# Patient Record
Sex: Female | Born: 1955 | Race: White | Hispanic: No | Marital: Single | Smoking: Current every day smoker
Health system: Southern US, Community
[De-identification: ages and names within clinical notes are randomized; demographics above are authoritative.]

## PROBLEM LIST (undated history)

## (undated) DIAGNOSIS — F419 Anxiety disorder, unspecified: Secondary | ICD-10-CM

## (undated) DIAGNOSIS — I4891 Unspecified atrial fibrillation: Secondary | ICD-10-CM

## (undated) DIAGNOSIS — I499 Cardiac arrhythmia, unspecified: Secondary | ICD-10-CM

## (undated) DIAGNOSIS — I10 Essential (primary) hypertension: Secondary | ICD-10-CM

## (undated) DIAGNOSIS — R519 Headache, unspecified: Secondary | ICD-10-CM

## (undated) DIAGNOSIS — E7800 Pure hypercholesterolemia, unspecified: Secondary | ICD-10-CM

## (undated) DIAGNOSIS — J449 Chronic obstructive pulmonary disease, unspecified: Secondary | ICD-10-CM

## (undated) DIAGNOSIS — T8859XA Other complications of anesthesia, initial encounter: Secondary | ICD-10-CM

## (undated) DIAGNOSIS — F32A Depression, unspecified: Secondary | ICD-10-CM

## (undated) HISTORY — DX: Unspecified atrial fibrillation: I48.91

## (undated) HISTORY — PX: TONSILLECTOMY: SUR1361

## (undated) HISTORY — PX: TUBAL LIGATION: SHX77

## (undated) HISTORY — DX: Anxiety disorder, unspecified: F41.9

## (undated) HISTORY — DX: Depression, unspecified: F32.A

## (undated) HISTORY — DX: Pure hypercholesterolemia, unspecified: E78.00

## (undated) HISTORY — PX: HERNIA REPAIR: SHX51

## (undated) HISTORY — DX: Essential (primary) hypertension: I10

## (undated) MED FILL — Iron Sucrose Inj 20 MG/ML (Fe Equiv): INTRAVENOUS | Qty: 10 | Status: AC

---

## 2015-07-27 ENCOUNTER — Encounter: Payer: Self-pay | Admitting: Emergency Medicine

## 2015-07-27 DIAGNOSIS — F17200 Nicotine dependence, unspecified, uncomplicated: Secondary | ICD-10-CM | POA: Insufficient documentation

## 2015-07-27 DIAGNOSIS — H73892 Other specified disorders of tympanic membrane, left ear: Secondary | ICD-10-CM | POA: Insufficient documentation

## 2015-07-27 DIAGNOSIS — H6122 Impacted cerumen, left ear: Secondary | ICD-10-CM | POA: Insufficient documentation

## 2015-07-27 LAB — URINALYSIS COMPLETE WITH MICROSCOPIC (ARMC ONLY)
BACTERIA UA: NONE SEEN
Bilirubin Urine: NEGATIVE
GLUCOSE, UA: NEGATIVE mg/dL
HGB URINE DIPSTICK: NEGATIVE
KETONES UR: NEGATIVE mg/dL
LEUKOCYTES UA: NEGATIVE
NITRITE: NEGATIVE
Protein, ur: NEGATIVE mg/dL
SPECIFIC GRAVITY, URINE: 1.016 (ref 1.005–1.030)
pH: 7 (ref 5.0–8.0)

## 2015-07-27 LAB — BASIC METABOLIC PANEL
ANION GAP: 8 (ref 5–15)
BUN: 18 mg/dL (ref 6–20)
CALCIUM: 9.5 mg/dL (ref 8.9–10.3)
CO2: 26 mmol/L (ref 22–32)
Chloride: 106 mmol/L (ref 101–111)
Creatinine, Ser: 0.95 mg/dL (ref 0.44–1.00)
GFR calc Af Amer: >60 mL/min (ref >60)
Glucose, Bld: 109 mg/dL — ABNORMAL HIGH (ref 65–99)
POTASSIUM: 3.9 mmol/L (ref 3.5–5.1)
SODIUM: 140 mmol/L (ref 135–145)

## 2015-07-27 LAB — CBC
HEMATOCRIT: 41.9 % (ref 35.0–47.0)
HEMOGLOBIN: 14.7 g/dL (ref 12.0–16.0)
MCH: 30.6 pg (ref 26.0–34.0)
MCHC: 35 g/dL (ref 32.0–36.0)
MCV: 87.4 fL (ref 80.0–100.0)
Platelets: 242 10*3/uL (ref 150–440)
RBC: 4.8 MIL/uL (ref 3.80–5.20)
RDW: 13.7 % (ref 11.5–14.5)
WBC: 10.5 10*3/uL (ref 3.6–11.0)

## 2015-07-27 LAB — GLUCOSE, CAPILLARY: GLUCOSE-CAPILLARY: 113 mg/dL — AB (ref 65–99)

## 2015-07-27 NOTE — ED Notes (Signed)
Pt arrived to the ED accompanied by her daughter for complaints of dizziness, near syncope episode and ear pain. Pt states that this symptoms have been going on for the last 2 years and has never gotten it checked. Pt thinks that all this time there was something in her ear that might be contributing to the symptoms; Pt has not seen a Dr for it. Pt is AOx4 in no apparent distress.

## 2015-07-28 ENCOUNTER — Emergency Department
Admission: EM | Admit: 2015-07-28 | Discharge: 2015-07-28 | Disposition: A | Discharge status: Home / Self Care | Payer: Self-pay | Attending: Emergency Medicine | Admitting: Emergency Medicine

## 2015-07-28 DIAGNOSIS — R42 Dizziness and giddiness: Secondary | ICD-10-CM

## 2015-07-28 DIAGNOSIS — H6122 Impacted cerumen, left ear: Secondary | ICD-10-CM

## 2015-07-28 NOTE — Discharge Instructions (Signed)
Please seek medical attention for any high fevers, chest pain, shortness of breath, change in behavior, persistent vomiting, bloody stool or any other new or concerning symptoms. ° ° °Dizziness °Dizziness is a common problem. It makes you feel unsteady or lightheaded. You may feel like you are about to pass out (faint). Dizziness can lead to injury if you stumble or fall. Anyone can get dizzy, but dizziness is more common in older adults. This condition can be caused by a number of things, including: °· Medicines. °· Dehydration. °· Illness. °HOME CARE °Following these instructions may help with your condition: °Eating and Drinking °· Drink enough fluid to keep your pee (urine) clear or pale yellow. This helps to keep you from getting dehydrated. Try to drink more clear fluids, such as water. °· Do not drink alcohol. °· Limit how much caffeine you drink or eat if told by your doctor. °· Limit how much salt you drink or eat if told by your doctor. °Activity °· Avoid making quick movements. °¨ When you stand up from sitting in a chair, steady yourself until you feel okay. °¨ In the morning, first sit up on the side of the bed. When you feel okay, stand slowly while you hold onto something. Do this until you know that your balance is fine. °· Move your legs often if you need to stand in one place for a long time. Tighten and relax your muscles in your legs while you are standing. °· Do not drive or use heavy machinery if you feel dizzy. °· Avoid bending down if you feel dizzy. Place items in your home so that they are easy for you to reach without leaning over. °Lifestyle °· Do not use any tobacco products, including cigarettes, chewing tobacco, or electronic cigarettes. If you need help quitting, ask your doctor. °· Try to lower your stress level, such as with yoga or meditation. Talk with your doctor if you need help. °General Instructions °· Watch your dizziness for any changes. °· Take medicines only as told by  your doctor. Talk with your doctor if you think that your dizziness is caused by a medicine that you are taking. °· Tell a friend or a family member that you are feeling dizzy. If he or she notices any changes in your behavior, have this person call your doctor. °· Keep all follow-up visits as told by your doctor. This is important. °GET HELP IF: °· Your dizziness does not go away. °· Your dizziness or light-headedness gets worse. °· You feel sick to your stomach (nauseous). °· You have trouble hearing. °· You have new symptoms. °· You are unsteady on your feet or you feel like the room is spinning. °GET HELP RIGHT AWAY IF: °· You throw up (vomit) or have diarrhea and are unable to eat or drink anything. °· You have trouble: °¨ Talking. °¨ Walking. °¨ Swallowing. °¨ Using your arms, hands, or legs. °· You feel generally weak. °· You are not thinking clearly or you have trouble forming sentences. It may take a friend or family member to notice this. °· You have: °¨ Chest pain. °¨ Pain in your belly (abdomen). °¨ Shortness of breath. °¨ Sweating. °· Your vision changes. °· You are bleeding. °· You have a headache. °· You have neck pain or a stiff neck. °· You have a fever. °  °This information is not intended to replace advice given to you by your health care provider. Make sure you discuss any questions you   have with your health care provider. °  °Document Released: 12/22/2010 Document Revised: 05/19/2014 Document Reviewed: 12/29/2013 °Elsevier Interactive Patient Education ©2016 [employer] ° °

## 2015-07-28 NOTE — ED Provider Notes (Signed)
[facility] Emergency Department Provider Note    ____________________________________________  Time seen: ~0040  I have reviewed the triage vital signs and the nursing notes.   HISTORY  Chief Complaint Near Syncope and Otalgia   History limited by: Not Limited   HPI Kristin Haas is a 60 y.o. female who presents to the emergency department today because of concerns for dizziness, possible foreign body in her ear, near syncope episodes. The patient states that she's been having dizziness for a couple of years. She thinks that it all started after she got water in her ear. 6 months later she started having vertigo type symptoms. She has had to lie on the floor when they've gotten very bad. She says that she did pass out roughly 2 years ago and again roughly a year and a half ago. For the past 3 days she has been having increasing dizzy spells. She has not had any chest pain associated with this. She has not seen any medical provider for these symptoms. The patient's daughter has been trying to clean out her left ear. They're concerned that there is a black foreign body in that ear.   History reviewed. No pertinent past medical history.  There are no active problems to display for this patient.   Past Surgical History  Procedure Laterality Date  . Hernia repair      No current outpatient prescriptions on file.  Allergies Codeine  History reviewed. No pertinent family history.  Social History Social History  Substance Use Topics  . Smoking status: Heavy Tobacco Smoker  . Smokeless tobacco: None  . Alcohol Use: Yes    Review of Systems  Constitutional: Negative for fever. Cardiovascular: Negative for chest pain. Respiratory: Negative for shortness of breath. Gastrointestinal: Negative for abdominal pain, vomiting and diarrhea. Neurological: Negative for headaches, focal weakness or numbness.  10-point ROS otherwise  negative.  ____________________________________________   PHYSICAL EXAM:  VITAL SIGNS: ED Triage Vitals  Enc Vitals Group     BP 07/27/15 2041 143/83 mmHg     Pulse Rate 07/27/15 2041 87     Resp 07/27/15 2041 20     Temp 07/27/15 2041 98.3 F (36.8 C)     Temp Source 07/27/15 2041 Oral     SpO2 07/27/15 2041 97 %     Weight 07/27/15 2041 145 lb (65.772 kg)     Height 07/27/15 2041 5\' 5"  (1.651 m)     Head Cir --      Peak Flow --      Pain Score 07/27/15 2055 0   Constitutional: Alert and oriented. Well appearing and in no distress. Eyes: Conjunctivae are normal. PERRL. Normal extraocular movements. ENT   Head: Normocephalic and atraumatic.      Ears: Right ear wnl. Left ear with moderate amount of soft wax towards the tympanic membrane. Some erythema around the tympanic membrane. No obvious bulging or fluid behind the membrane. No debris in external canal.    Nose: No congestion/rhinnorhea.   Mouth/Throat: Mucous membranes are moist.   Neck: No stridor. Hematological/Lymphatic/Immunilogical: No cervical lymphadenopathy. Cardiovascular: Normal rate, regular rhythm.  No murmurs, rubs, or gallops. Respiratory: Normal respiratory effort without tachypnea nor retractions. Breath sounds are clear and equal bilaterally. No wheezes/rales/rhonchi. Gastrointestinal: Soft and nontender. No distention. There is no CVA tenderness. Genitourinary: Deferred Musculoskeletal: Normal range of motion in all extremities. No joint effusions.  No lower extremity tenderness nor edema. Neurologic:  Normal speech and language. No gross focal  neurologic deficits are appreciated.  Skin:  Skin is warm, dry and intact. No rash noted. Psychiatric: Mood and affect are normal. Speech and behavior are normal. Patient exhibits appropriate insight and judgment.  ____________________________________________    LABS (pertinent positives/negatives)  Na 140 K 3.9 Glu 109 WBC 10.5 Hgb 14.7 UA  wnl ____________________________________________   EKG   I, Phineas Semen, attending physician, personally viewed and interpreted this EKG  EKG Time: 2040 Rate: 83 Rhythm: normal sinus rhythm  Axis: normal Intervals: qtc 411 QRS: narrow ST changes: no st elevation Impression: normal ekg  ____________________________________________    RADIOLOGY  None  ____________________________________________   PROCEDURES  Procedure(s) performed: Cerumen debridement  Critical Care performed: No  Ceruminosis is noted on the left side.  Wax is removed by manual debridement. Instructions for home care to prevent wax buildup are given.  ____________________________________________   INITIAL IMPRESSION / ASSESSMENT AND PLAN / ED COURSE  Pertinent labs & imaging results that were available during my care of the patient were reviewed by me and considered in my medical decision making (see chart for details).  Patient presented to the emergency department today with multiple year history of vertigo type symptoms, concern for foreign body in the left ear. Exam just showed some wax in that ear. I did remove some wax and did not observe any foreign body. The tympanic membrane is mildly erythematous which is probably likely related to recent attempts to clean it. Had a discussion with the patient about these syncopal episodes. The last one was a urinary half ago. Think likely she would best be benefited from establishing care with primary care doctor. There was a troponin ordered from triage however given lack of chest pain I do not think that ACS would explain the patient's symptoms, especially given long history of symptoms. Will discharge with primary care and ENT follow-up.  ____________________________________________   FINAL CLINICAL IMPRESSION(S) / ED DIAGNOSES  Final diagnoses:  Dizziness  Cerumen debris on tympanic membrane of left ear     Note: This dictation was prepared  with Dragon dictation. Any transcriptional errors that result from this process are unintentional    Phineas Semen, MD 07/28/15 [id number]

## 2015-09-06 ENCOUNTER — Encounter: Payer: Self-pay | Admitting: Emergency Medicine

## 2015-09-06 ENCOUNTER — Emergency Department: Payer: Self-pay

## 2015-09-06 ENCOUNTER — Observation Stay
Admission: EM | Admit: 2015-09-06 | Discharge: 2015-09-07 | Disposition: A | Discharge status: Home / Self Care | Payer: Self-pay | Attending: Internal Medicine | Admitting: Internal Medicine

## 2015-09-06 ENCOUNTER — Ambulatory Visit
Admission: EM | Admit: 2015-09-06 | Discharge: 2015-09-06 | Disposition: A | Discharge status: Home / Self Care | Payer: Self-pay | Attending: Family Medicine | Admitting: Family Medicine

## 2015-09-06 DIAGNOSIS — Z885 Allergy status to narcotic agent status: Secondary | ICD-10-CM | POA: Insufficient documentation

## 2015-09-06 DIAGNOSIS — R51 Headache: Secondary | ICD-10-CM | POA: Insufficient documentation

## 2015-09-06 DIAGNOSIS — R55 Syncope and collapse: Secondary | ICD-10-CM | POA: Insufficient documentation

## 2015-09-06 DIAGNOSIS — Z8249 Family history of ischemic heart disease and other diseases of the circulatory system: Secondary | ICD-10-CM | POA: Insufficient documentation

## 2015-09-06 DIAGNOSIS — R002 Palpitations: Principal | ICD-10-CM | POA: Diagnosis present

## 2015-09-06 DIAGNOSIS — Z79899 Other long term (current) drug therapy: Secondary | ICD-10-CM | POA: Insufficient documentation

## 2015-09-06 DIAGNOSIS — R232 Flushing: Secondary | ICD-10-CM

## 2015-09-06 DIAGNOSIS — I10 Essential (primary) hypertension: Secondary | ICD-10-CM | POA: Insufficient documentation

## 2015-09-06 DIAGNOSIS — I639 Cerebral infarction, unspecified: Secondary | ICD-10-CM

## 2015-09-06 DIAGNOSIS — I071 Rheumatic tricuspid insufficiency: Secondary | ICD-10-CM | POA: Insufficient documentation

## 2015-09-06 DIAGNOSIS — R42 Dizziness and giddiness: Secondary | ICD-10-CM | POA: Insufficient documentation

## 2015-09-06 DIAGNOSIS — F17210 Nicotine dependence, cigarettes, uncomplicated: Secondary | ICD-10-CM | POA: Insufficient documentation

## 2015-09-06 DIAGNOSIS — F410 Panic disorder [episodic paroxysmal anxiety] without agoraphobia: Secondary | ICD-10-CM | POA: Insufficient documentation

## 2015-09-06 HISTORY — DX: Flushing: R23.2

## 2015-09-06 HISTORY — DX: Palpitations: R00.2

## 2015-09-06 LAB — CBC
HCT: 42.1 % (ref 35.0–47.0)
HEMOGLOBIN: 14.8 g/dL (ref 12.0–16.0)
MCH: 30.9 pg (ref 26.0–34.0)
MCHC: 35.1 g/dL (ref 32.0–36.0)
MCV: 88 fL (ref 80.0–100.0)
Platelets: 216 10*3/uL (ref 150–440)
RBC: 4.79 MIL/uL (ref 3.80–5.20)
RDW: 13.6 % (ref 11.5–14.5)
WBC: 10.2 10*3/uL (ref 3.6–11.0)

## 2015-09-06 LAB — BASIC METABOLIC PANEL
ANION GAP: 7 (ref 5–15)
BUN: 13 mg/dL (ref 6–20)
CHLORIDE: 106 mmol/L (ref 101–111)
CO2: 27 mmol/L (ref 22–32)
CREATININE: 0.6 mg/dL (ref 0.44–1.00)
Calcium: 9.2 mg/dL (ref 8.9–10.3)
GFR calc non Af Amer: >60 mL/min (ref >60)
GLUCOSE: 127 mg/dL — AB (ref 65–99)
Potassium: 4 mmol/L (ref 3.5–5.1)
Sodium: 140 mmol/L (ref 135–145)

## 2015-09-06 LAB — LIPID PANEL
CHOL/HDL RATIO: 4.6 ratio
CHOLESTEROL: 222 mg/dL — AB (ref 0–200)
HDL: 48 mg/dL (ref >40)
LDL Cholesterol: 145 mg/dL — ABNORMAL HIGH (ref 0–99)
TRIGLYCERIDES: 144 mg/dL (ref <150)
VLDL: 29 mg/dL (ref 0–40)

## 2015-09-06 LAB — URINALYSIS COMPLETE WITH MICROSCOPIC (ARMC ONLY)
Bacteria, UA: NONE SEEN
Bilirubin Urine: NEGATIVE
GLUCOSE, UA: NEGATIVE mg/dL
Hgb urine dipstick: NEGATIVE
Ketones, ur: NEGATIVE mg/dL
Leukocytes, UA: NEGATIVE
Nitrite: NEGATIVE
PROTEIN: NEGATIVE mg/dL
Specific Gravity, Urine: 1.009 (ref 1.005–1.030)
pH: 6 (ref 5.0–8.0)

## 2015-09-06 LAB — GLUCOSE, CAPILLARY: Glucose-Capillary: 88 mg/dL (ref 65–99)

## 2015-09-06 LAB — TROPONIN I: Troponin I: <0.03 ng/mL (ref <0.03)

## 2015-09-06 MED ORDER — NICOTINE 21 MG/24HR TD PT24
21.0000 mg | MEDICATED_PATCH | Freq: Every day | TRANSDERMAL | Status: DC
  Administered 2015-09-06 – 2015-09-07 (×2): 21 mg via TRANSDERMAL
  Filled 2015-09-06 (×2): qty 1

## 2015-09-06 MED ORDER — PSEUDOEPHEDRINE HCL ER 120 MG PO TB12
120.0000 mg | ORAL_TABLET | Freq: Every day | ORAL | Status: DC
  Filled 2015-09-06 (×3): qty 1

## 2015-09-06 MED ORDER — ASPIRIN 81 MG PO CHEW
81.0000 mg | CHEWABLE_TABLET | Freq: Once | ORAL | Status: AC
  Administered 2015-09-06: 81 mg via ORAL
  Filled 2015-09-06: qty 1

## 2015-09-06 MED ORDER — HEPARIN SODIUM (PORCINE) 5000 UNIT/ML IJ SOLN
5000.0000 [IU] | Freq: Three times a day (TID) | INTRAMUSCULAR | Status: DC
  Administered 2015-09-06 – 2015-09-07 (×2): 5000 [IU] via SUBCUTANEOUS
  Filled 2015-09-06 (×2): qty 1

## 2015-09-06 MED ORDER — SODIUM CHLORIDE 0.9% FLUSH
3.0000 mL | Freq: Two times a day (BID) | INTRAVENOUS | Status: DC
  Administered 2015-09-06 – 2015-09-07 (×2): 3 mL via INTRAVENOUS

## 2015-09-06 NOTE — Progress Notes (Signed)
Pt admitted to rm 242. Family at bedside. She is painfree. Understands to call for assist to br.oriented to room.

## 2015-09-06 NOTE — ED Notes (Signed)
Pt denies any chest pain, passed FAST stroke exam.

## 2015-09-06 NOTE — ED Provider Notes (Signed)
[facility] ____________________________________________  Time seen: Approximately 1300 PM  I have reviewed the triage vital signs and the nursing notes.   HISTORY  Chief Complaint Near Syncope   HPI Kristin Haas is a 60 y.o. female presenting with daughter at bedside for report of intermittent episodes of near syncope. Patient describes these episodes reoccurring over the last 2 years, usually 2-3 episodes per month however reports yesterday episodes were near continuous. Patient further describes these episodes as a flushed feeling in her head, heart starts to race and has a fluttering feeling in her chest, and then her vision starts to go black. Patient reports she has had a history of passing out with these episodes twice before with last being in 2016. Patient reports that she normally can sit herself down, breathing and the complaints would eventually resolve. Patient states she has been unable to determine the trigger for these events. Patient questions if she is having anxiety attacks but is unable to associate triggers. Reports mild headache at this time.   Patient reports she has since tried to eliminate caffeine as well as gluten to see if this is with associated without positive results. Patient reports when she checks her blood pressure during an episode her blood pressure was elevated with 197/94 yesterday. Patient reports that she has been seen once in the emergency room for similar complaint approximately one month ago but reports she has not been evaluated otherwise. Patient reports she is currently working to be established by her primary care physician but has not been seen by her primary physician in several years.  Denies any fall or head trauma. Denies any recent sickness, fevers, hospitalizations. Reports has continued working recently. Patient reports she has not had any in the same episodes today. Patient denies any complaints at this current time. Denies  any recent medication changes.   History reviewed. No pertinent past medical history.  There are no active problems to display for this patient.   Past Surgical History:  Procedure Laterality Date  . HERNIA REPAIR    . TONSILLECTOMY    . TUBAL LIGATION     No current facility-administered medications for this encounter.   Current Outpatient Prescriptions:  .  DimenhyDRINATE (DRAMAMINE PO), Take by mouth., Disp: , Rfl:  .  ibuprofen (ADVIL,MOTRIN) 600 MG tablet, Take 600 mg by mouth every 6 (six) hours as needed., Disp: , Rfl:   Allergies Codeine  family history. Daughter: anxiety, arrhythmia   Social History Social History  Substance Use Topics  . Smoking status: Heavy Tobacco Smoker    Packs/day: 1.00    Types: Cigarettes  . Smokeless tobacco: Never Used  . Alcohol use Yes    Review of Systems Constitutional: No fever/chills Eyes: As above. ENT: No sore throat. Cardiovascular: Denies chest pain. Respiratory: Denies shortness of breath. Gastrointestinal: No abdominal pain.  No nausea, no vomiting.  No diarrhea.  No constipation. Genitourinary: Negative for dysuria. Musculoskeletal: Negative for back pain. Skin: Negative for rash. Neurological: Negative for  focal weakness or numbness.  10-point ROS otherwise negative.  ____________________________________________   PHYSICAL EXAM:  VITAL SIGNS: ED Triage Vitals  Enc Vitals Group     BP 09/06/15 1141 (!) 155/90     Pulse Rate 09/06/15 1141 83     Resp 09/06/15 1141 18     Temp 09/06/15 1141 98.2 F (36.8 C)     Temp Source 09/06/15 1141 Oral     SpO2 09/06/15 1141 100 %  Weight 09/06/15 1141 150 lb (68 kg)     Height 09/06/15 1141 5\' 5"  (1.651 m)     Head Circumference --      Peak Flow --      Pain Score 09/06/15 1144 0     Pain Loc --      Pain Edu? --      Excl. in GC? --     Constitutional: Alert and oriented. Well appearing and in no acute distress. Eyes: Conjunctivae are normal.  PERRL. EOMI. ENT      Head: Normocephalic and atraumatic.      Ears: Left: cerumen impaction, nontender, no erythema. Right: nontender, no erythema, normal TM.       Nose: No congestion/rhinnorhea.      Mouth/Throat: Mucous membranes are moist.Oropharynx non-erythematous. Neck: No stridor. Supple without meningismus.  Hematological/Lymphatic/Immunilogical: No cervical lymphadenopathy. Cardiovascular: Normal rate, regular rhythm. Grossly normal heart sounds.  Good peripheral circulation. Respiratory: Normal respiratory effort without tachypnea nor retractions. Breath sounds are clear and equal bilaterally. No wheezes/rales/rhonchi.. Gastrointestinal: Soft and nontender. No distention.  Musculoskeletal:  Nontender with normal range of motion in all extremities. No midline cervical, thoracic or lumbar tenderness to palpation. Bilateral pedal pulses equal and easily palpated. Neurologic:  Normal speech and language. No gross focal neurologic deficits are appreciated. Speech is normal. No gait instability. 5/5 strength to bilateral upper and lower extremities.  Skin:  Skin is warm, dry and intact. No rash noted. Psychiatric: Mood and affect are normal. Speech and behavior are normal. Patient exhibits appropriate insight and judgment   ___________________________________________   LABS (all labs ordered are listed, but only abnormal results are displayed)  Labs Reviewed  GLUCOSE, CAPILLARY  CBG MONITORING, ED   ____________________________________________  EKG  ED ECG REPORT I, Renford DillsLindsey Jenica Costilow, the attending provider, personally viewed and interpreted this ECG.   Date: 09/06/2015  EKG Time: 1144  Rate: 75  Rhythm: sinus rhythm with possible premature atrial complexes  Axis: normal  Intervals:none  ST&T Change: none  ____________________________________________  RADIOLOGY  No results found. ____________________________________________   PROCEDURES Procedures    INITIAL  IMPRESSION / ASSESSMENT AND PLAN / ED COURSE  Pertinent labs & imaging results that were available during my care of the patient were reviewed by me and considered in my medical decision making (see chart for details).  Overall well-appearing patient. Presenting with daughter at bedside for evaluation of complaints that has been intermittent over the last 2 years that has not yet been followed by a primary physician. Patient expresses concern that yesterday symptoms were much more frequent in their occurrence. Declines any previous imaging such as CT head or cardiac evaluation. No focal neurological deficits. Discussed in detail with patient and daughter as symptoms could have various etiologies sources, unable to determine at this time however discuss multiple differentials such as neurologic, cardiac, anxiety or panic attack or other with patient and family. As patient expresses concern of yesterday's events occurring more frequently, recommend this time for patient to be further evaluated in emergency room of her choice including likely CT head and cardiac evaluation. Patient alert and oriented with decisional capacity and states that her daughter will drive her to the ER, declines EMS transportation. Patient stable at the time of discharge and transfer. Called Noreene LarssonJill  RN charge nurse at [facility] and report was given.  Discussed follow up with Primary care physician this week. Discussed follow up and return parameters including no resolution or any worsening concerns. Patient verbalized understanding  and agreed to plan.   ____________________________________________   FINAL CLINICAL IMPRESSION(S) / ED DIAGNOSES  Final diagnoses:  Near syncope     Discharge Medication List as of 09/06/2015 12:24 PM      Note: This dictation was prepared with Dragon dictation along with smaller phrase technology. Any transcriptional errors that result from this process are unintentional.    Clinical  Course       Renford Dills, NP 09/06/15 Rickey Primus

## 2015-09-06 NOTE — ED Triage Notes (Signed)
Pt says she has episodes of felling head full and like something coming over her and like going to pass out since 2015--but in past several days these episodes have been increasing in frequency--had 18 yesterday.  [facility] and they sent her here--they did ekg and finger stick there.

## 2015-09-06 NOTE — ED Triage Notes (Addendum)
Patient is complaining of having near passing out episodes. She states everything goes black and she has been checking her blood pressure when it happens and it has been elevated, she has a blood rushing to head feeling, also feels like she is being choked. Patient says after these episodes she has a really bad headache.

## 2015-09-06 NOTE — ED Provider Notes (Signed)
[facility] Emergency Department Provider Note ____________________________________________   I have reviewed the triage vital signs and the triage nursing note.  HISTORY  Chief Complaint Near Syncope   Historian Patient and daughter  HPI Kristin Haas is a 60 y.o. female who is brought in today essentially by her daughter's request because she has had several episodes of near syncope. It sounds like she's had 2 or 3 episodes over the past year, but had 2 episodes this weekend, including one today. Symptoms seemed to start when she becomes anxious, and start with a pressure in the chest and palpitations which turns to flushing across the face and then her vision becomes black and she nearly passes out. This has happened while driving. She denies weakness or numbness. She had a headache after this episode for several hours. She's not been evaluated for this because she is new to the area and does not have a primary care physician yet.  Currently no chest pain or headache. No weakness or numbness.    History reviewed. No pertinent past medical history.  There are no active problems to display for this patient.   Past Surgical History:  Procedure Laterality Date  . HERNIA REPAIR    . TONSILLECTOMY    . TUBAL LIGATION      Prior to Admission medications   Medication Sig Start Date End Date Taking? Authorizing Provider  pseudoephedrine (SUDAFED) 120 MG 12 hr tablet Take 120 mg by mouth daily.   Yes Historical Provider, MD  DimenhyDRINATE (DRAMAMINE PO) Take by mouth.    Historical Provider, MD  ibuprofen (ADVIL,MOTRIN) 600 MG tablet Take 600 mg by mouth every 6 (six) hours as needed.    Historical Provider, MD    Allergies  Allergen Reactions  . Codeine Nausea And Vomiting    No family history on file.  Social History Social History  Substance Use Topics  . Smoking status: Heavy Tobacco Smoker    Packs/day: 1.00    Types: Cigarettes  . Smokeless  tobacco: Never Used  . Alcohol use Yes    Review of Systems  Constitutional: Negative for fever. Eyes: Negative for visual changes. ENT: Negative for sore throat. Cardiovascular: Negative for chest pain.  Positive for palpitations and flushing which are now gone. Respiratory: Negative for shortness of breath. Gastrointestinal: Negative for abdominal pain, vomiting and diarrhea. Genitourinary: Negative for dysuria. Musculoskeletal: Negative for back pain. Skin: Negative for rash. Neurological: Negative for headache. 10 point Review of Systems otherwise negative ____________________________________________   PHYSICAL EXAM:  VITAL SIGNS: ED Triage Vitals  Enc Vitals Group     BP 09/06/15 1320 (!) 161/89     Pulse Rate 09/06/15 1320 78     Resp 09/06/15 1320 16     Temp 09/06/15 1320 98.1 F (36.7 C)     Temp Source 09/06/15 1320 Oral     SpO2 09/06/15 1320 100 %     Weight 09/06/15 1321 150 lb (68 kg)     Height 09/06/15 1321 5\' 6"  (1.676 m)     Head Circumference --      Peak Flow --      Pain Score 09/06/15 1322 3     Pain Loc --      Pain Edu? --      Excl. in GC? --      Constitutional: Alert and oriented. Well appearing and in no distress. HEENT   Head: Normocephalic and atraumatic.      Eyes: Conjunctivae  are normal. PERRL. Normal extraocular movements.      Ears:         Nose: No congestion/rhinnorhea.   Mouth/Throat: Mucous membranes are moist.   Neck: No stridor. Cardiovascular/Chest: Normal rate, regular rhythm.  No murmurs, rubs, or gallops. Respiratory: Normal respiratory effort without tachypnea nor retractions. Breath sounds are clear and equal bilaterally. No wheezes/rales/rhonchi. Gastrointestinal: Soft. No distention, no guarding, no rebound. Nontender.    Genitourinary/rectal:Deferred Musculoskeletal: Nontender with normal range of motion in all extremities. No joint effusions.  No lower extremity tenderness.  No edema. Neurologic:   Cranial nerves II through X intact. No slurred speech or aphasia. Normal speech and language. No gross or focal neurologic deficits are appreciated. Skin:  Skin is warm, dry and intact. No rash noted. Psychiatric: Mood and affect are normal. Speech and behavior are normal. Patient exhibits appropriate insight and judgment.  She does seem somewhat anxious.  ____________________________________________   EKG I, Governor Rooksebecca Eliezer Khawaja, MD, the attending physician have personally viewed and interpreted all ECGs.  70 bpm. Normal sinus rhythm. Incomplete right bundle branch block. Normal axis. Nonspecific ST and T-wave ____________________________________________  LABS (pertinent positives/negatives)  Labs Reviewed  BASIC METABOLIC PANEL - Abnormal; Notable for the following:       Result Value   Glucose, Bld 127 (*)    All other components within normal limits  URINALYSIS COMPLETEWITH MICROSCOPIC ([facility] ONLY) - Abnormal; Notable for the following:    Color, Urine STRAW (*)    APPearance CLEAR (*)    Squamous Epithelial / LPF 0-5 (*)    All other components within normal limits  CBC  TROPONIN I    ____________________________________________  RADIOLOGY All Xrays were viewed by me. Imaging interpreted by Radiologist.  Ct head without contrast:  IMPRESSION: No intracranial hemorrhage, mass effect or midline shift. No acute cortical infarction. No mass lesion is noted on this unenhanced scan. Axial image 9 there is focal area of decreased attenuation in right basal ganglia measures 9 mm. This may be due to ischemia of indeterminate age. If recent ischemia is suspected further evaluation with MRI with diffusion imaging could be performed.  These results were called by telephone at the time of interpretation on 09/06/2015 at 3:07 pm to Dr. Shaune PollackLord , who verbally acknowledged these results. __________________________________________  PROCEDURES  Procedure(s) performed: None  Critical Care  performed: None  ____________________________________________   ED COURSE / ASSESSMENT AND PLAN  Pertinent labs & imaging results that were available during my care of the patient were reviewed by me and considered in my medical decision making (see chart for details).  Unclear etiology of episodes where the patient has palpitations, flushing, elevated blood pressure, dimming of vision and near syncope which are now increasing in frequency.  Consider cardiac versus neurologic.  However, no specific cardiac abnormality is or high risk features, similarly intact neurologic exam.  She does state that she is under a lot of stress, and the symptoms do set him to the car when she's having stress. She recently moved here due to separation, and started a new job.   Head CT result was called to me by the radiologist with an area concerning for possible stroke, potentially subacute and recommending MRI.  When I went back to discuss the head CT result and the plan for hospital admission and MRI, daughter states that actually her mother has had issues with mixing up words this weekend, instead of saying cheese she said shoes. The patient said that she stated  she was because they had been having a conversation talking about shoes.    CONSULTATIONS:   Hospitalist, Dr. Desiree HaneVachani, for admission.   Patient / Family / Caregiver informed of clinical course, medical decision-making process, and agree with plan.    ___________________________________________   FINAL CLINICAL IMPRESSION(S) / ED DIAGNOSES   Final diagnoses:  Near syncope  Heart palpitations              Note: This dictation was prepared with Dragon dictation. Any transcriptional errors that result from this process are unintentional    Governor Rooksebecca Dillon [place], MD 09/06/15 [phone]

## 2015-09-06 NOTE — Progress Notes (Signed)
Resident requested Nicotine patch.This Clinical research associatewriter called MD for order. Order received for one 21 mg patch every 24 hrs. Patch applied at 21:30 to right shoulder.

## 2015-09-06 NOTE — H&P (Signed)
[employer] [facility] [place] [facility]   PATIENT NAME: Kristin Haas    MR#:  [id number]  DATE OF BIRTH:  Dec 05, 1955  DATE OF ADMISSION:  09/06/2015  PRIMARY CARE PHYSICIAN: [facility]   REQUESTING/REFERRING PHYSICIAN: lord  CHIEF COMPLAINT:   Chief Complaint  Patient presents with  . Near Syncope    HISTORY OF PRESENT ILLNESS:  Kristin Haas  is a 60 y.o. female with a known history of No known medical issue as she does not go to a doctor anytime, is having episodes of palpitation, headache, flushing and excessive sweating, feeling dizzy and blacked out with these episodes. This episodes started 2 years ago but initially it was once in every few months and it was lasting just for 1-2 minutes. For last 3-4 days it started coming multiple times per day and so finally see decided to come to emergency room today for having further workup. She denies any urinary symptoms, denies any drug use or excessive caffeinated drinks.  PAST MEDICAL HISTORY:  History reviewed. No pertinent past medical history. Patient does not go to doctors so she is not aware about any medical history. PAST SURGICAL HISTORY:   Past Surgical History:  Procedure Laterality Date  . HERNIA REPAIR    . TONSILLECTOMY    . TUBAL LIGATION      SOCIAL HISTORY:   Social History  Substance Use Topics  . Smoking status: Heavy Tobacco Smoker    Packs/day: 1.00    Types: Cigarettes  . Smokeless tobacco: Never Used  . Alcohol use Yes    FAMILY HISTORY:   Family History  Problem Relation Age of Onset  . Hypertension Father   . Hypertension Sister     DRUG ALLERGIES:   Allergies  Allergen Reactions  . Codeine Nausea And Vomiting    REVIEW OF SYSTEMS:   Review of Systems  Constitutional: Negative for fever and weight loss.  HENT: Negative for congestion, ear pain, hearing loss and sore throat.   Eyes: Negative for blurred vision and double vision.  Respiratory: Negative  for cough, sputum production and shortness of breath.   Cardiovascular: Positive for palpitations. Negative for chest pain, orthopnea and leg swelling.  Gastrointestinal: Negative for abdominal pain, diarrhea, nausea and vomiting.  Genitourinary: Negative for dysuria and frequency.  Musculoskeletal: Negative for myalgias.  Skin: Negative for rash.  Neurological: Positive for headaches. Negative for dizziness, speech change, focal weakness, seizures, loss of consciousness and weakness.  Psychiatric/Behavioral: Negative for depression.    MEDICATIONS AT HOME:   Prior to Admission medications   Medication Sig Start Date End Date Taking? Authorizing Provider  pseudoephedrine (SUDAFED) 120 MG 12 hr tablet Take 120 mg by mouth daily.   Yes Historical Provider, MD  DimenhyDRINATE (DRAMAMINE PO) Take by mouth.    Historical Provider, MD  ibuprofen (ADVIL,MOTRIN) 600 MG tablet Take 600 mg by mouth every 6 (six) hours as needed.    Historical Provider, MD      VITAL SIGNS:  Blood pressure 136/86, pulse 62, temperature 98.1 F (36.7 C), temperature source Oral, resp. rate 18, height 5\' 6"  (1.676 m), weight 68 kg (150 lb), SpO2 99 %.  PHYSICAL EXAMINATION:  Physical Exam  GENERAL:  60 y.o.-year-old patient lying in the bed with no acute distress.  EYES: Pupils equal, round, reactive to light and accommodation. No scleral icterus. Extraocular muscles intact.  HEENT: Head atraumatic, normocephalic. Oropharynx and nasopharynx clear.  NECK:  Supple, no jugular venous distention. No  thyroid enlargement, no tenderness.  LUNGS: Normal breath sounds bilaterally, no wheezing, rales,rhonchi or crepitation. No use of accessory muscles of respiration.  CARDIOVASCULAR: S1, S2 normal. No murmurs, rubs, or gallops.  ABDOMEN: Soft, nontender, nondistended. Bowel sounds present. No organomegaly or mass.  EXTREMITIES: No pedal edema, cyanosis, or clubbing.  NEUROLOGIC: Cranial nerves II through XII are intact.  Muscle strength 5/5 in all extremities. Sensation intact. Gait not checked.  PSYCHIATRIC: The patient is alert and oriented x 3.  SKIN: No obvious rash, lesion, or ulcer.   LABORATORY PANEL:   CBC  Recent Labs Lab 09/06/15 1324  WBC 10.2  HGB 14.8  HCT 42.1  PLT 216   ------------------------------------------------------------------------------------------------------------------  Chemistries   Recent Labs Lab 09/06/15 1324  NA 140  K 4.0  CL 106  CO2 27  GLUCOSE 127*  BUN 13  CREATININE 0.60  CALCIUM 9.2   ------------------------------------------------------------------------------------------------------------------  Cardiac Enzymes  Recent Labs Lab 09/06/15 1324  TROPONINI <0.03   ------------------------------------------------------------------------------------------------------------------  RADIOLOGY:  Ct Head Wo Contrast  Result Date: 09/06/2015 CLINICAL DATA:  Near syncope EXAM: CT HEAD WITHOUT CONTRAST TECHNIQUE: Contiguous axial images were obtained from the base of the skull through the vertex without intravenous contrast. COMPARISON:  None. FINDINGS: Brain: No intracranial hemorrhage, mass effect or midline shift. Mild cerebral atrophy. No acute cortical infarction. No mass lesion is noted on this unenhanced scan. Axial image 9 there is focal area of decreased attenuation in right basal ganglia measures 9 mm. This may be due to ischemia of indeterminate age. If recent ischemia is suspected further evaluation with MRI with diffusion imaging could be performed. Vascular: Atherosclerotic calcifications of carotid siphon. Skull: No skull fracture is noted. Sinuses/Orbits: Paranasal sinuses and mastoid air cells are unremarkable. Other: None IMPRESSION: No intracranial hemorrhage, mass effect or midline shift. No acute cortical infarction. No mass lesion is noted on this unenhanced scan. Axial image 9 there is focal area of decreased attenuation in right basal  ganglia measures 9 mm. This may be due to ischemia of indeterminate age. If recent ischemia is suspected further evaluation with MRI with diffusion imaging could be performed. These results were called by telephone at the time of interpretation on 09/06/2015 at 3:07 pm to Dr. Shaune PollackLord , who verbally acknowledged these results. Electronically Signed   By: Natasha MeadLiviu  Pop M.D.   On: 09/06/2015 15:08      IMPRESSION AND PLAN:   * Episode at palpitation, dizziness, excessive sweating, headache and hypertension   Highly suggestive of pheochromocytoma. Or it may be panic attacks.   We will check for urinary and plasma metanephrines, get endocrinology consult.  * Hypertension   Currently wait for further diagnosis and treatment options depend on the results of above.   As she is not going to any doctor will also check lipid panel and hemoglobin A1c.  * Possibility of stroke as per CT scan results   I highly doubt this presentation is secondary to a stroke but a CT scan is questionable I will do an MRI on the brain.  * Active smoking   Counseled to quit smoking for 4 minutes and offered nicotine patch.       All the records are reviewed and case discussed with ED provider. Management plans discussed with the patient, family and they are in agreement.  CODE STATUS: full.  TOTAL TIME TAKING CARE OF THIS PATIENT: 45 minutes.    Altamese DillingVACHHANI, Sommer Spickard M.D on 09/06/2015 at 5:28 PM  Between 7am to 6pm -  Pager - [phone]  After 6pm go to [email or web address] - Social research officer, governmentpassword [email or web address] [email or web address]  [facility]  Office  [phone]  CC: Primary care physician; [facility]   Note: This dictation was prepared with Dragon dictation along with smaller phrase technology. Any transcriptional errors that result from this process are unintentional.

## 2015-09-06 NOTE — Discharge Instructions (Signed)
Go directly to ER  

## 2015-09-07 ENCOUNTER — Observation Stay: Payer: Self-pay

## 2015-09-07 ENCOUNTER — Observation Stay (HOSPITAL_BASED_OUTPATIENT_CLINIC_OR_DEPARTMENT_OTHER)
Admit: 2015-09-07 | Discharge: 2015-09-07 | Disposition: A | Discharge status: Home / Self Care | Payer: Self-pay | Attending: Internal Medicine | Admitting: Internal Medicine

## 2015-09-07 DIAGNOSIS — R002 Palpitations: Secondary | ICD-10-CM

## 2015-09-07 LAB — CBC
HEMATOCRIT: 40.3 % (ref 35.0–47.0)
Hemoglobin: 14.2 g/dL (ref 12.0–16.0)
MCH: 30.9 pg (ref 26.0–34.0)
MCHC: 35.3 g/dL (ref 32.0–36.0)
MCV: 87.5 fL (ref 80.0–100.0)
Platelets: 201 10*3/uL (ref 150–440)
RBC: 4.6 MIL/uL (ref 3.80–5.20)
RDW: 13.4 % (ref 11.5–14.5)
WBC: 7.9 10*3/uL (ref 3.6–11.0)

## 2015-09-07 LAB — BASIC METABOLIC PANEL
Anion gap: 5 (ref 5–15)
BUN: 13 mg/dL (ref 6–20)
CALCIUM: 8.7 mg/dL — AB (ref 8.9–10.3)
CO2: 26 mmol/L (ref 22–32)
Chloride: 110 mmol/L (ref 101–111)
Creatinine, Ser: 0.61 mg/dL (ref 0.44–1.00)
GFR calc Af Amer: >60 mL/min (ref >60)
GLUCOSE: 106 mg/dL — AB (ref 65–99)
POTASSIUM: 4.2 mmol/L (ref 3.5–5.1)
SODIUM: 141 mmol/L (ref 135–145)

## 2015-09-07 LAB — TSH: TSH: 4.06 u[IU]/mL (ref 0.350–4.500)

## 2015-09-07 LAB — ECHOCARDIOGRAM COMPLETE
HEIGHTINCHES: 66 in
WEIGHTICAEL: 2345.6 [oz_av]

## 2015-09-07 LAB — HEMOGLOBIN A1C: HEMOGLOBIN A1C: 5.3 % (ref 4.0–6.0)

## 2015-09-07 MED ORDER — NICOTINE 21 MG/24HR TD PT24: 21.0000 mg | MEDICATED_PATCH | Freq: Every day | TRANSDERMAL | 0 refills | Status: DC

## 2015-09-07 MED ORDER — ACETAMINOPHEN 325 MG PO TABS
650.0000 mg | ORAL_TABLET | Freq: Four times a day (QID) | ORAL | Status: DC | PRN
  Administered 2015-09-07: 650 mg via ORAL
  Filled 2015-09-07: qty 2

## 2015-09-07 NOTE — Discharge Summary (Signed)
[employer] [facility] [place] [facility]   PATIENT NAME: Kristin Haas    MR#:  [id number]  DATE OF BIRTH:  1955-04-24  DATE OF ADMISSION:  09/06/2015 ADMITTING PHYSICIAN: Altamese DillingVaibhavkumar Vachhani, MD  DATE OF DISCHARGE: 09/07/2015  PRIMARY CARE PHYSICIAN: No primary care provider on file.    ADMISSION DIAGNOSIS:  Heart palpitations [R00.2] CVA (cerebral infarction) [I63.9] Near syncope [R55]  DISCHARGE DIAGNOSIS:  Principal Problem:   Flushing Active Problems:   Palpitation   SECONDARY DIAGNOSIS:  History reviewed. No pertinent past medical history.  HOSPITAL COURSE:    60 year old female with a history of tobacco dependence who presents with symptoms of flushing, palpitation and near syncope which has occurred on several occasions over the past few years.  1. Palpitations with flushing: Telemetry showed no acute abnormal rhythm. She underwent echocardiogram which essentially was normal. TSH was within normal limits. She would benefit from outpatient follow-up with endocrinology if this continues. She was initially started on 24-hour urine collection for VMA and metanephrines to evaluate for Pheo, however this would best be done in an outpatient setting. Differential also includes panic attacks. She was also elevated initially however her blood pressure was less than 140/90 during most of her hospital stay. I also discontinue Sudafed. She will be scheduled to see Dr Welton FlakesKhan, cardiology as an outpatient and would benefit from cardiac monitoring.   2. Tobacco dependence Patient was encouraged to stop smoking. Patient counseled for 3 minutes.    DISCHARGE CONDITIONS AND DIET:  Stable for discharge home on regular diet  CONSULTS OBTAINED:    DRUG ALLERGIES:   Allergies  Allergen Reactions  . Codeine Nausea And Vomiting    DISCHARGE MEDICATIONS:   Current Discharge Medication List    START taking these medications   Details  nicotine (NICODERM CQ -  DOSED IN MG/24 HOURS) 21 mg/24hr patch Place 1 patch (21 mg total) onto the skin daily. Qty: 28 patch, Refills: 0      CONTINUE these medications which have NOT CHANGED   Details  ibuprofen (ADVIL,MOTRIN) 600 MG tablet Take 600 mg by mouth every 6 (six) hours as needed.      STOP taking these medications     pseudoephedrine (SUDAFED) 120 MG 12 hr tablet      DimenhyDRINATE (DRAMAMINE PO)               Today   CHIEF COMPLAINT:  Doing well this am no symptoms of flushing, dizziness, near syncope.   VITAL SIGNS:  Blood pressure 132/65, pulse 65, temperature 97.9 F (36.6 C), temperature source Oral, resp. rate 15, height 5\' 6"  (1.676 m), weight 66.5 kg (146 lb 9.6 oz), SpO2 96 %.   REVIEW OF SYSTEMS:  Review of Systems  Constitutional: Negative.  Negative for chills, fever and malaise/fatigue.  HENT: Negative.  Negative for ear discharge, ear pain, hearing loss, nosebleeds and sore throat.   Eyes: Negative.  Negative for blurred vision and pain.  Respiratory: Negative.  Negative for cough, hemoptysis, shortness of breath and wheezing.   Cardiovascular: Negative.  Negative for chest pain, palpitations and leg swelling.  Gastrointestinal: Negative.  Negative for abdominal pain, blood in stool, diarrhea, nausea and vomiting.  Genitourinary: Negative.  Negative for dysuria.  Musculoskeletal: Negative.  Negative for back pain.  Skin: Negative.   Neurological: Negative for dizziness, tremors, speech change, focal weakness, seizures and headaches.  Endo/Heme/Allergies: Negative.  Does not bruise/bleed easily.  Psychiatric/Behavioral: Negative.  Negative for depression, hallucinations and  suicidal ideas.     PHYSICAL EXAMINATION:  GENERAL:  60 y.o.-year-old patient lying in the bed with no acute distress.  NECK:  Supple, no jugular venous distention. No thyroid enlargement, no tenderness.  LUNGS: Normal breath sounds bilaterally, no wheezing, rales,rhonchi  No use of  accessory muscles of respiration.  CARDIOVASCULAR: S1, S2 normal. No murmurs, rubs, or gallops.  ABDOMEN: Soft, non-tender, non-distended. Bowel sounds present. No organomegaly or mass.  EXTREMITIES: No pedal edema, cyanosis, or clubbing.  PSYCHIATRIC: The patient is alert and oriented x 3.  SKIN: No obvious rash, lesion, or ulcer.   DATA REVIEW:   CBC  Recent Labs Lab 09/07/15 0426  WBC 7.9  HGB 14.2  HCT 40.3  PLT 201    Chemistries   Recent Labs Lab 09/07/15 0426  NA 141  K 4.2  CL 110  CO2 26  GLUCOSE 106*  BUN 13  CREATININE 0.61  CALCIUM 8.7*    Cardiac Enzymes  Recent Labs Lab 09/06/15 1324  TROPONINI <0.03    Microbiology Results  @[email or web address] @  RADIOLOGY:  Ct Head Wo Contrast  Result Date: 09/06/2015 CLINICAL DATA:  Near syncope EXAM: CT HEAD WITHOUT CONTRAST TECHNIQUE: Contiguous axial images were obtained from the base of the skull through the vertex without intravenous contrast. COMPARISON:  None. FINDINGS: Brain: No intracranial hemorrhage, mass effect or midline shift. Mild cerebral atrophy. No acute cortical infarction. No mass lesion is noted on this unenhanced scan. Axial image 9 there is focal area of decreased attenuation in right basal ganglia measures 9 mm. This may be due to ischemia of indeterminate age. If recent ischemia is suspected further evaluation with MRI with diffusion imaging could be performed. Vascular: Atherosclerotic calcifications of carotid siphon. Skull: No skull fracture is noted. Sinuses/Orbits: Paranasal sinuses and mastoid air cells are unremarkable. Other: None IMPRESSION: No intracranial hemorrhage, mass effect or midline shift. No acute cortical infarction. No mass lesion is noted on this unenhanced scan. Axial image 9 there is focal area of decreased attenuation in right basal ganglia measures 9 mm. This may be due to ischemia of indeterminate age. If recent ischemia is suspected further evaluation with MRI with  diffusion imaging could be performed. These results were called by telephone at the time of interpretation on 09/06/2015 at 3:07 pm to Dr. Shaune Pollack , who verbally acknowledged these results. Electronically Signed   By: Natasha Mead M.D.   On: 09/06/2015 15:08   Mr Brain Wo Contrast  Result Date: 09/07/2015 CLINICAL DATA:  60 year old female with episodes of headache, dizziness, diaphoresis, palpitations. Possible right basal ganglia lesion on noncontrast head CT. Initial encounter. EXAM: MRI HEAD WITHOUT CONTRAST TECHNIQUE: Multiplanar, multiecho pulse sequences of the brain and surrounding structures were obtained without intravenous contrast. COMPARISON:  Noncontrast head CT 09/06/2015. FINDINGS: Cerebral volume is within normal limits for age. No restricted diffusion to suggest acute infarction. No midline shift, mass effect, evidence of mass lesion, ventriculomegaly, extra-axial collection or acute intracranial hemorrhage. Cervicomedullary junction and pituitary are within normal limits. Major intracranial vascular flow voids are preserved. The round 9 mm area in the inferior right basal ganglia is re- demonstrated and is isointense to CSF on all images. This is a small perivascular space (normal variant). Wallace Cullens and white matter signal is within normal limits throughout the brain. No chronic cerebral blood products. Visible internal auditory structures appear normal. Visualized paranasal sinuses and mastoids are stable and well pneumatized. Negative visualized cervical spine. Visualized bone marrow signal is within normal limits. Negative  orbit and scalp soft tissues. IMPRESSION: Normal for age MRI appearance of the brain. Noncontrast head CT finding is a small perivascular space (normal variant). Electronically Signed   By: Odessa FlemingH  Hall M.D.   On: 09/07/2015 09:32      Management plans discussed with the patient and she is in agreement. Stable for discharge home  Patient should follow up with  pcp endocrine CODE STATUS:     Code Status Orders        Start     Ordered   09/06/15 1817  Full code  Continuous     09/06/15 1816    Code Status History    Date Active Date Inactive Code Status Order ID Comments User Context   09/06/2015  6:16 PM 09/07/2015 12:07 PM Full Code [id number]  Altamese DillingVaibhavkumar Vachhani, MD Inpatient      TOTAL TIME TAKING CARE OF THIS PATIENT: 36 minutes.    Note: This dictation was prepared with Dragon dictation along with smaller phrase technology. Any transcriptional errors that result from this process are unintentional.  Kennedi Lizardo M.D on 09/07/2015 at 12:21 PM  Between 7am to 6pm - Pager - [phone] After 6pm go to [email or web address] - Social research officer, governmentpassword [email or web address]  [facility]  Office  [phone]  CC: Primary care physician; No primary care provider on file.

## 2015-09-07 NOTE — Progress Notes (Signed)
Orders to DC pt to home with no HH needs. Pt sp[oke to MD about concerns and follow up appts. Pt and daughter informed to schedule follow up visits. Discharge instructions given to pt and daughter with verbal acknowledgment of understanding. IV and tele removed. Pt escorted off unit via wheelchair by nursing staff.

## 2015-09-07 NOTE — Progress Notes (Signed)
*  PRELIMINARY RESULTS* Echocardiogram 2D Echocardiogram has been performed.  Cristela BlueHege, Raina Sole 09/07/2015, 12:59 PM

## 2015-09-07 NOTE — Progress Notes (Signed)
   [facility] [address] [place], [place] [address]  September 07, 2015  Patient:  Kristin Haas Date of Birth: 1955-08-07 Date of Visit:  09/06/2015  To Whom it May Concern:  Please excuse Kristin Haas from work from 09/06/2015 until 09/07/15 as she was admitted to the [facility] for medical treatment and has been receiving appropriate care. She may return to work on 09/10/15, sooner if she feels she is able to return sooner than this date.      Please don't hesitate to contact me with questions or concerns by calling  [phone] and asking them to page me directly.   Marge Duncansave Kristin Dorian, MD

## 2015-09-16 LAB — METANEPHRINES, PLASMA
METANEPHRINE FREE: 30 pg/mL (ref 0–62)
Normetanephrine, Free: 81 pg/mL (ref 0–145)

## 2020-02-04 ENCOUNTER — Observation Stay (HOSPITAL_COMMUNITY)
Admission: EM | Admit: 2020-02-04 | Discharge: 2020-02-07 | Disposition: A | Discharge status: Home / Self Care | Payer: Medicare Other | Attending: Internal Medicine | Admitting: Internal Medicine

## 2020-02-04 ENCOUNTER — Emergency Department (HOSPITAL_COMMUNITY): Payer: Medicare Other

## 2020-02-04 ENCOUNTER — Encounter (HOSPITAL_COMMUNITY): Payer: Self-pay

## 2020-02-04 ENCOUNTER — Other Ambulatory Visit: Payer: Self-pay

## 2020-02-04 DIAGNOSIS — S301XXA Contusion of abdominal wall, initial encounter: Secondary | ICD-10-CM | POA: Diagnosis not present

## 2020-02-04 DIAGNOSIS — S3011XA Contusion of abdominal wall, initial encounter: Secondary | ICD-10-CM | POA: Diagnosis present

## 2020-02-04 DIAGNOSIS — Y9389 Activity, other specified: Secondary | ICD-10-CM | POA: Insufficient documentation

## 2020-02-04 DIAGNOSIS — S3991XA Unspecified injury of abdomen, initial encounter: Secondary | ICD-10-CM | POA: Diagnosis present

## 2020-02-04 DIAGNOSIS — R791 Abnormal coagulation profile: Secondary | ICD-10-CM | POA: Diagnosis present

## 2020-02-04 DIAGNOSIS — E785 Hyperlipidemia, unspecified: Secondary | ICD-10-CM | POA: Diagnosis present

## 2020-02-04 DIAGNOSIS — Y29XXXA Contact with blunt object, undetermined intent, initial encounter: Secondary | ICD-10-CM | POA: Insufficient documentation

## 2020-02-04 DIAGNOSIS — I10 Essential (primary) hypertension: Secondary | ICD-10-CM | POA: Diagnosis present

## 2020-02-04 DIAGNOSIS — Z20822 Contact with and (suspected) exposure to covid-19: Secondary | ICD-10-CM | POA: Diagnosis not present

## 2020-02-04 DIAGNOSIS — Y9289 Other specified places as the place of occurrence of the external cause: Secondary | ICD-10-CM | POA: Insufficient documentation

## 2020-02-04 DIAGNOSIS — I4891 Unspecified atrial fibrillation: Secondary | ICD-10-CM | POA: Diagnosis present

## 2020-02-04 HISTORY — DX: Contusion of abdominal wall, initial encounter: S30.1XXA

## 2020-02-04 LAB — HEMOGLOBIN AND HEMATOCRIT, BLOOD
HCT: 39.4 % (ref 36.0–46.0)
Hemoglobin: 12.8 g/dL (ref 12.0–15.0)

## 2020-02-04 LAB — CBC WITH DIFFERENTIAL/PLATELET
Abs Immature Granulocytes: 0.04 10*3/uL (ref 0.00–0.07)
Basophils Absolute: 0.1 10*3/uL (ref 0.0–0.1)
Basophils Relative: 1 %
Eosinophils Absolute: 0 10*3/uL (ref 0.0–0.5)
Eosinophils Relative: 0 %
HCT: 34.2 % — ABNORMAL LOW (ref 36.0–46.0)
Hemoglobin: 10.4 g/dL — ABNORMAL LOW (ref 12.0–15.0)
Immature Granulocytes: 0 %
Lymphocytes Relative: 10 %
Lymphs Abs: 1.1 10*3/uL (ref 0.7–4.0)
MCH: 26.3 pg (ref 26.0–34.0)
MCHC: 30.4 g/dL (ref 30.0–36.0)
MCV: 86.4 fL (ref 80.0–100.0)
Monocytes Absolute: 0.4 10*3/uL (ref 0.1–1.0)
Monocytes Relative: 3 %
Neutro Abs: 8.9 10*3/uL — ABNORMAL HIGH (ref 1.7–7.7)
Neutrophils Relative %: 86 %
Platelets: 191 10*3/uL (ref 150–400)
RBC: 3.96 MIL/uL (ref 3.87–5.11)
RDW: 14.7 % (ref 11.5–15.5)
WBC: 10.5 10*3/uL (ref 4.0–10.5)
nRBC: 0 % (ref 0.0–0.2)

## 2020-02-04 LAB — COMPREHENSIVE METABOLIC PANEL WITH GFR
ALT: 20 U/L (ref 0–44)
AST: 17 U/L (ref 15–41)
Albumin: 3.5 g/dL (ref 3.5–5.0)
Alkaline Phosphatase: 87 U/L (ref 38–126)
Anion gap: 9 (ref 5–15)
BUN: 16 mg/dL (ref 8–23)
CO2: 24 mmol/L (ref 22–32)
Calcium: 8.9 mg/dL (ref 8.9–10.3)
Chloride: 107 mmol/L (ref 98–111)
Creatinine, Ser: 0.75 mg/dL (ref 0.44–1.00)
GFR, Estimated: >60 mL/min
Glucose, Bld: 118 mg/dL — ABNORMAL HIGH (ref 70–99)
Potassium: 3.9 mmol/L (ref 3.5–5.1)
Sodium: 140 mmol/L (ref 135–145)
Total Bilirubin: 0.5 mg/dL (ref 0.3–1.2)
Total Protein: 6.1 g/dL — ABNORMAL LOW (ref 6.5–8.1)

## 2020-02-04 LAB — TYPE AND SCREEN
ABO/RH(D): A POS
Antibody Screen: NEGATIVE

## 2020-02-04 LAB — PROTIME-INR
INR: 3.8 — ABNORMAL HIGH (ref 0.8–1.2)
INR: 1.4 — ABNORMAL HIGH (ref 0.8–1.2)
Prothrombin Time: 36 seconds — ABNORMAL HIGH (ref 11.4–15.2)
Prothrombin Time: 16.4 seconds — ABNORMAL HIGH (ref 11.4–15.2)

## 2020-02-04 LAB — HIV ANTIBODY (ROUTINE TESTING W REFLEX): HIV Screen 4th Generation wRfx: NONREACTIVE

## 2020-02-04 LAB — ABO/RH: ABO/RH(D): A POS

## 2020-02-04 MED ORDER — METOPROLOL SUCCINATE ER 25 MG PO TB24
25.0000 mg | ORAL_TABLET | Freq: Every day | ORAL | Status: DC
  Administered 2020-02-04 – 2020-02-05 (×2): 25 mg via ORAL
  Filled 2020-02-04 (×2): qty 1

## 2020-02-04 MED ORDER — FENTANYL CITRATE (PF) 100 MCG/2ML IJ SOLN
50.0000 ug | Freq: Once | INTRAMUSCULAR | Status: AC | PRN
  Administered 2020-02-04: 50 ug via INTRAVENOUS
  Filled 2020-02-04: qty 2

## 2020-02-04 MED ORDER — MELATONIN 10 MG PO TABS: 1.0000 | ORAL_TABLET | Freq: Every day | ORAL | Status: DC

## 2020-02-04 MED ORDER — IOHEXOL 300 MG/ML  SOLN
100.0000 mL | Freq: Once | INTRAMUSCULAR | Status: AC | PRN
  Administered 2020-02-04: 100 mL via INTRAVENOUS

## 2020-02-04 MED ORDER — PROTHROMBIN COMPLEX CONC HUMAN 500 UNITS IV KIT: 1500.0000 [IU] | PACK | Status: DC

## 2020-02-04 MED ORDER — ROSUVASTATIN CALCIUM 20 MG PO TABS
40.0000 mg | ORAL_TABLET | Freq: Every day | ORAL | Status: DC
  Administered 2020-02-04 – 2020-02-07 (×4): 40 mg via ORAL
  Filled 2020-02-04 (×4): qty 2

## 2020-02-04 MED ORDER — PHYTONADIONE 5 MG PO TABS
10.0000 mg | ORAL_TABLET | Freq: Once | ORAL | Status: DC
  Filled 2020-02-04: qty 2

## 2020-02-04 MED ORDER — MORPHINE SULFATE (PF) 4 MG/ML IV SOLN
4.0000 mg | INTRAVENOUS | Status: DC | PRN
  Administered 2020-02-04 – 2020-02-05 (×2): 4 mg via INTRAVENOUS
  Filled 2020-02-04 (×2): qty 1

## 2020-02-04 MED ORDER — OXYCODONE-ACETAMINOPHEN 5-325 MG PO TABS
1.0000 | ORAL_TABLET | Freq: Once | ORAL | Status: AC
  Administered 2020-02-04: 1 via ORAL
  Filled 2020-02-04: qty 1

## 2020-02-04 MED ORDER — ONDANSETRON HCL 4 MG/2ML IJ SOLN: 4.0000 mg | Freq: Four times a day (QID) | INTRAMUSCULAR | Status: DC | PRN

## 2020-02-04 MED ORDER — VITAMIN K1 10 MG/ML IJ SOLN
10.0000 mg | Freq: Once | INTRAVENOUS | Status: AC
  Administered 2020-02-04: 10 mg via INTRAVENOUS
  Filled 2020-02-04: qty 1

## 2020-02-04 MED ORDER — NICOTINE 14 MG/24HR TD PT24
14.0000 mg | MEDICATED_PATCH | Freq: Every day | TRANSDERMAL | Status: DC
  Administered 2020-02-04 – 2020-02-07 (×4): 14 mg via TRANSDERMAL
  Filled 2020-02-04 (×4): qty 1

## 2020-02-04 MED ORDER — LOSARTAN POTASSIUM 50 MG PO TABS
50.0000 mg | ORAL_TABLET | Freq: Every day | ORAL | Status: DC
  Administered 2020-02-04 – 2020-02-05 (×2): 50 mg via ORAL
  Filled 2020-02-04 (×2): qty 1

## 2020-02-04 MED ORDER — OXYCODONE HCL 5 MG PO TABS
5.0000 mg | ORAL_TABLET | ORAL | Status: DC | PRN
Start: 2020-02-04 — End: 2020-02-07
  Administered 2020-02-05 – 2020-02-07 (×5): 5 mg via ORAL
  Filled 2020-02-04 (×5): qty 1

## 2020-02-04 MED ORDER — PROTHROMBIN COMPLEX CONC HUMAN 500 UNITS IV KIT
1564.0000 [IU] | PACK | Status: AC
  Administered 2020-02-04: 1564 [IU] via INTRAVENOUS
  Filled 2020-02-04: qty 500

## 2020-02-04 MED ORDER — ACETAMINOPHEN 650 MG RE SUPP: 650.0000 mg | Freq: Four times a day (QID) | RECTAL | Status: DC | PRN

## 2020-02-04 MED ORDER — POLYETHYLENE GLYCOL 3350 17 G PO PACK: 17.0000 g | PACK | Freq: Every day | ORAL | Status: DC | PRN

## 2020-02-04 MED ORDER — AMIODARONE HCL 200 MG PO TABS
200.0000 mg | ORAL_TABLET | Freq: Every day | ORAL | Status: DC
  Administered 2020-02-04 – 2020-02-07 (×4): 200 mg via ORAL
  Filled 2020-02-04 (×4): qty 1

## 2020-02-04 MED ORDER — ONDANSETRON HCL 4 MG PO TABS: 4.0000 mg | ORAL_TABLET | Freq: Four times a day (QID) | ORAL | Status: DC | PRN

## 2020-02-04 MED ORDER — MELATONIN 5 MG PO TABS
10.0000 mg | ORAL_TABLET | Freq: Every day | ORAL | Status: DC
  Administered 2020-02-04 – 2020-02-06 (×3): 10 mg via ORAL
  Filled 2020-02-04 (×3): qty 2

## 2020-02-04 MED ORDER — FENTANYL CITRATE (PF) 100 MCG/2ML IJ SOLN
50.0000 ug | Freq: Once | INTRAMUSCULAR | Status: AC
Start: 2020-02-04 — End: 2020-02-04
  Administered 2020-02-04: 50 ug via INTRAVENOUS
  Filled 2020-02-04: qty 2

## 2020-02-04 MED ORDER — ACETAMINOPHEN 325 MG PO TABS
650.0000 mg | ORAL_TABLET | Freq: Four times a day (QID) | ORAL | Status: DC | PRN
  Administered 2020-02-06 – 2020-02-07 (×2): 650 mg via ORAL
  Filled 2020-02-04 (×2): qty 2

## 2020-02-04 NOTE — H&P (Addendum)
TRH H&P    Patient Demographics:    Kristin Haas, is a 65 y.o. female  MRN: [id number]  DOB - 1955-03-08  Admit Date - 02/04/2020  Referring MD/NP/PA: Dr. Criss AlvineGoldston  Outpatient Primary MD for the patient is Pcp, No  Patient coming from: Home   Chief complaint- Abdominal pain   HPI:    Kristin Haas  is a 65 y.o. female, with history of afib, HTN, and HLD presents to the ED with a chief complaint of abdominal pain. Patient reports that she was bending over to sit in a chair when she hit her right upper quadrant (directly under right breast) on the edge of the table - 5 days ago. 4 days ago she started feeling dull pain in that area. 3 days ago her pain was getting gradually worse, and more noticeable with position changes. 2 days ago she started to notice bruising. Then 1 day ago, she noticed swelling on the right side of the abdomen. She reports that the pain now feels like a pressure, but it is sharp when she has positional changes. Patient reports that it is different than when she has had broken ribs in the past. When she had a broken rib, she had pain with deep breath. For this event, she has pain when bearing down, but taking a deep breath does not bother it. Patient reports that she came in to the ED because she started to have orthostatic symptoms. She reports that three separate times today, when she stood up, she felt dizzy, flushed, nauseous, had palpitations, and felt near syncope. She has also had paresthesias in her finertips.   Patient is on coumadin for afib. Her last INR check was last week and was 4.4. She reports that her PCP called her two days ago and advised her to reduce her dose of warfarin. She has been taking it per her PCP instructions.   Patient has had considerable stress recently. Her son died in December with Stage IV lung cancer.   Patient smokes 1/2 ppd and is trying to quit. She would  like nicotine patch. She does not drink EtOH or use illicit drugs. She is vaxed for covid. She is Full code.   In the ED T 98.2, HR 50-63, R 17-22, BP 133/67, 9-100% Hgb 10.4 Chem is unremarkable A pos blood type CT abdomen shows 4x7x12cm rt rectus sheath hematoma with intralesional bleeding. Other incidental findings on CT as well - see below XR ribs - Negative Fentatnyl. Oxy, Vit K, and K centra given in ED Trauma and IR consulted from ED    Review of systems:    In addition to the HPI above,  No Fever-chills, No Headache, No changes with Vision or hearing, Admits to dizziness No problems swallowing food or Liquids, No Chest pain, Cough or Shortness of Breath, Admits to abdominal pain, No Nausea or Vomiting, bowel movements are regular, No Blood in stool or Urine, No dysuria, No new skin rashes or bruises, No new joints pains-aches,  No new weakness, numbness in any extremity, No  recent weight gain or loss, No polyuria, polydypsia or polyphagia,   All other systems reviewed and are negative.    Past History of the following :    History reviewed. No pertinent past medical history.    Past Surgical History:  Procedure Laterality Date  . HERNIA REPAIR    . TONSILLECTOMY    . TUBAL LIGATION        Social History:      Social History   Tobacco Use  . Smoking status: Heavy Tobacco Smoker    Packs/day: 1.00    Types: Cigarettes  . Smokeless tobacco: Never Used  Substance Use Topics  . Alcohol use: Yes       Family History :     Family History  Problem Relation Age of Onset  . Hypertension Father   . Hypertension Sister       Home Medications:   Prior to Admission medications   Medication Sig Start Date End Date Taking? Authorizing Provider  acetaminophen (TYLENOL) 500 MG tablet Take 500 mg by mouth every 6 (six) hours as needed for mild pain.   Yes [provider]  amiodarone (PACERONE) 200 MG tablet Take 200 mg by mouth daily.   Yes  [provider]  losartan (COZAAR) 50 MG tablet Take 50 mg by mouth daily.   Yes [provider]  Melatonin 10 MG TABS Take 1 tablet by mouth at bedtime.   Yes [provider]  metoprolol succinate (TOPROL-XL) 25 MG 24 hr tablet Take 25 mg by mouth daily.   Yes [provider]  rosuvastatin (CRESTOR) 40 MG tablet Take 40 mg by mouth daily.   Yes [provider]  warfarin (COUMADIN) 2 MG tablet Take 2 mg by mouth daily.   Yes [provider]     Allergies:     Allergies  Allergen Reactions  . Codeine Nausea And Vomiting     Physical Exam:   Vitals  Blood pressure 133/67, pulse (!) 56, temperature 98.2 F (36.8 C), temperature source Oral, resp. rate 17, SpO2 98 %.  1.  General: Patient laying supine in bed, holding abdomen  2. Psychiatric: AO x 3, pleasant and cooperative with exam  3. Neurologic: CN II-XII grossly intact, moves all 4 extremities voluntarily, speech and language normal  4. HEENMT:  Head is atraumatic normocephalic, pupils reactive, neck is supple, trachea is midline, mucous membranes are moist  5. Respiratory : LCTABL, no wheezes, rhonchi, or crackles. No cyanosis  6. Cardiovascular : Heart rate is normal, rhythm is regular, no murmurs, rubs or gallops  7. Gastrointestinal:  Abdomen is soft, hematoma palpable on right upper quadrant, bruising visible midline/epigastric region, bowel sounds active  8. Skin:  Skin is warm dry and intact, no acute lesions on limited skin exam  9.Musculoskeletal:  No peripheral edema, acute deformity, or calf tenderness    Data Review:    CBC Recent Labs  Lab 02/04/20 1525 02/04/20 2014  WBC 10.5  --   HGB 10.4* 12.8  HCT 34.2* 39.4  PLT 191  --   MCV 86.4  --   MCH 26.3  --   MCHC 30.4  --   RDW 14.7  --   LYMPHSABS 1.1  --   MONOABS 0.4  --   EOSABS 0.0  --   BASOSABS 0.1  --     ------------------------------------------------------------------------------------------------------------------  Results for orders placed or performed during the hospital encounter of 02/04/20 (from the past 48 hour(s))  Comprehensive  metabolic panel     Status: Abnormal   Collection Time: 02/04/20  3:25 PM  Result Value Ref Range   Sodium 140 135 - 145 mmol/L   Potassium 3.9 3.5 - 5.1 mmol/L   Chloride 107 98 - 111 mmol/L   CO2 24 22 - 32 mmol/L   Glucose, Bld 118 (H) 70 - 99 mg/dL    Comment: Glucose reference range applies only to samples taken after fasting for at least 8 hours.   BUN 16 8 - 23 mg/dL   Creatinine, Ser 9.67 0.44 - 1.00 mg/dL   Calcium 8.9 8.9 - 89.3 mg/dL   Total Protein 6.1 (L) 6.5 - 8.1 g/dL   Albumin 3.5 3.5 - 5.0 g/dL   AST 17 15 - 41 U/L   ALT 20 0 - 44 U/L   Alkaline Phosphatase 87 38 - 126 U/L   Total Bilirubin 0.5 0.3 - 1.2 mg/dL   GFR, Estimated >81 >01 mL/min    Comment: (NOTE) Calculated using the CKD-EPI Creatinine Equation (2021)    Anion gap 9 5 - 15    Comment: Performed at [facility], [address] [place], [place] [address]  CBC with Differential     Status: Abnormal   Collection Time: 02/04/20  3:25 PM  Result Value Ref Range   WBC 10.5 4.0 - 10.5 K/uL   RBC 3.96 3.87 - 5.11 MIL/uL   Hemoglobin 10.4 (L) 12.0 - 15.0 g/dL   HCT 58.5 (L) 27.7 - 82.4 %   MCV 86.4 80.0 - 100.0 fL   MCH 26.3 26.0 - 34.0 pg   MCHC 30.4 30.0 - 36.0 g/dL   RDW 23.5 36.1 - 44.3 %   Platelets 191 150 - 400 K/uL   nRBC 0.0 0.0 - 0.2 %   Neutrophils Relative % 86 %   Neutro Abs 8.9 (H) 1.7 - 7.7 K/uL   Lymphocytes Relative 10 %   Lymphs Abs 1.1 0.7 - 4.0 K/uL   Monocytes Relative 3 %   Monocytes Absolute 0.4 0.1 - 1.0 K/uL   Eosinophils Relative 0 %   Eosinophils Absolute 0.0 0.0 - 0.5 K/uL   Basophils Relative 1 %   Basophils Absolute 0.1 0.0 - 0.1 K/uL   Immature Granulocytes 0 %   Abs Immature Granulocytes 0.04 0.00 - 0.07 K/uL     Comment: Performed at [facility], [address], [place], [place] [address]  Protime-INR     Status: Abnormal   Collection Time: 02/04/20  3:25 PM  Result Value Ref Range   Prothrombin Time 36.0 (H) 11.4 - 15.2 seconds   INR 3.8 (H) 0.8 - 1.2    Comment: (NOTE) INR goal varies based on device and disease states. Performed at [facility], [address], [place], [place] [address]   ABO/Rh     Status: None   Collection Time: 02/04/20  3:25 PM  Result Value Ref Range   ABO/RH(D)      A POS Performed at [facility], [address] [place], [place] [address]   Type and screen [facility]     Status: None (Preliminary result)   Collection Time: 02/04/20  8:10 PM  Result Value Ref Range   ABO/RH(D) PENDING    Antibody Screen PENDING    Sample Expiration      02/07/2020,2359 Performed at [facility], [address] [place], [place] [address]   Hemoglobin and hematocrit,  blood     Status: None   Collection Time: 02/04/20  8:14 PM  Result Value Ref Range   Hemoglobin 12.8 12.0 - 15.0 g/dL   HCT 24.0 97.3 - 53.2 %    Comment: Performed at [facility], [address], [place], [place] [address]    Chemistries  Recent Labs  Lab 02/04/20 1525  NA 140  K 3.9  CL 107  CO2 24  GLUCOSE 118*  BUN 16  CREATININE 0.75  CALCIUM 8.9  AST 17  ALT 20  ALKPHOS 87  BILITOT 0.5   ------------------------------------------------------------------------------------------------------------------  ------------------------------------------------------------------------------------------------------------------ GFR: CrCl cannot be calculated (Unknown ideal weight.). Liver Function Tests: Recent Labs  Lab 02/04/20 1525  AST 17  ALT 20  ALKPHOS 87  BILITOT 0.5  PROT 6.1*  ALBUMIN 3.5   No results for input(s): LIPASE, AMYLASE in the last 168 hours. No results for input(s): AMMONIA in the last 168 hours. Coagulation  Profile: Recent Labs  Lab 02/04/20 1525  INR 3.8*   Cardiac Enzymes: No results for input(s): CKTOTAL, CKMB, CKMBINDEX, TROPONINI in the last 168 hours. BNP (last 3 results) No results for input(s): PROBNP in the last 8760 hours. HbA1C: No results for input(s): HGBA1C in the last 72 hours. CBG: No results for input(s): GLUCAP in the last 168 hours. Lipid Profile: No results for input(s): CHOL, HDL, LDLCALC, TRIG, CHOLHDL, LDLDIRECT in the last 72 hours. Thyroid Function Tests: No results for input(s): TSH, T4TOTAL, FREET4, T3FREE, THYROIDAB in the last 72 hours. Anemia Panel: No results for input(s): VITAMINB12, FOLATE, FERRITIN, TIBC, IRON, RETICCTPCT in the last 72 hours.  --------------------------------------------------------------------------------------------------------------- Urine analysis:    Component Value Date/Time   COLORURINE STRAW (A) 09/06/2015 1327   APPEARANCEUR CLEAR (A) 09/06/2015 1327   LABSPEC 1.009 09/06/2015 1327   PHURINE 6.0 09/06/2015 1327   GLUCOSEU NEGATIVE 09/06/2015 1327   HGBUR NEGATIVE 09/06/2015 1327   BILIRUBINUR NEGATIVE 09/06/2015 1327   KETONESUR NEGATIVE 09/06/2015 1327   PROTEINUR NEGATIVE 09/06/2015 1327   NITRITE NEGATIVE 09/06/2015 1327   LEUKOCYTESUR NEGATIVE 09/06/2015 1327      Imaging Results:    DG Ribs Unilateral W/Chest Right  Result Date: 02/04/2020 CLINICAL DATA:  Chest injury.  Right anterior rib pain. EXAM: RIGHT RIBS AND CHEST - 3+ VIEW COMPARISON:  None. FINDINGS: No fracture or other bone lesions are seen involving the ribs. There is no evidence of pneumothorax or pleural effusion. Both lungs are clear. Heart size and mediastinal contours are within normal limits. IMPRESSION: Negative. Electronically Signed   By: Elige Ko   On: 02/04/2020 13:35   CT ABDOMEN PELVIS W CONTRAST  Result Date: 02/04/2020 CLINICAL DATA:  Patient fell and hit side of ribs. Presenting with pain radiating downwwards. Bruise.  Abdominal trauma. EXAM: CT ABDOMEN AND PELVIS WITH CONTRAST TECHNIQUE: Multidetector CT imaging of the abdomen and pelvis was performed using the standard protocol following bolus administration of intravenous contrast. CONTRAST:  OMNIPAQUE IOHEXOL 300 MG/ML  SOLN COMPARISON:  None. FINDINGS: Lower chest: No acute abnormality. Hepatobiliary: A 1.5 cm fluid density lesion within the right hepatic lobe. Subcentimeter hypodensities are too small to characterize. No gallstones, gallbladder wall thickening, or pericholecystic fluid. No biliary dilatation. Pancreas: No focal lesion. Normal pancreatic contour. No surrounding inflammatory changes. No main pancreatic ductal dilatation. Spleen: Normal in size without focal abnormality. Adrenals/Urinary Tract: No adrenal nodule bilaterally. Bilateral kidneys enhance symmetrically. No hydronephrosis. No hydroureter. The urinary bladder is unremarkable. Stomach/Bowel: Stomach is within normal limits.  No evidence of bowel wall thickening or dilatation. Few scattered sigmoid diverticula. Stool throughout the colon. The appendix not definitely identified. Vascular/Lymphatic: No abdominal aorta or iliac aneurysm. Mild atherosclerotic plaque of the aorta and its branches. No abdominal, pelvic, or inguinal lymphadenopathy. Reproductive: There is a 1.6 cm hyperdensity within the uterus likely within the endometrial canal. Otherwise the uterus and bilateral adnexa are unremarkable. Other: No intraperitoneal free fluid. No intraperitoneal free gas. No organized fluid collection. Musculoskeletal: Approximately 4 x 7 x 12 cm right rectus sheath hematoma. Active extravasation within the hematoma noted on the delayed imaging (8:5). No suspicious lytic or blastic osseous lesions. No acute displaced fracture. Multilevel mild degenerative changes of the spine. IMPRESSION: 1. A 4 x 7 x 12 cm right rectus sheath hematoma with active intralesional bleeding. 2. A 1.6 cm hyperdensity within  the uterus likely within the endometrial canal. Recommend correlation with prior cross-sectional imaging and/or pelvic ultrasound. If not available, considering patient is postmenopausal, recommend pelvic ultrasound for further evaluation. 3. Scattered sigmoid diverticulosis with no acute diverticulitis. Electronically Signed   By: Tish Frederickson M.D.   On: 02/04/2020 18:37      Assessment & Plan:    Active Problems:   Rectus sheath hematoma, initial encounter   1. Rectus sheath hematoma 1. Injury 5 days ago 2. CT shows rectus sheath hematoma with active bleeding 3. Typed and screened in the ED 4. Hgb 10.4 (down from 14 in 2017) 5. HH q6H 6. Trauma reports that they do not think she would need a procedure with them, they advised consulting IR 7. IR recommends reversal - and likely no procedure as this area is difficult to identify bleed 8. Repeat CT in the AM 9. Continue to monitor 2. Supra-therapeutic INR 1. INR 4.5 last week, INR 3.8 2. Reversed with K centra and Vit K 3. Continue to monitor 3. Afib 1. Continue Beta blocker 2. Continue amiodarone 3. Holding warfarin at this time 4. Continue to monitor 4. HLD 1. Continue Statin 5. HTN 1. Continue amlodipine and losartan   DVT Prophylaxis-   Holding pharm DVT prophylaxis in the setting of active bleed - SCDs   AM Labs Ordered, also please review Full Orders  Family Communication: No family at bedside Code Status:  Full   Admission statusInpatient :The appropriate admission status for this patient is INPATIENT. Inpatient status is judged to be reasonable and necessary in order to provide the required intensity of service to ensure the patient's safety. The patient's presenting symptoms, physical exam findings, and initial radiographic and laboratory data in the context of their chronic comorbidities is felt to place them at high risk for further clinical deterioration. Furthermore, it is not anticipated that the patient will  be medically stable for discharge from the hospital within 2 midnights of admission. The following factors support the admission status of inpatient.     The patient's presenting symptoms include abdominal pain The worrisome physical exam findings include bruising in the epigastric region The initial radiographic and laboratory data are worrisome because of Rectus sheath hematoma with active bleeding on CT The chronic co-morbidities include Afib, HTN, HLD       * I certify that at the point of admission it is my clinical judgment that the patient will require inpatient hospital care spanning beyond 2 midnights from the point of admission due to high intensity of service, high risk for further deterioration and high frequency of surveillance required.*  Time spent in minutes : 65  [place]

## 2020-02-04 NOTE — ED Provider Notes (Signed)
[facility] EMERGENCY DEPARTMENT Provider Note   CSN: [id number] Arrival date & time: 02/04/20  1240     History Chief Complaint  Patient presents with  . Chest Injury  . Abdominal Pain    Kristin Haas is a 65 y.o. female.  HPI 65 year old female presents with abdominal pain.  5 days ago she hit her abdomen/right lower chest on a table.  She had pain that night and the next day but then since then the pain has been progressively worsening.  Feels like her right upper quadrant is swollen.  She has also noticed epigastric bruising that developed later.  She is worried about internal bleeding because she is on warfarin.  She is on this for A. fib and her last INR was 4.4 last week.  No dyspnea.  Pain is about a 9 out of 10.  Hurts significantly whenever she tries to move.  History reviewed. No pertinent past medical history.  Patient Active Problem List   Diagnosis Date Noted  . Rectus sheath hematoma, initial encounter 02/04/2020  . A-fib (HCC) 02/04/2020  . Supratherapeutic INR 02/04/2020  . HTN (hypertension) 02/04/2020  . HLD (hyperlipidemia) 02/04/2020  . Flushing 09/06/2015  . Palpitation 09/06/2015    Past Surgical History:  Procedure Laterality Date  . HERNIA REPAIR    . TONSILLECTOMY    . TUBAL LIGATION       OB History   No obstetric history on file.     Family History  Problem Relation Age of Onset  . Hypertension Father   . Hypertension Sister     Social History   Tobacco Use  . Smoking status: Heavy Tobacco Smoker    Packs/day: 1.00    Types: Cigarettes  . Smokeless tobacco: Never Used  Substance Use Topics  . Alcohol use: Yes  . Drug use: No    Home Medications Prior to Admission medications   Medication Sig Start Date End Date Taking? Authorizing Provider  acetaminophen (TYLENOL) 500 MG tablet Take 500 mg by mouth every 6 (six) hours as needed for mild pain.   Yes [provider]  amiodarone (PACERONE) 200 MG  tablet Take 200 mg by mouth daily.   Yes [provider]  losartan (COZAAR) 50 MG tablet Take 50 mg by mouth daily.   Yes [provider]  Melatonin 10 MG TABS Take 1 tablet by mouth at bedtime.   Yes [provider]  metoprolol succinate (TOPROL-XL) 25 MG 24 hr tablet Take 25 mg by mouth daily.   Yes [provider]  rosuvastatin (CRESTOR) 40 MG tablet Take 40 mg by mouth daily.   Yes [provider]  warfarin (COUMADIN) 2 MG tablet Take 2 mg by mouth daily.   Yes [provider]    Allergies    Codeine  Review of Systems   Review of Systems  Respiratory: Negative for shortness of breath.   Cardiovascular: Negative for chest pain.  Gastrointestinal: Positive for abdominal pain.  Musculoskeletal: Positive for back pain.  All other systems reviewed and are negative.   Physical Exam Updated Vital Signs BP 133/67   Pulse (!) 56   Temp 98.2 F (36.8 C) (Oral)   Resp 17   SpO2 98%   Physical Exam Vitals and nursing note reviewed.  Constitutional:      Appearance: She is well-developed and well-nourished.  HENT:     Head: Normocephalic and atraumatic.     Right Ear: External ear normal.  Left Ear: External ear normal.     Nose: Nose normal.  Eyes:     General:        Right eye: No discharge.        Left eye: No discharge.  Cardiovascular:     Rate and Rhythm: Normal rate and regular rhythm.     Heart sounds: Normal heart sounds.  Pulmonary:     Effort: Pulmonary effort is normal.     Breath sounds: Normal breath sounds.  Abdominal:     Palpations: Abdomen is soft.     Tenderness: There is abdominal tenderness in the right upper quadrant.       Comments: Ecchymosis to epigastrum Tenderness to RUQ  Skin:    General: Skin is warm and dry.  Neurological:     Mental Status: She is alert.  Psychiatric:        Mood and Affect: Mood is not anxious.     ED Results / Procedures / Treatments   Labs (all labs  ordered are listed, but only abnormal results are displayed) Labs Reviewed  COMPREHENSIVE METABOLIC PANEL - Abnormal; Notable for the following components:      Result Value   Glucose, Bld 118 (*)    Total Protein 6.1 (*)    All other components within normal limits  CBC WITH DIFFERENTIAL/PLATELET - Abnormal; Notable for the following components:   Hemoglobin 10.4 (*)    HCT 34.2 (*)    Neutro Abs 8.9 (*)    All other components within normal limits  PROTIME-INR - Abnormal; Notable for the following components:   Prothrombin Time 36.0 (*)    INR 3.8 (*)    All other components within normal limits  PROTIME-INR - Abnormal; Notable for the following components:   Prothrombin Time 16.4 (*)    INR 1.4 (*)    All other components within normal limits  SARS CORONAVIRUS 2 (TAT 6-24 HRS)  HEMOGLOBIN AND HEMATOCRIT, BLOOD  PROTIME-INR  HIV ANTIBODY (ROUTINE TESTING W REFLEX)  COMPREHENSIVE METABOLIC PANEL  MAGNESIUM  CBC  HEMOGLOBIN AND HEMATOCRIT, BLOOD  TYPE AND SCREEN  ABO/RH    EKG None  Radiology DG Ribs Unilateral W/Chest Right  Result Date: 02/04/2020 CLINICAL DATA:  Chest injury.  Right anterior rib pain. EXAM: RIGHT RIBS AND CHEST - 3+ VIEW COMPARISON:  None. FINDINGS: No fracture or other bone lesions are seen involving the ribs. There is no evidence of pneumothorax or pleural effusion. Both lungs are clear. Heart size and mediastinal contours are within normal limits. IMPRESSION: Negative. Electronically Signed   By: Elige Ko   On: 02/04/2020 13:35   CT ABDOMEN PELVIS W CONTRAST  Result Date: 02/04/2020 CLINICAL DATA:  Patient fell and hit side of ribs. Presenting with pain radiating downwwards. Bruise. Abdominal trauma. EXAM: CT ABDOMEN AND PELVIS WITH CONTRAST TECHNIQUE: Multidetector CT imaging of the abdomen and pelvis was performed using the standard protocol following bolus administration of intravenous contrast. CONTRAST:  OMNIPAQUE IOHEXOL 300 MG/ML  SOLN  COMPARISON:  None. FINDINGS: Lower chest: No acute abnormality. Hepatobiliary: A 1.5 cm fluid density lesion within the right hepatic lobe. Subcentimeter hypodensities are too small to characterize. No gallstones, gallbladder wall thickening, or pericholecystic fluid. No biliary dilatation. Pancreas: No focal lesion. Normal pancreatic contour. No surrounding inflammatory changes. No main pancreatic ductal dilatation. Spleen: Normal in size without focal abnormality. Adrenals/Urinary Tract: No adrenal nodule bilaterally. Bilateral kidneys enhance symmetrically. No hydronephrosis. No hydroureter. The urinary bladder is unremarkable. Stomach/Bowel: Stomach  is within normal limits. No evidence of bowel wall thickening or dilatation. Few scattered sigmoid diverticula. Stool throughout the colon. The appendix not definitely identified. Vascular/Lymphatic: No abdominal aorta or iliac aneurysm. Mild atherosclerotic plaque of the aorta and its branches. No abdominal, pelvic, or inguinal lymphadenopathy. Reproductive: There is a 1.6 cm hyperdensity within the uterus likely within the endometrial canal. Otherwise the uterus and bilateral adnexa are unremarkable. Other: No intraperitoneal free fluid. No intraperitoneal free gas. No organized fluid collection. Musculoskeletal: Approximately 4 x 7 x 12 cm right rectus sheath hematoma. Active extravasation within the hematoma noted on the delayed imaging (8:5). No suspicious lytic or blastic osseous lesions. No acute displaced fracture. Multilevel mild degenerative changes of the spine. IMPRESSION: 1. A 4 x 7 x 12 cm right rectus sheath hematoma with active intralesional bleeding. 2. A 1.6 cm hyperdensity within the uterus likely within the endometrial canal. Recommend correlation with prior cross-sectional imaging and/or pelvic ultrasound. If not available, considering patient is postmenopausal, recommend pelvic ultrasound for further evaluation. 3. Scattered sigmoid  diverticulosis with no acute diverticulitis. Electronically Signed   By: Tish FredericksonMorgane  Naveau M.D.   On: 02/04/2020 18:37    Procedures .Critical Care Performed by: Pricilla LovelessGoldston, Dona Walby, MD Authorized by: Pricilla LovelessGoldston, Paradise Vensel, MD   Critical care provider statement:    Critical care time (minutes):  35   Critical care time was exclusive of:  Separately billable procedures and treating other patients   Critical care was necessary to treat or prevent imminent or life-threatening deterioration of the following conditions:  Circulatory failure   Critical care was time spent personally by me on the following activities:  Discussions with consultants, evaluation of patient's response to treatment, examination of patient, ordering and performing treatments and interventions, ordering and review of laboratory studies, ordering and review of radiographic studies, pulse oximetry, re-evaluation of patient's condition, obtaining history from patient or surrogate and review of old charts   (including critical care time)  Medications Ordered in ED Medications  amiodarone (PACERONE) tablet 200 mg (has no administration in time range)  losartan (COZAAR) tablet 50 mg (has no administration in time range)  metoprolol succinate (TOPROL-XL) 24 hr tablet 25 mg (has no administration in time range)  rosuvastatin (CRESTOR) tablet 40 mg (has no administration in time range)  acetaminophen (TYLENOL) tablet 650 mg (has no administration in time range)    Or  acetaminophen (TYLENOL) suppository 650 mg (has no administration in time range)  oxyCODONE (Oxy IR/ROXICODONE) immediate release tablet 5 mg (has no administration in time range)  morphine 4 MG/ML injection 4 mg (has no administration in time range)  polyethylene glycol (MIRALAX / GLYCOLAX) packet 17 g (has no administration in time range)  ondansetron (ZOFRAN) tablet 4 mg (has no administration in time range)    Or  ondansetron (ZOFRAN) injection 4 mg (has no  administration in time range)  melatonin tablet 10 mg (has no administration in time range)  nicotine (NICODERM CQ - dosed in mg/24 hours) patch 14 mg (has no administration in time range)  oxyCODONE-acetaminophen (PERCOCET/ROXICET) 5-325 MG per tablet 1 tablet (1 tablet Oral Given 02/04/20 1313)  fentaNYL (SUBLIMAZE) injection 50 mcg (50 mcg Intravenous Given 02/04/20 1615)  fentaNYL (SUBLIMAZE) injection 50 mcg (50 mcg Intravenous Given 02/04/20 2000)  iohexol (OMNIPAQUE) 300 MG/ML solution 100 mL (100 mLs Intravenous Contrast Given 02/04/20 1757)  prothrombin complex conc human (KCENTRA) IVPB 1,564 Units (0 Units Intravenous Stopped 02/04/20 2039)  phytonadione (VITAMIN K) 10 mg in dextrose 5 %  50 mL IVPB (0 mg Intravenous Stopped 02/04/20 2302)    ED Course  I have reviewed the triage vital signs and the nursing notes.  Pertinent labs & imaging results that were available during my care of the patient were reviewed by me and considered in my medical decision making (see chart for details).    MDM Rules/Calculators/A&P                          Patient's CT was personally reviewed and shows active extravasation in the rectus sheath hematoma.  Discussed with trauma surgery, they recommend calling IR and they will consult but should be a medical admission.  Discussed with interventional radiology, Dr. Fredia Sorrow, who has reviewed the CT images and at this point does not think IR would be warranted.  However if she gets worse may need to reevaluate.  We will reverse her Coumadin.  Given vitamin K and Kcentra.  Admit to hospitalist service. Final Clinical Impression(s) / ED Diagnoses Final diagnoses:  Rectus sheath hematoma, initial encounter    Rx / DC Orders ED Discharge Orders    None       Pricilla Loveless, MD 02/04/20 2310

## 2020-02-04 NOTE — Consult Note (Addendum)
Reason for Consult/Chief Complaint: rectus sheath hematoma Consultant: Criss Alvine, MD  Kristin Haas is an 65 y.o. female.   HPI: 74F reports hitting her abdomen on the side of a table while attempting to sit down. Incident occurred Friday, 1/14. Had pain at the site of injury which progressively worsened and began to bruise on Monday, 1/17. Since then swelling and pain has continued to worsen. Associated nausea and dizziness. Denies chest pain or dyspnea. Takes coumadin for afib and has been taking as scheduled.   History reviewed. No pertinent past medical history.  Past Surgical History:  Procedure Laterality Date  . HERNIA REPAIR    . TONSILLECTOMY    . TUBAL LIGATION      Family History  Problem Relation Age of Onset  . Hypertension Father   . Hypertension Sister     Social History:  reports that she has been smoking cigarettes. She has been smoking about 1.00 pack per day. She has never used smokeless tobacco. She reports current alcohol use. She reports that she does not use drugs.  Allergies:  Allergies  Allergen Reactions  . Codeine Nausea And Vomiting    Medications: I have reviewed the patient's current medications.  Results for orders placed or performed during the hospital encounter of 02/04/20 (from the past 48 hour(s))  Comprehensive metabolic panel     Status: Abnormal   Collection Time: 02/04/20  3:25 PM  Result Value Ref Range   Sodium 140 135 - 145 mmol/L   Potassium 3.9 3.5 - 5.1 mmol/L   Chloride 107 98 - 111 mmol/L   CO2 24 22 - 32 mmol/L   Glucose, Bld 118 (H) 70 - 99 mg/dL    Comment: Glucose reference range applies only to samples taken after fasting for at least 8 hours.   BUN 16 8 - 23 mg/dL   Creatinine, Ser 1.51 0.44 - 1.00 mg/dL   Calcium 8.9 8.9 - 76.1 mg/dL   Total Protein 6.1 (L) 6.5 - 8.1 g/dL   Albumin 3.5 3.5 - 5.0 g/dL   AST 17 15 - 41 U/L   ALT 20 0 - 44 U/L   Alkaline Phosphatase 87 38 - 126 U/L   Total Bilirubin 0.5 0.3 -  1.2 mg/dL   GFR, Estimated >60 >73 mL/min    Comment: (NOTE) Calculated using the CKD-EPI Creatinine Equation (2021)    Anion gap 9 5 - 15    Comment: Performed at [facility], [address], [place], [place] [address]  CBC with Differential     Status: Abnormal   Collection Time: 02/04/20  3:25 PM  Result Value Ref Range   WBC 10.5 4.0 - 10.5 K/uL   RBC 3.96 3.87 - 5.11 MIL/uL   Hemoglobin 10.4 (L) 12.0 - 15.0 g/dL   HCT 69.4 (L) 85.4 - 62.7 %   MCV 86.4 80.0 - 100.0 fL   MCH 26.3 26.0 - 34.0 pg   MCHC 30.4 30.0 - 36.0 g/dL   RDW 03.5 00.9 - 38.1 %   Platelets 191 150 - 400 K/uL   nRBC 0.0 0.0 - 0.2 %   Neutrophils Relative % 86 %   Neutro Abs 8.9 (H) 1.7 - 7.7 K/uL   Lymphocytes Relative 10 %   Lymphs Abs 1.1 0.7 - 4.0 K/uL   Monocytes Relative 3 %   Monocytes Absolute 0.4 0.1 - 1.0 K/uL   Eosinophils Relative 0 %   Eosinophils Absolute 0.0 0.0 - 0.5  K/uL   Basophils Relative 1 %   Basophils Absolute 0.1 0.0 - 0.1 K/uL   Immature Granulocytes 0 %   Abs Immature Granulocytes 0.04 0.00 - 0.07 K/uL    Comment: Performed at [facility], [address], [place], [place] [address]  Protime-INR     Status: Abnormal   Collection Time: 02/04/20  3:25 PM  Result Value Ref Range   Prothrombin Time 36.0 (H) 11.4 - 15.2 seconds   INR 3.8 (H) 0.8 - 1.2    Comment: (NOTE) INR goal varies based on device and disease states. Performed at [facility], [address] [place], [place] [address]     DG Ribs Unilateral W/Chest Right  Result Date: 02/04/2020 CLINICAL DATA:  Chest injury.  Right anterior rib pain. EXAM: RIGHT RIBS AND CHEST - 3+ VIEW COMPARISON:  None. FINDINGS: No fracture or other bone lesions are seen involving the ribs. There is no evidence of pneumothorax or pleural effusion. Both lungs are clear. Heart size and mediastinal contours are within normal limits. IMPRESSION: Negative. Electronically Signed   By: Elige Ko   On: 02/04/2020 13:35    CT ABDOMEN PELVIS W CONTRAST  Result Date: 02/04/2020 CLINICAL DATA:  Patient fell and hit side of ribs. Presenting with pain radiating downwwards. Bruise. Abdominal trauma. EXAM: CT ABDOMEN AND PELVIS WITH CONTRAST TECHNIQUE: Multidetector CT imaging of the abdomen and pelvis was performed using the standard protocol following bolus administration of intravenous contrast. CONTRAST:  OMNIPAQUE IOHEXOL 300 MG/ML  SOLN COMPARISON:  None. FINDINGS: Lower chest: No acute abnormality. Hepatobiliary: A 1.5 cm fluid density lesion within the right hepatic lobe. Subcentimeter hypodensities are too small to characterize. No gallstones, gallbladder wall thickening, or pericholecystic fluid. No biliary dilatation. Pancreas: No focal lesion. Normal pancreatic contour. No surrounding inflammatory changes. No main pancreatic ductal dilatation. Spleen: Normal in size without focal abnormality. Adrenals/Urinary Tract: No adrenal nodule bilaterally. Bilateral kidneys enhance symmetrically. No hydronephrosis. No hydroureter. The urinary bladder is unremarkable. Stomach/Bowel: Stomach is within normal limits. No evidence of bowel wall thickening or dilatation. Few scattered sigmoid diverticula. Stool throughout the colon. The appendix not definitely identified. Vascular/Lymphatic: No abdominal aorta or iliac aneurysm. Mild atherosclerotic plaque of the aorta and its branches. No abdominal, pelvic, or inguinal lymphadenopathy. Reproductive: There is a 1.6 cm hyperdensity within the uterus likely within the endometrial canal. Otherwise the uterus and bilateral adnexa are unremarkable. Other: No intraperitoneal free fluid. No intraperitoneal free gas. No organized fluid collection. Musculoskeletal: Approximately 4 x 7 x 12 cm right rectus sheath hematoma. Active extravasation within the hematoma noted on the delayed imaging (8:5). No suspicious lytic or blastic osseous lesions. No acute displaced fracture. Multilevel mild  degenerative changes of the spine. IMPRESSION: 1. A 4 x 7 x 12 cm right rectus sheath hematoma with active intralesional bleeding. 2. A 1.6 cm hyperdensity within the uterus likely within the endometrial canal. Recommend correlation with prior cross-sectional imaging and/or pelvic ultrasound. If not available, considering patient is postmenopausal, recommend pelvic ultrasound for further evaluation. 3. Scattered sigmoid diverticulosis with no acute diverticulitis. Electronically Signed   By: Tish Frederickson M.D.   On: 02/04/2020 18:37    ROS 10 point review of systems is negative except as listed above in HPI.   Physical Exam Blood pressure 126/61, pulse (!) 50, temperature 98.2 F (36.8 C), temperature source Oral, resp. rate 18, SpO2 97 %. Constitutional: well-developed, well-nourished HEENT: pupils equal, round, reactive to light, 69mm b/l,  moist conjunctiva, external inspection of ears and nose normal, hearing intact Oropharynx: normal oropharyngeal mucosa, normal dentition Neck: no thyromegaly, trachea midline, no midline cervical tenderness to palpation Chest: breath sounds equal bilaterally, normal respiratory effort, no midline or lateral chest wall tenderness to palpation/deformity Abdomen: soft, bruising in the epigastrium, R sided TTP with mild bulge, no hepatosplenomegaly GU: normal female genitalia  Back: no wounds, no thoracic/lumbar spine tenderness to palpation, no thoracic/lumbar spine stepoffs Rectal: deferred Extremities: 2+ radial and pedal pulses bilaterally, motor and sensation intact to bilateral UE and LE, no peripheral edema MSK: unable to assess gait/station, no clubbing/cyanosis of fingers/toes, normal ROM of all four extremities Skin: warm, dry, no rashes Psych: normal memory, normal mood/affect    Assessment/Plan: 89F with large right rectus sheath hematoma, therapeutic on coumadin  Recommend reversal of coumadin urgently. Recommend IR consultation for possible  AE, although in most cases unable to access the bleeding vessel. Discussed this with the patient. Recommend holding AC for at least the next few days and accepting the risk of stroke in the setting of AF. This was also discussed with the patient. Would recommend abdominal binder for comfort (ordered) and expect with reversal of AC that bleeding will tamponade. Trend CBC daily and transfuse for hgb <7 or symptoms. No indication for repeat CT imaging. Okay for regular diet if no intervention planned by IR. Will continue to follow.    Diamantina Monks, MD General and Trauma Surgery [facility]

## 2020-02-04 NOTE — ED Triage Notes (Addendum)
Pt reports on Friday she went to sit at a table and hit her RUQ on the table hard. Pain has gotten severe, bruising noted to mid abd, pt takes warfarin for a fib /.

## 2020-02-05 DIAGNOSIS — S301XXA Contusion of abdominal wall, initial encounter: Secondary | ICD-10-CM | POA: Diagnosis not present

## 2020-02-05 LAB — COMPREHENSIVE METABOLIC PANEL
ALT: 19 U/L (ref 0–44)
AST: 19 U/L (ref 15–41)
Albumin: 3.4 g/dL — ABNORMAL LOW (ref 3.5–5.0)
Alkaline Phosphatase: 84 U/L (ref 38–126)
Anion gap: 9 (ref 5–15)
BUN: 14 mg/dL (ref 8–23)
CO2: 26 mmol/L (ref 22–32)
Calcium: 9 mg/dL (ref 8.9–10.3)
Chloride: 104 mmol/L (ref 98–111)
Creatinine, Ser: 0.73 mg/dL (ref 0.44–1.00)
GFR, Estimated: >60 mL/min (ref >60)
Glucose, Bld: 122 mg/dL — ABNORMAL HIGH (ref 70–99)
Potassium: 3.2 mmol/L — ABNORMAL LOW (ref 3.5–5.1)
Sodium: 139 mmol/L (ref 135–145)
Total Bilirubin: 1 mg/dL (ref 0.3–1.2)
Total Protein: 6.2 g/dL — ABNORMAL LOW (ref 6.5–8.1)

## 2020-02-05 LAB — PROTIME-INR
INR: 1.1 (ref 0.8–1.2)
Prothrombin Time: 13.5 seconds (ref 11.4–15.2)

## 2020-02-05 LAB — CBC
HCT: 30.8 % — ABNORMAL LOW (ref 36.0–46.0)
Hemoglobin: 10.1 g/dL — ABNORMAL LOW (ref 12.0–15.0)
MCH: 27.5 pg (ref 26.0–34.0)
MCHC: 32.8 g/dL (ref 30.0–36.0)
MCV: 83.9 fL (ref 80.0–100.0)
Platelets: 196 10*3/uL (ref 150–400)
RBC: 3.67 MIL/uL — ABNORMAL LOW (ref 3.87–5.11)
RDW: 15 % (ref 11.5–15.5)
WBC: 11.5 10*3/uL — ABNORMAL HIGH (ref 4.0–10.5)
nRBC: 0 % (ref 0.0–0.2)

## 2020-02-05 LAB — SARS CORONAVIRUS 2 (TAT 6-24 HRS): SARS Coronavirus 2: NEGATIVE

## 2020-02-05 LAB — HEMOGLOBIN AND HEMATOCRIT, BLOOD
HCT: 28.7 % — ABNORMAL LOW (ref 36.0–46.0)
Hemoglobin: 9.3 g/dL — ABNORMAL LOW (ref 12.0–15.0)

## 2020-02-05 LAB — MAGNESIUM: Magnesium: 2.1 mg/dL (ref 1.7–2.4)

## 2020-02-05 MED ORDER — POTASSIUM CHLORIDE CRYS ER 20 MEQ PO TBCR
40.0000 meq | EXTENDED_RELEASE_TABLET | Freq: Once | ORAL | Status: AC
  Administered 2020-02-05: 40 meq via ORAL
  Filled 2020-02-05: qty 2

## 2020-02-05 MED ORDER — SENNOSIDES-DOCUSATE SODIUM 8.6-50 MG PO TABS
1.0000 | ORAL_TABLET | Freq: Every day | ORAL | Status: DC
  Administered 2020-02-05 – 2020-02-06 (×2): 1 via ORAL
  Filled 2020-02-05 (×2): qty 1

## 2020-02-05 MED ORDER — MORPHINE SULFATE (PF) 2 MG/ML IV SOLN: 1.0000 mg | INTRAVENOUS | Status: DC | PRN

## 2020-02-05 NOTE — Progress Notes (Addendum)
Subjective: CC: Doing well. She reports on 1/14 she was sitting down at the table and hit her abdomen on the table. No other injuries. No head trauma or loc. Her pain is better this morning, having intermittent crampy pain that is well controlled with medications. She is tolerating crackers, water and diet without increased pain, n/v. She is mobilizing in the room. She denies ha, neck pain, midline back pain, cp, sob, or extremity pain. She notes she has a follow up with her cardiologist in [place] next Thursday. She believes this is Dr Wynelle Link  Objective: Vital signs in last 24 hours: Temp:  [98.2 F (36.8 C)-98.4 F (36.9 C)] 98.4 F (36.9 C) (01/20 0540) Pulse Rate:  [50-63] 63 (01/20 0540) Resp:  [12-22] 20 (01/20 0540) BP: (103-166)/(61-87) 119/61 (01/20 0540) SpO2:  [96 %-100 %] 98 % (01/20 0540) Weight:  [69.1 kg] 69.1 kg (01/20 0500)    Intake/Output from previous day: No intake/output data recorded. Intake/Output this shift: No intake/output data recorded.  PE: General: pleasant, WD, female who is laying in bed in NAD HEENT: head is normocephalic, atraumatic.  Sclera are noninjected.  PERRL.  Ears and nose without any masses or lesions.  Mouth is pink and moist Neck: No midline tenderness or step offs. Heart: regular rate. Palpable radial and pedal pulses bilaterally Lungs: CTA b/l,  Respiratory effort nonlabored with normal rate Abd: Soft, NT, ND, +BS. Bruising to the epigastrium that tracks slightly right. . Skin: Ecchymosis as noted above, otherwise warm and dry with no masses, lesions, or rashes Neuro: Cranial nerves 2-12 grossly intact, moves all extremities Psych: A&Ox4 with an appropriate affect. Msk:  RUE: No gross deformities of joints or skin. Able passive/active shoulder, elbow, wrist and hand range of motion without pain.  No tenderness over shoulder, upper arm, elbow, forearm, wrists or hand. Radial 2+.  LUE: No gross deformities of joints or skin.  Able passive/active shoulder, elbow, wrist and hand range of motion without pain.  No tenderness over shoulder, upper arm, elbow, forearm, wrists or hand. Radial 2+.  RLE: Able passive/active range of motion of hip, knee, ankle and all digits of the foot without pain.  No tenderness over hip, upper legs, knee, lower leg, ankle or feet.  No lower extremity edema.  No calf tenderness.   LLE:  Able passive/active range of motion of hip, knee, ankle and all digits of the foot without pain.  No tenderness over hip, upper legs, knee, lower leg, ankle or feet.  No lower extremity edema.  No calf tenderness.   Back: No midline cervical, thoracic or lumbar tenderness or step-offs.      Lab Results:  Recent Labs    02/04/20 1525 02/04/20 2014 02/05/20 0409  WBC 10.5  --  11.5*  HGB 10.4* 12.8 10.1*  HCT 34.2* 39.4 30.8*  PLT 191  --  196   BMET Recent Labs    02/04/20 1525 02/05/20 0409  NA 140 139  K 3.9 3.2*  CL 107 104  CO2 24 26  GLUCOSE 118* 122*  BUN 16 14  CREATININE 0.75 0.73  CALCIUM 8.9 9.0   PT/INR Recent Labs    02/04/20 2107 02/05/20 0409  LABPROT 16.4* 13.5  INR 1.4* 1.1   CMP     Component Value Date/Time   NA 139 02/05/2020 0409   K 3.2 (L) 02/05/2020 0409   CL 104 02/05/2020 0409   CO2 26 02/05/2020 0409   GLUCOSE 122 (  H) 02/05/2020 0409   BUN 14 02/05/2020 0409   CREATININE 0.73 02/05/2020 0409   CALCIUM 9.0 02/05/2020 0409   PROT 6.2 (L) 02/05/2020 0409   ALBUMIN 3.4 (L) 02/05/2020 0409   AST 19 02/05/2020 0409   ALT 19 02/05/2020 0409   ALKPHOS 84 02/05/2020 0409   BILITOT 1.0 02/05/2020 0409   GFRNONAA >60 02/05/2020 0409   GFRAA >60 09/07/2015 0426   Lipase  No results found for: LIPASE     Studies/Results: DG Ribs Unilateral W/Chest Right  Result Date: 02/04/2020 CLINICAL DATA:  Chest injury.  Right anterior rib pain. EXAM: RIGHT RIBS AND CHEST - 3+ VIEW COMPARISON:  None. FINDINGS: No fracture or other bone lesions are seen involving  the ribs. There is no evidence of pneumothorax or pleural effusion. Both lungs are clear. Heart size and mediastinal contours are within normal limits. IMPRESSION: Negative. Electronically Signed   By: Elige Ko   On: 02/04/2020 13:35   CT ABDOMEN PELVIS W CONTRAST  Result Date: 02/04/2020 CLINICAL DATA:  Patient fell and hit side of ribs. Presenting with pain radiating downwwards. Bruise. Abdominal trauma. EXAM: CT ABDOMEN AND PELVIS WITH CONTRAST TECHNIQUE: Multidetector CT imaging of the abdomen and pelvis was performed using the standard protocol following bolus administration of intravenous contrast. CONTRAST:  OMNIPAQUE IOHEXOL 300 MG/ML  SOLN COMPARISON:  None. FINDINGS: Lower chest: No acute abnormality. Hepatobiliary: A 1.5 cm fluid density lesion within the right hepatic lobe. Subcentimeter hypodensities are too small to characterize. No gallstones, gallbladder wall thickening, or pericholecystic fluid. No biliary dilatation. Pancreas: No focal lesion. Normal pancreatic contour. No surrounding inflammatory changes. No main pancreatic ductal dilatation. Spleen: Normal in size without focal abnormality. Adrenals/Urinary Tract: No adrenal nodule bilaterally. Bilateral kidneys enhance symmetrically. No hydronephrosis. No hydroureter. The urinary bladder is unremarkable. Stomach/Bowel: Stomach is within normal limits. No evidence of bowel wall thickening or dilatation. Few scattered sigmoid diverticula. Stool throughout the colon. The appendix not definitely identified. Vascular/Lymphatic: No abdominal aorta or iliac aneurysm. Mild atherosclerotic plaque of the aorta and its branches. No abdominal, pelvic, or inguinal lymphadenopathy. Reproductive: There is a 1.6 cm hyperdensity within the uterus likely within the endometrial canal. Otherwise the uterus and bilateral adnexa are unremarkable. Other: No intraperitoneal free fluid. No intraperitoneal free gas. No organized fluid collection.  Musculoskeletal: Approximately 4 x 7 x 12 cm right rectus sheath hematoma. Active extravasation within the hematoma noted on the delayed imaging (8:5). No suspicious lytic or blastic osseous lesions. No acute displaced fracture. Multilevel mild degenerative changes of the spine. IMPRESSION: 1. A 4 x 7 x 12 cm right rectus sheath hematoma with active intralesional bleeding. 2. A 1.6 cm hyperdensity within the uterus likely within the endometrial canal. Recommend correlation with prior cross-sectional imaging and/or pelvic ultrasound. If not available, considering patient is postmenopausal, recommend pelvic ultrasound for further evaluation. 3. Scattered sigmoid diverticulosis with no acute diverticulitis. Electronically Signed   By: Tish Frederickson M.D.   On: 02/04/2020 18:37    Anti-infectives: Anti-infectives (From admission, onward)   None       Assessment/Plan Hx HTN Hx HLD Hx A. Fib  47F with large right rectus sheath hematoma after hitting abdomen on table - Patient previously therapeutic on coumadin for A. Fib. This was reversed appropriately with K centra and Vit K and INR responded from 3.8 on admission to 1.1 this AM - Primary team consulted IR who reported likely no procedure as this area is difficult to identify bleed -  Hgb stable this AM with hgb 10.1 from 10.4. No tachycardia or hypotension this am - Patient is tolerating diet without increased pain, n/v. She is mobilizing in the room.  - Given patients INR has normalized, hgb is stable, VSS without tachycardia or hypotension, patient is mobilizing without difficulty, tolerating diet and is NT on exam - our team will sign off. Recommending holding anticoagulation the next few days before restarting. She states she does understand there is an increased risk of stroke during the time her anticoagulation is held. She has a follow up appointment already with her cardiologist next Thursday. Can use abdominal binder for comfort. Can have a  diet as tolerated. Thank you for the consult.       LOS: 1 day    Jacinto Halim , [facility] Surgery 02/05/2020, 7:48 AM Please see Amion for pager number during day hours 7:00am-4:30pm

## 2020-02-05 NOTE — ED Notes (Signed)
Attempted report x1. 

## 2020-02-05 NOTE — TOC Initial Note (Signed)
Transition of Care [facility]) - Initial/Assessment Note    Patient Details  Name: Kristin Haas MRN: [id number] Date of Birth: May 10, 1955  [facility] CM/SW Contact:    Kingsley Plan, RN Phone Number: 02/05/2020, 2:19 PM  Clinical Narrative:                  Confirmed face sheet information. Patient from home alone. PCP is DR Yves Dill .    Expected Discharge Plan: Home/Self Care     Patient Goals and CMS Choice Patient states their goals for this hospitalization and ongoing recovery are:: to return to home CMS Medicare.gov Compare Post Acute Care list provided to:: Patient    Expected Discharge Plan and Services Expected Discharge Plan: Home/Self Care     Post Acute Care Choice: NA Living arrangements for the past 2 months: Single Family Home                 DME Arranged: N/A         HH Arranged: NA          Prior Living Arrangements/Services Living arrangements for the past 2 months: Single Family Home Lives with:: Self Patient language and need for interpreter reviewed:: Yes Do you feel safe going back to the place where you live?: Yes      Need for Family Participation in Patient Care: Yes (Comment) Care giver support system in place?: Yes (comment)   Criminal Activity/Legal Involvement Pertinent to Current Situation/Hospitalization: No - Comment as needed  Activities of Daily Living      Permission Sought/Granted   Permission granted to share information with : No              Emotional Assessment Appearance:: Appears stated age Attitude/Demeanor/Rapport: Engaged Affect (typically observed): Accepting Orientation: : Oriented to Self,Oriented to Place,Oriented to  Time,Oriented to Situation Alcohol / Substance Use: Not Applicable Psych Involvement: No (comment)  Admission diagnosis:  Rectus sheath hematoma, initial encounter [S30.1XXA] Patient Active Problem List   Diagnosis Date Noted  . Rectus sheath hematoma, initial  encounter 02/04/2020  . A-fib (HCC) 02/04/2020  . Supratherapeutic INR 02/04/2020  . HTN (hypertension) 02/04/2020  . HLD (hyperlipidemia) 02/04/2020  . [place] 09/06/2015  . Palpitation 09/06/2015   PCP:  Oneita Hurt No Pharmacy:   [facility] [address], [place] - [address] [place] [address] Phone: [phone] Fax: [phone]  [facility] [address] [place] - [address] [place] [address] Phone: [phone] Fax: [phone]     Social Determinants of Health (SDOH) Interventions    Readmission Risk Interventions No flowsheet data found.

## 2020-02-05 NOTE — Progress Notes (Signed)
PROGRESS NOTE    Kristin Haas  XLK:[id number] DOB: [birth year] DOA: 02/04/2020 PCP: Margaretann Loveless, MD    Chief Complaint  Patient presents with  . Chest Injury  . Abdominal Pain    Brief Narrative:  Kristin Haas  is a 65 y.o. female, with history of afib on Coumadin, HTN, and HLD presents to the ED with a chief complaint of abdominal pain. Patient reports that she was bending over to sit in a chair when she hit her right upper quadrant (directly under right breast) on the edge of the table - 5 days ago. 4 days ago she started feeling dull pain in that area. 3 days ago her pain was getting gradually worse, and more noticeable with position changes. 2 days ago she started to notice bruising. Then 1 day ago, she noticed swelling on the right side of the abdomen.   Subjective:  Still needs prn analgesics but pain is improving,  Blood pressure low normal Twin sister at bedside   Assessment & Plan:   Principal Problem:   Rectus sheath hematoma, initial encounter Active Problems:   A-fib (HCC)   Supratherapeutic INR   HTN (hypertension)   HLD (hyperlipidemia)  Rectus sheath hematoma with supratherapeutic INR, acute blood loss anemia -Trauma and interventional radiology consulted, no intervention recommended currently -coumadin held, received vitk and Kcentra -INR today is 1.1 -Abdominal binder -Pain control -Monitor CBC  HTN: bp low normal, hold lisinopril, continue metoprolol with holding parameters  afib Continue metoprolol, hold comadin  Hypokalemia, replace k   DVT prophylaxis: SCDs Start: 02/04/20 2112   Code Status:full Family Communication: sister at bedside  Disposition:   Status is: Observation  Dispo: The patient is from: home               Anticipated d/c is to: home              Anticipated d/c date is: To be determined              Patient currently is not medically ready to discharge  Consultants:   Trauma   IR  Procedures:    none  Antimicrobials:   none     Objective: Vitals:   02/05/20 0349 02/05/20 0500 02/05/20 0540 02/05/20 1425  BP: (!) 146/62  119/61 (!) 97/48  Pulse: 63  63 (!) 57  Resp: 20  20 16   Temp: 98.4 F (36.9 C)  98.4 F (36.9 C) 98.4 F (36.9 C)  TempSrc: Oral  Oral Oral  SpO2: 100%  98% 98%  Weight:  69.1 kg      Intake/Output Summary (Last 24 hours) at 02/05/2020 1559 Last data filed at 02/05/2020 1000 Gross per 24 hour  Intake 120 ml  Output -  Net 120 ml   Filed Weights   02/05/20 0500  Weight: 69.1 kg    Examination:  General exam: calm, NAD Respiratory system: Clear to auscultation. Respiratory effort normal. Cardiovascular system: S1 & S2 heard, RRR. No JVD, no murmur, No pedal edema. Gastrointestinal system: right side Abdomen slightly distended, tender, epigastric ecchymosis,  Normal bowel sounds heard. Central nervous system: Alert and oriented. No focal neurological deficits. Extremities: Symmetric 5 x 5 power. Skin: No rashes, lesions or ulcers Psychiatry: Judgement and insight appear normal. Mood & affect appropriate.     Data Reviewed: I have personally reviewed following labs and imaging studies  CBC: Recent Labs  Lab 02/04/20 1525 02/04/20 2014 02/05/20 0409 02/05/20 0906  WBC 10.5  --  11.5*  --   NEUTROABS 8.9*  --   --   --   HGB 10.4* 12.8 10.1* 9.3*  HCT 34.2* 39.4 30.8* 28.7*  MCV 86.4  --  83.9  --   PLT 191  --  196  --     Basic Metabolic Panel: Recent Labs  Lab 02/04/20 1525 02/05/20 0409  NA 140 139  K 3.9 3.2*  CL 107 104  CO2 24 26  GLUCOSE 118* 122*  BUN 16 14  CREATININE 0.75 0.73  CALCIUM 8.9 9.0  MG  --  2.1    GFR: CrCl cannot be calculated (Unknown ideal weight.).  Liver Function Tests: Recent Labs  Lab 02/04/20 1525 02/05/20 0409  AST 17 19  ALT 20 19  ALKPHOS 87 84  BILITOT 0.5 1.0  PROT 6.1* 6.2*  ALBUMIN 3.5 3.4*    CBG: No results for input(s): GLUCAP in the last 168  hours.   Recent Results (from the past 240 hour(s))  SARS CORONAVIRUS 2 (TAT 6-24 HRS) Nasopharyngeal Nasopharyngeal Swab     Status: None   Collection Time: 02/04/20  7:32 PM   Specimen: Nasopharyngeal Swab  Result Value Ref Range Status   SARS Coronavirus 2 NEGATIVE NEGATIVE Final    Comment: (NOTE) SARS-CoV-2 target nucleic acids are NOT DETECTED.  The SARS-CoV-2 RNA is generally detectable in upper and lower respiratory specimens during the acute phase of infection. Negative results do not preclude SARS-CoV-2 infection, do not rule out co-infections with other pathogens, and should not be used as the sole basis for treatment or other patient management decisions. Negative results must be combined with clinical observations, patient history, and epidemiological information. The expected result is Negative.  Fact Sheet for Patients: HairSlick.no  Fact Sheet for Healthcare Providers: quierodirigir.com  This test is not yet approved or cleared by the [place] FDA and  has been authorized for detection and/or diagnosis of SARS-CoV-2 by FDA under an Emergency Use Authorization (EUA). This EUA will remain  in effect (meaning this test can be used) for the duration of the COVID-19 declaration under Se ction 564(b)(1) of the Act, 21 U.S.C. section 360bbb-3(b)(1), unless the authorization is terminated or revoked sooner.  Performed at [facility], [address], [place], [place] [address]          Radiology Studies: DG Ribs Unilateral W/Chest Right  Result Date: 02/04/2020 CLINICAL DATA:  Chest injury.  Right anterior rib pain. EXAM: RIGHT RIBS AND CHEST - 3+ VIEW COMPARISON:  None. FINDINGS: No fracture or other bone lesions are seen involving the ribs. There is no evidence of pneumothorax or pleural effusion. Both lungs are clear. Heart size and mediastinal contours are within normal limits. IMPRESSION:  Negative. Electronically Signed   By: Elige Ko   On: 02/04/2020 13:35   CT ABDOMEN PELVIS W CONTRAST  Addendum Date: 02/05/2020   ADDENDUM REPORT: 02/04/2020 18:43 ADDENDUM: These results were called by telephone at the time of interpretation on 02/04/2020 at 6:43 pm to provider Pricilla Loveless , who verbally acknowledged these results. Electronically Signed   By: Tish Frederickson M.D.   On: 02/04/2020 18:43   Result Date: 02/04/2020 CLINICAL DATA:  Patient fell and hit side of ribs. Presenting with pain radiating downwwards. Bruise. Abdominal trauma. EXAM: CT ABDOMEN AND PELVIS WITH CONTRAST TECHNIQUE: Multidetector CT imaging of the abdomen and pelvis was performed using the standard protocol following bolus administration of intravenous contrast. CONTRAST:  OMNIPAQUE IOHEXOL 300 MG/ML  SOLN COMPARISON:  None. FINDINGS: Lower chest: No acute abnormality. Hepatobiliary: A 1.5 cm fluid density lesion within the right hepatic lobe. Subcentimeter hypodensities are too small to characterize. No gallstones, gallbladder wall thickening, or pericholecystic fluid. No biliary dilatation. Pancreas: No focal lesion. Normal pancreatic contour. No surrounding inflammatory changes. No main pancreatic ductal dilatation. Spleen: Normal in size without focal abnormality. Adrenals/Urinary Tract: No adrenal nodule bilaterally. Bilateral kidneys enhance symmetrically. No hydronephrosis. No hydroureter. The urinary bladder is unremarkable. Stomach/Bowel: Stomach is within normal limits. No evidence of bowel wall thickening or dilatation. Few scattered sigmoid diverticula. Stool throughout the colon. The appendix not definitely identified. Vascular/Lymphatic: No abdominal aorta or iliac aneurysm. Mild atherosclerotic plaque of the aorta and its branches. No abdominal, pelvic, or inguinal lymphadenopathy. Reproductive: There is a 1.6 cm hyperdensity within the uterus likely within the endometrial canal. Otherwise the uterus  and bilateral adnexa are unremarkable. Other: No intraperitoneal free fluid. No intraperitoneal free gas. No organized fluid collection. Musculoskeletal: Approximately 4 x 7 x 12 cm right rectus sheath hematoma. Active extravasation within the hematoma noted on the delayed imaging (8:5). No suspicious lytic or blastic osseous lesions. No acute displaced fracture. Multilevel mild degenerative changes of the spine. IMPRESSION: 1. A 4 x 7 x 12 cm right rectus sheath hematoma with active intralesional bleeding. 2. A 1.6 cm hyperdensity within the uterus likely within the endometrial canal. Recommend correlation with prior cross-sectional imaging and/or pelvic ultrasound. If not available, considering patient is postmenopausal, recommend pelvic ultrasound for further evaluation. 3. Scattered sigmoid diverticulosis with no acute diverticulitis. Electronically Signed: By: Tish Frederickson M.D. On: 02/04/2020 18:37        Scheduled Meds: . amiodarone  200 mg Oral Daily  . melatonin  10 mg Oral QHS  . metoprolol succinate  25 mg Oral Daily  . nicotine  14 mg Transdermal Daily  . rosuvastatin  40 mg Oral Daily  . senna-docusate  1 tablet Oral QHS   Continuous Infusions:   LOS: 1 day   Time spent: Greater than 50% of this time was spent in counseling, explanation of diagnosis, planning of further management, and coordination of care.  I have personally reviewed and interpreted on  02/05/2020 daily labs, I reviewed all nursing notes, pharmacy notes, consultant notes,  vitals, pertinent old records  I have discussed plan of care as described above with RN , patient and family on 02/05/2020  Voice Recognition /Dragon dictation system was used to create this note, attempts have been made to correct errors. Please contact the author with questions and/or clarifications.   Albertine Grates, MD PhD FACP [facility]  Available via Epic secure chat 7am-7pm for nonurgent issues Please page for urgent  issues To page the attending provider between 7A-7P or the covering provider during after hours 7P-7A, please log into the web site [email or web address] and access using universal [place] password for that web site. If you do not have the password, please call the hospital operator.    02/05/2020, 3:59 PM

## 2020-02-05 NOTE — Care Management CC44 (Signed)
Condition Code 44 Documentation Completed  Patient Details  Name: Kristin Haas MRN: [id number] Date of Birth: 07/21/1955   Condition Code 44 given:  Yes Patient signature on Condition Code 44 notice:  Yes Documentation of 2 MD's agreement:  Yes Code 44 added to claim:  Yes    Kingsley Plan, RN 02/05/2020, 2:20 PM

## 2020-02-05 NOTE — TOC CAGE-AID Note (Signed)
Transition of Care [facility]) - CAGE-AID Screening   Patient Details  Name: Kristin Haas MRN: [id number] Date of Birth: [birth year]  Transition of Care [facility]): Ripley Fraise D, RN, TRN Phone Number: 02/05/2020, 10:03 AM   CAGE-AID Screening:    Have You Ever Felt You Ought to Cut Down on Your Drinking or Drug Use?: No Have People Annoyed You By [employer] Your Drinking Or Drug Use?: No Have You Felt Bad Or Guilty About Your Drinking Or Drug Use?: No Have You Ever Had a Drink or Used Drugs First Thing In The Morning to Steady Your Nerves or to Get Rid of a Hangover?: No CAGE-AID Score: 0

## 2020-02-05 NOTE — Care Management Obs Status (Signed)
MEDICARE OBSERVATION STATUS NOTIFICATION   Patient Details  Name: Kristin Haas MRN: [id number] Date of Birth: 1955-12-18   Medicare Observation Status Notification Given:  Yes    Kingsley Plan, RN 02/05/2020, 2:20 PM

## 2020-02-05 NOTE — Discharge Instructions (Signed)
Hematoma A hematoma is a collection of blood under the skin, in an organ, in a body space, in a joint space, or in other tissue. The blood can thicken (clot) to form a lump that you can see and feel. The lump is often firm and may become sore and tender. Most hematomas get better in a few days to weeks. However, some hematomas may be serious and require medical care. Hematomas can range from very small to very large. What are the causes? This condition is caused by:  A blunt or penetrating injury.  A leakage from a blood vessel under the skin.  Some medical procedures, including surgeries, such as oral surgery, face lifts, and surgeries on the joints.  Some medical conditions that cause bleeding or bruising. There may be multiple hematomas that appear in different areas of the body. What increases the risk? You are more likely to develop this condition if:  You are an older adult.  You use blood thinners. What are the signs or symptoms? Symptoms of this condition depend on where the hematoma is located.  Common symptoms of a hematoma that is under the skin include:  A firm lump on the body.  Pain and tenderness in the area.  Bruising. Blue, dark blue, purple-red, or yellowish skin (discoloration) may appear at the site of the hematoma if the hematoma is close to the surface of the skin. Common symptoms of a hematoma that is deep in the tissues or body spaces may be less obvious. They include:  A collection of blood in the stomach (intra-abdominal hematoma). This may cause pain in the abdomen, weakness, fainting, and shortness of breath.  A collection of blood in the head (intracranial hematoma). This may cause a headache or symptoms such as weakness, trouble speaking or understanding, or a change in consciousness.   How is this diagnosed? This condition is diagnosed based on:  Your medical history.  A physical exam.  Imaging tests, such as an ultrasound or CT scan. These may  be needed if your health care provider suspects a hematoma in deeper tissues or body spaces.  Blood tests. These may be needed if your health care provider believes that the hematoma is caused by a medical condition. How is this treated? Treatment for this condition depends on the cause, size, and location of the hematoma. Treatment may include:  Doing nothing. The majority of hematomas do not need treatment as many of them go away on their own over time.  Surgery or close monitoring. This may be needed for large hematomas or hematomas that affect vital organs.  Medicines. Medicines may be given if there is an underlying medical cause for the hematoma. Follow these instructions at home: Managing pain, stiffness, and swelling  If directed, put ice on the affected area. ? Put ice in a plastic bag. ? Place a towel between your skin and the bag. ? Leave the ice on for 20 minutes, 2-3 times a day for the first couple of days.  If directed, apply heat to the affected area after applying ice for a couple of days. Use the heat source that your health care provider recommends, such as a moist heat pack or a heating pad. ? Place a towel between your skin and the heat source. ? Leave the heat on for 20-30 minutes. ? Remove the heat if your skin turns bright red. This is especially important if you are unable to feel pain, heat, or cold. You may have a greater   risk of getting burned.  Raise (elevate) the affected area above the level of your heart while you are sitting or lying down.  If told, wrap the affected area with an elastic bandage. The bandage applies pressure (compression) to the area, which may help to reduce swelling and promote healing. Do not wrap the bandage too tightly around the affected area.  If your hematoma is on a leg or foot (lower extremity) and is painful, your health care provider may recommend crutches. Use them as told by your health care provider.   General  instructions  Take over-the-counter and prescription medicines only as told by your health care provider.  Keep all follow-up visits as told by your health care provider. This is important. Contact a health care provider if:  You have a fever.  The swelling or discoloration gets worse.  You develop more hematomas. Get help right away if:  Your pain is worse or your pain is not controlled with medicine.  Your skin over the hematoma breaks or starts bleeding.  Your hematoma is in your chest or abdomen and you have weakness, shortness of breath, or a change in consciousness.  You have a hematoma on your scalp that is caused by a fall or injury, and you also have: ? A headache that gets worse. ? Trouble speaking or understanding speech. ? Weakness. ? Change in alertness or consciousness. Summary  A hematoma is a collection of blood under the skin, in an organ, in a body space, in a joint space, or in other tissue.  This condition usually does not need treatment because many hematomas go away on their own over time.  Large hematomas, or those that may affect vital organs, may need surgical drainage or monitoring. If the hematoma is caused by a medical condition, medicines may be prescribed.  Get help right away if your hematoma breaks or starts to bleed, you have shortness of breath, or you have a headache or trouble speaking after a fall. This information is not intended to replace advice given to you by your health care provider. Make sure you discuss any questions you have with your health care provider. Document Revised: 05/29/2018 Document Reviewed: 06/07/2017 Elsevier Patient Education  2021 [employer]  

## 2020-02-05 NOTE — ED Notes (Signed)
Attempted report x 2 

## 2020-02-06 DIAGNOSIS — S301XXA Contusion of abdominal wall, initial encounter: Secondary | ICD-10-CM | POA: Diagnosis not present

## 2020-02-06 LAB — CBC
HCT: 28.5 % — ABNORMAL LOW (ref 36.0–46.0)
Hemoglobin: 8.6 g/dL — ABNORMAL LOW (ref 12.0–15.0)
MCH: 26.4 pg (ref 26.0–34.0)
MCHC: 30.2 g/dL (ref 30.0–36.0)
MCV: 87.4 fL (ref 80.0–100.0)
Platelets: 173 10*3/uL (ref 150–400)
RBC: 3.26 MIL/uL — ABNORMAL LOW (ref 3.87–5.11)
RDW: 15.5 % (ref 11.5–15.5)
WBC: 8.9 10*3/uL (ref 4.0–10.5)
nRBC: 0 % (ref 0.0–0.2)

## 2020-02-06 LAB — BASIC METABOLIC PANEL
Anion gap: 10 (ref 5–15)
BUN: 13 mg/dL (ref 8–23)
CO2: 24 mmol/L (ref 22–32)
Calcium: 9 mg/dL (ref 8.9–10.3)
Chloride: 105 mmol/L (ref 98–111)
Creatinine, Ser: 0.73 mg/dL (ref 0.44–1.00)
GFR, Estimated: >60 mL/min (ref >60)
Glucose, Bld: 105 mg/dL — ABNORMAL HIGH (ref 70–99)
Potassium: 4.2 mmol/L (ref 3.5–5.1)
Sodium: 139 mmol/L (ref 135–145)

## 2020-02-06 LAB — PROTIME-INR
INR: 1.1 (ref 0.8–1.2)
Prothrombin Time: 13.8 seconds (ref 11.4–15.2)

## 2020-02-06 MED ORDER — METOPROLOL SUCCINATE ER 25 MG PO TB24
12.5000 mg | ORAL_TABLET | Freq: Every day | ORAL | Status: DC
  Administered 2020-02-06 – 2020-02-07 (×2): 12.5 mg via ORAL
  Filled 2020-02-06 (×2): qty 1

## 2020-02-06 NOTE — Progress Notes (Signed)
PROGRESS NOTE    Kristin Haas  PPI:[id number] DOB: [birth year] DOA: 02/04/2020 PCP: Margaretann Loveless, MD    Chief Complaint  Patient presents with  . Chest Injury  . Abdominal Pain    Brief Narrative:  Kristin Haas  is a 65 y.o. female, with history of afib on Coumadin, HTN, and HLD presents to the ED with a chief complaint of abdominal pain. Patient reports that she was bending over to sit in a chair when she hit her right upper quadrant (directly under right breast) on the edge of the table - 5 days ago. 4 days ago she started feeling dull pain in that area. 3 days ago her pain was getting gradually worse, and more noticeable with position changes. 2 days ago she started to notice bruising. Then 1 day ago, she noticed swelling on the right side of the abdomen.   Subjective:  bp low normal, hgb dropping, still has pain, requiring prn analgesics, she is now having abdominal binder on, states it helped  Twin sister at bedside   Assessment & Plan:   Principal Problem:   Rectus sheath hematoma, initial encounter Active Problems:   A-fib (HCC)   Supratherapeutic INR   HTN (hypertension)   HLD (hyperlipidemia)  Rectus sheath hematoma with supratherapeutic INR, acute blood loss anemia -Trauma and interventional radiology consulted, no intervention recommended currently -coumadin held, received vitk and Kcentra -INR today is 1.1 -Abdominal binder -Pain control -hgb dropping, repeat cbc in am  HTN: bp low normal, hold lisinopril, decrease metoprolol with holding parameters  afib Continue metoprolol, hold comadin  Hypokalemia, replaced and normalized   DVT prophylaxis: SCDs Start: 02/04/20 2112   Code Status:full Family Communication: sister at bedside on 1/20 Disposition:   Status is: Observation  Dispo: The patient is from: home               Anticipated d/c is to: home              Anticipated d/c date is: To be determined              Patient currently is not  medically ready to discharge  Consultants:   Trauma   IR  Procedures:   none  Antimicrobials:   none     Objective: Vitals:   02/05/20 0540 02/05/20 1425 02/05/20 2013 02/06/20 0500  BP: 119/61 (!) 97/48 (!) 104/55   Pulse: 63 (!) 57 (!) 54   Resp: 20 16 16    Temp: 98.4 F (36.9 C) 98.4 F (36.9 C) 99.5 F (37.5 C)   TempSrc: Oral Oral Oral   SpO2: 98% 98% 97%   Weight:    68.6 kg    Intake/Output Summary (Last 24 hours) at 02/06/2020 1258 Last data filed at 02/06/2020 02/08/2020 Gross per 24 hour  Intake 300 ml  Output --  Net 300 ml   Filed Weights   02/05/20 0500 02/06/20 0500  Weight: 69.1 kg 68.6 kg    Examination:  General exam: calm, NAD Respiratory system: Clear to auscultation. Respiratory effort normal. Cardiovascular system: S1 & S2 heard, RRR. No JVD, no murmur, No pedal edema. Gastrointestinal system: right side Abdomen slightly distended, tender, epigastric ecchymosis,  Normal bowel sounds heard. Central nervous system: Alert and oriented. No focal neurological deficits. Extremities: Symmetric 5 x 5 power. Skin: No rashes, lesions or ulcers Psychiatry: Judgement and insight appear normal. Mood & affect appropriate.     Data Reviewed: I have personally reviewed following labs and  imaging studies  CBC: Recent Labs  Lab 02/04/20 1525 02/04/20 2014 02/05/20 0409 02/05/20 0906 02/06/20 0051  WBC 10.5  --  11.5*  --  8.9  NEUTROABS 8.9*  --   --   --   --   HGB 10.4* 12.8 10.1* 9.3* 8.6*  HCT 34.2* 39.4 30.8* 28.7* 28.5*  MCV 86.4  --  83.9  --  87.4  PLT 191  --  196  --  173    Basic Metabolic Panel: Recent Labs  Lab 02/04/20 1525 02/05/20 0409 02/06/20 0051  NA 140 139 139  K 3.9 3.2* 4.2  CL 107 104 105  CO2 24 26 24   GLUCOSE 118* 122* 105*  BUN 16 14 13   CREATININE 0.75 0.73 0.73  CALCIUM 8.9 9.0 9.0  MG  --  2.1  --     GFR: CrCl cannot be calculated (Unknown ideal weight.).  Liver Function Tests: Recent Labs  Lab  02/04/20 1525 02/05/20 0409  AST 17 19  ALT 20 19  ALKPHOS 87 84  BILITOT 0.5 1.0  PROT 6.1* 6.2*  ALBUMIN 3.5 3.4*    CBG: No results for input(s): GLUCAP in the last 168 hours.   Recent Results (from the past 240 hour(s))  SARS CORONAVIRUS 2 (TAT 6-24 HRS) Nasopharyngeal Nasopharyngeal Swab     Status: None   Collection Time: 02/04/20  7:32 PM   Specimen: Nasopharyngeal Swab  Result Value Ref Range Status   SARS Coronavirus 2 NEGATIVE NEGATIVE Final    Comment: (NOTE) SARS-CoV-2 target nucleic acids are NOT DETECTED.  The SARS-CoV-2 RNA is generally detectable in upper and lower respiratory specimens during the acute phase of infection. Negative results do not preclude SARS-CoV-2 infection, do not rule out co-infections with other pathogens, and should not be used as the sole basis for treatment or other patient management decisions. Negative results must be combined with clinical observations, patient history, and epidemiological information. The expected result is Negative.  Fact Sheet for Patients: 02/07/20  Fact Sheet for Healthcare Providers: 02/06/20  This test is not yet approved or cleared by the [employer] FDA and  has been authorized for detection and/or diagnosis of SARS-CoV-2 by FDA under an Emergency Use Authorization (EUA). This EUA will remain  in effect (meaning this test can be used) for the duration of the COVID-19 declaration under Se ction 564(b)(1) of the Act, 21 U.S.C. section 360bbb-3(b)(1), unless the authorization is terminated or revoked sooner.  Performed at [facility], [address] [place], [address] [place]          Radiology Studies: DG Ribs Unilateral W/Chest Right  Result Date: 02/04/2020 CLINICAL DATA:  Chest injury.  Right anterior rib pain. EXAM: RIGHT RIBS AND CHEST - 3+ VIEW COMPARISON:  None. FINDINGS: No fracture or other bone lesions are  seen involving the ribs. There is no evidence of pneumothorax or pleural effusion. Both lungs are clear. Heart size and mediastinal contours are within normal limits. IMPRESSION: Negative. Electronically Signed   By: [id number]   On: 02/04/2020 13:35   CT ABDOMEN PELVIS W CONTRAST  Addendum Date: 02/05/2020   ADDENDUM REPORT: 02/04/2020 18:43 ADDENDUM: These results were called by telephone at the time of interpretation on 02/04/2020 at 6:43 pm to provider 02/06/2020 , who verbally acknowledged these results. Electronically Signed   By: 02/06/2020 M.D.   On: 02/04/2020 18:43   Result Date: 02/04/2020 CLINICAL DATA:  Patient fell and hit side  of ribs. Presenting with pain radiating downwwards. Bruise. Abdominal trauma. EXAM: CT ABDOMEN AND PELVIS WITH CONTRAST TECHNIQUE: Multidetector CT imaging of the abdomen and pelvis was performed using the standard protocol following bolus administration of intravenous contrast. CONTRAST:  OMNIPAQUE IOHEXOL 300 MG/ML  SOLN COMPARISON:  None. FINDINGS: Lower chest: No acute abnormality. Hepatobiliary: A 1.5 cm fluid density lesion within the right hepatic lobe. Subcentimeter hypodensities are too small to characterize. No gallstones, gallbladder wall thickening, or pericholecystic fluid. No biliary dilatation. Pancreas: No focal lesion. Normal pancreatic contour. No surrounding inflammatory changes. No main pancreatic ductal dilatation. Spleen: Normal in size without focal abnormality. Adrenals/Urinary Tract: No adrenal nodule bilaterally. Bilateral kidneys enhance symmetrically. No hydronephrosis. No hydroureter. The urinary bladder is unremarkable. Stomach/Bowel: Stomach is within normal limits. No evidence of bowel wall thickening or dilatation. Few scattered sigmoid diverticula. Stool throughout the colon. The appendix not definitely identified. Vascular/Lymphatic: No abdominal aorta or iliac aneurysm. Mild atherosclerotic plaque of the aorta and its  branches. No abdominal, pelvic, or inguinal lymphadenopathy. Reproductive: There is a 1.6 cm hyperdensity within the uterus likely within the endometrial canal. Otherwise the uterus and bilateral adnexa are unremarkable. Other: No intraperitoneal free fluid. No intraperitoneal free gas. No organized fluid collection. Musculoskeletal: Approximately 4 x 7 x 12 cm right rectus sheath hematoma. Active extravasation within the hematoma noted on the delayed imaging (8:5). No suspicious lytic or blastic osseous lesions. No acute displaced fracture. Multilevel mild degenerative changes of the spine. IMPRESSION: 1. A 4 x 7 x 12 cm right rectus sheath hematoma with active intralesional bleeding. 2. A 1.6 cm hyperdensity within the uterus likely within the endometrial canal. Recommend correlation with prior cross-sectional imaging and/or pelvic ultrasound. If not available, considering patient is postmenopausal, recommend pelvic ultrasound for further evaluation. 3. Scattered sigmoid diverticulosis with no acute diverticulitis. Electronically Signed: By: Tish Frederickson M.D. On: 02/04/2020 18:37        Scheduled Meds: . amiodarone  200 mg Oral Daily  . melatonin  10 mg Oral QHS  . metoprolol succinate  12.5 mg Oral Daily  . nicotine  14 mg Transdermal Daily  . rosuvastatin  40 mg Oral Daily  . senna-docusate  1 tablet Oral QHS   Continuous Infusions:   LOS: 1 day   Time spent: Greater than 50% of this time was spent in counseling, explanation of diagnosis, planning of further management, and coordination of care.  I have personally reviewed and interpreted on  02/06/2020 daily labs, I reviewed all nursing notes, pharmacy notes, consultant notes,  vitals, pertinent old records  I have discussed plan of care as described above with RN , patient  on 02/06/2020  Voice Recognition /Dragon dictation system was used to create this note, attempts have been made to correct errors. Please contact the  author with questions and/or clarifications.   Albertine Grates, MD PhD FACP [facility]  Available via Epic secure chat 7am-7pm for nonurgent issues Please page for urgent issues To page the attending provider between 7A-7P or the covering provider during after hours 7P-7A, please log into the web site [email or web address] and access using universal  password for that web site. If you do not have the password, please call the hospital operator.    02/06/2020, 12:58 PM

## 2020-02-06 NOTE — Plan of Care (Signed)
Patient with abdominal binder in place and tolerating well. Ambulating to the bathroom as needed.  Problem: Education: Goal: Knowledge of General Education information will improve Description: Including pain rating scale, medication(s)/side effects and non-pharmacologic comfort measures Outcome: Progressing   Problem: Health Behavior/Discharge Planning: Goal: Ability to manage health-related needs will improve Outcome: Progressing   Problem: Clinical Measurements: Goal: Ability to maintain clinical measurements within normal limits will improve Outcome: Progressing Goal: Will remain free from infection Outcome: Progressing Goal: Diagnostic test results will improve Outcome: Progressing Goal: Respiratory complications will improve Outcome: Progressing Goal: Cardiovascular complication will be avoided Outcome: Progressing   Problem: Activity: Goal: Risk for activity intolerance will decrease Outcome: Progressing   Problem: Nutrition: Goal: Adequate nutrition will be maintained Outcome: Progressing   Problem: Coping: Goal: Level of anxiety will decrease Outcome: Progressing   Problem: Elimination: Goal: Will not experience complications related to bowel motility Outcome: Progressing Goal: Will not experience complications related to urinary retention Outcome: Progressing   Problem: Pain Managment: Goal: General experience of comfort will improve Outcome: Progressing   Problem: Safety: Goal: Ability to remain free from injury will improve Outcome: Progressing   Problem: Skin Integrity: Goal: Risk for impaired skin integrity will decrease Outcome: Progressing

## 2020-02-07 DIAGNOSIS — S301XXA Contusion of abdominal wall, initial encounter: Secondary | ICD-10-CM | POA: Diagnosis not present

## 2020-02-07 LAB — CBC
HCT: 28.3 % — ABNORMAL LOW (ref 36.0–46.0)
Hemoglobin: 8.6 g/dL — ABNORMAL LOW (ref 12.0–15.0)
MCH: 26.5 pg (ref 26.0–34.0)
MCHC: 30.4 g/dL (ref 30.0–36.0)
MCV: 87.1 fL (ref 80.0–100.0)
Platelets: 168 10*3/uL (ref 150–400)
RBC: 3.25 MIL/uL — ABNORMAL LOW (ref 3.87–5.11)
RDW: 15.4 % (ref 11.5–15.5)
WBC: 6.8 10*3/uL (ref 4.0–10.5)
nRBC: 0 % (ref 0.0–0.2)

## 2020-02-07 MED ORDER — POLYETHYLENE GLYCOL 3350 17 G PO PACK: 17.0000 g | PACK | Freq: Every day | ORAL | 0 refills | Status: DC | PRN

## 2020-02-07 MED ORDER — WARFARIN SODIUM 2 MG PO TABS: 2.0000 mg | ORAL_TABLET | Freq: Every day | ORAL | Status: DC

## 2020-02-07 MED ORDER — NICOTINE 14 MG/24HR TD PT24: 14.0000 mg | MEDICATED_PATCH | Freq: Every day | TRANSDERMAL | 0 refills | Status: DC

## 2020-02-07 MED ORDER — BISACODYL 10 MG RE SUPP
10.0000 mg | Freq: Once | RECTAL | Status: AC
  Administered 2020-02-07: 10 mg via RECTAL
  Filled 2020-02-07 (×2): qty 1

## 2020-02-07 MED ORDER — LOSARTAN POTASSIUM 50 MG PO TABS: 50.0000 mg | ORAL_TABLET | Freq: Every day | ORAL | Status: DC

## 2020-02-07 MED ORDER — SENNOSIDES-DOCUSATE SODIUM 8.6-50 MG PO TABS: 1.0000 | ORAL_TABLET | Freq: Every day | ORAL | Status: DC

## 2020-02-07 MED ORDER — HYDROCODONE-ACETAMINOPHEN 5-325 MG PO TABS
1.0000 | ORAL_TABLET | ORAL | 0 refills | Status: DC | PRN
Start: 2020-02-07 — End: 2021-07-29

## 2020-02-07 NOTE — Progress Notes (Signed)
Vanice Sarah to be D/C'd  per MD order. Discussed with the patient and all questions fully answered.  VSS, Skin clean, dry and intact without evidence of skin break down, no evidence of skin tears noted.  IV catheter discontinued intact. Site without signs and symptoms of complications. Dressing and pressure applied.  An After Visit Summary was printed and given to the patient. Patient received prescription.  D/c education completed with patient/family including follow up instructions, medication list, d/c activities limitations if indicated, with other d/c instructions as indicated by MD - patient able to verbalize understanding, all questions fully answered.   Patient instructed to return to ED, call 911, or call MD for any changes in condition.   Patient to be escorted via WC, and D/C home via private auto.

## 2020-02-07 NOTE — Discharge Summary (Signed)
Discharge Summary  Kristin Haas ZOX:[id number] DOB: [birth year]  PCP: Margaretann LovelessKhan, Neelam S, MD  Admit date: 02/04/2020 Discharge date: 02/07/2020  Time spent: 30mins  Recommendations for Outpatient Follow-up:  1. F/u with PCP Dr Welton FlakesKhan on 1/27 for hospital discharge follow up, repeat cbc/bmp at follow up. Dr Welton FlakesKhan to decide on when to resume anticoagulation   Discharge Diagnoses:  Active Hospital Problems   Diagnosis Date Noted  . Rectus sheath hematoma, initial encounter 02/04/2020  . A-fib (HCC) 02/04/2020  . Supratherapeutic INR 02/04/2020  . HTN (hypertension) 02/04/2020  . HLD (hyperlipidemia) 02/04/2020    Resolved Hospital Problems  No resolved problems to display.    Discharge Condition: stable  Diet recommendation: heart healthy  Filed Weights   02/05/20 0500 02/06/20 0500  Weight: 69.1 kg 68.6 kg    History of present illness: ( per admitting provider Asia Zierle-Ghosh ) Kristin Haas  is a 65 y.o. female, with history of afib, HTN, and HLD presents to the ED with a chief complaint of abdominal pain. Patient reports that she was bending over to sit in a chair when she hit her right upper quadrant (directly under right breast) on the edge of the table - 5 days ago. 4 days ago she started feeling dull pain in that area. 3 days ago her pain was getting gradually worse, and more noticeable with position changes. 2 days ago she started to notice bruising. Then 1 day ago, she noticed swelling on the right side of the abdomen. She reports that the pain now feels like a pressure, but it is sharp when she has positional changes. Patient reports that it is different than when she has had broken ribs in the past. When she had a broken rib, she had pain with deep breath. For this event, she has pain when bearing down, but taking a deep breath does not bother it. Patient reports that she came in to the ED because she started to have orthostatic symptoms. She reports that three separate times  today, when she stood up, she felt dizzy, flushed, nauseous, had palpitations, and felt near syncope. She has also had paresthesias in her finertips.   Patient is on coumadin for afib. Her last INR check was last week and was 4.4. She reports that her PCP called her two days ago and advised her to reduce her dose of warfarin. She has been taking it per her PCP instructions.   Patient has had considerable stress recently. Her son died in December with Stage IV lung cancer.   Patient smokes 1/2 ppd and is trying to quit. She would like nicotine patch. She does not drink EtOH or use illicit drugs. She is vaxed for covid. She is Full code.   In the ED T 98.2, HR 50-63, R 17-22, BP 133/67, 9-100% Hgb 10.4 Chem is unremarkable A pos blood type CT abdomen shows 4x7x12cm rt rectus sheath hematoma with intralesional bleeding. Other incidental findings on CT as well - see below XR ribs - Negative Fentatnyl. Oxy, Vit K, and K centra given in ED Trauma and IR consulted from ED  Hospital Course:  Principal Problem:   Rectus sheath hematoma, initial encounter Active Problems:   A-fib (HCC)   Supratherapeutic INR   HTN (hypertension)   HLD (hyperlipidemia)   Rectus sheath hematoma with supratherapeutic INR, acute blood loss anemia -Trauma and interventional radiology consulted, no intervention recommended  -coumadin held, received vitk and Kcentra -INR down to 1.1 -Abdominal binder -Pain control -hgb  dropped to 8.6 and stayed at 8.6, reports less pain, desires to go home, d/c home and follow up with pcp next week , repeat cbc at follow up, pcp to decide on when to resume anticoagulation  -prn norco prescribed for pain control  HTN: bp low normal in the hospital,  lisinopril held in the hospital , recommend resume in three days She received lower dose of metoprolol in the hospital, home does metoprolol resumed at discharge She is advised to check her blood pressure twice  A day and bring  in record to hospital discharge follow up appointment  afib Continue metoprolol, hold comadin  Hypokalemia, replaced and normalized  H/o IBS with constipation Stool softener    DVT prophylaxis: SCDs Start: 02/04/20 2112   Code Status:full Family Communication: sister at bedside on 1/20 Disposition: home   Consultants:   Trauma   IR  Procedures:   none  Antimicrobials:   none   Discharge Exam: BP (!) 110/56 (BP Location: Left Arm)   Pulse 60   Temp 98.3 F (36.8 C) (Oral)   Resp 17   Wt 68.6 kg   SpO2 98%   BMI 24.41 kg/m   General: NAD Cardiovascular: RRR Respiratory: normal respiratory effort   Discharge Instructions You were cared for by a hospitalist during your hospital stay. If you have any questions about your discharge medications or the care you received while you were in the hospital after you are discharged, you can call the unit and asked to speak with the hospitalist on call if the hospitalist that took care of you is not available. Once you are discharged, your primary care physician will handle any further medical issues. Please note that NO REFILLS for any discharge medications will be authorized once you are discharged, as it is imperative that you return to your primary care physician (or establish a relationship with a primary care physician if you do not have one) for your aftercare needs so that they can reassess your need for medications and monitor your lab values.  Discharge Instructions    Diet - low sodium heart healthy   Complete by: As directed    Increase activity slowly   Complete by: As directed      Allergies as of 02/07/2020      Reactions   Codeine Nausea And Vomiting      Medication List    TAKE these medications   acetaminophen 500 MG tablet Commonly known as: TYLENOL Take 500 mg by mouth every 6 (six) hours as needed for mild pain.   amiodarone 200 MG tablet Commonly known as: PACERONE Take 200 mg by  mouth daily.   HYDROcodone-acetaminophen 5-325 MG tablet Commonly known as: Norco Take 1 tablet by mouth every 4 (four) hours as needed for moderate pain.   losartan 50 MG tablet Commonly known as: COZAAR Take 1 tablet (50 mg total) by mouth daily. Hold for three days,  check your blood pressure twice a day , bring in record for your pcp to review. Start taking on: February 11, 2020 What changed:   additional instructions  These instructions start on February 11, 2020. If you are unsure what to do until then, ask your doctor or other care provider.   Melatonin 10 MG Tabs Take 1 tablet by mouth at bedtime.   metoprolol succinate 25 MG 24 hr tablet Commonly known as: TOPROL-XL Take 25 mg by mouth daily.   nicotine 14 mg/24hr patch Commonly known as: NICODERM CQ -  dosed in mg/24 hours Place 1 patch (14 mg total) onto the skin daily.   polyethylene glycol 17 g packet Commonly known as: MIRALAX / GLYCOLAX Take 17 g by mouth daily as needed for mild constipation.   rosuvastatin 40 MG tablet Commonly known as: CRESTOR Take 40 mg by mouth daily.   senna-docusate 8.6-50 MG tablet Commonly known as: Senokot-S Take 1 tablet by mouth at bedtime.   warfarin 2 MG tablet Commonly known as: COUMADIN Take 1 tablet (2 mg total) by mouth daily. Hold coumadin for now, repeat cbc by your pcp next week, pcp to decide when to resume coumadin What changed: additional instructions      Allergies  Allergen Reactions  . Codeine Nausea And Vomiting    Follow-up Information    Margaretann LovelessKhan, Neelam S, MD. Schedule an appointment as soon as possible for a visit on 02/12/2020.   Specialty: Internal Medicine Why: repeat cbc at follow up, discuss with pcp regarding resuming anticoagulation  Contact information: [address] [place] [address] [phone]                The results of significant diagnostics from this hospitalization (including imaging, microbiology, ancillary and  laboratory) are listed below for reference.    Significant Diagnostic Studies: DG Ribs Unilateral W/Chest Right  Result Date: 02/04/2020 CLINICAL DATA:  Chest injury.  Right anterior rib pain. EXAM: RIGHT RIBS AND CHEST - 3+ VIEW COMPARISON:  None. FINDINGS: No fracture or other bone lesions are seen involving the ribs. There is no evidence of pneumothorax or pleural effusion. Both lungs are clear. Heart size and mediastinal contours are within normal limits. IMPRESSION: Negative. Electronically Signed   By: Elige KoHetal  Patel   On: 02/04/2020 13:35   CT ABDOMEN PELVIS W CONTRAST  Addendum Date: 02/05/2020   ADDENDUM REPORT: 02/04/2020 18:43 ADDENDUM: These results were called by telephone at the time of interpretation on 02/04/2020 at 6:43 pm to provider Pricilla LovelessSCOTT GOLDSTON , who verbally acknowledged these results. Electronically Signed   By: Tish FredericksonMorgane  Naveau M.D.   On: 02/04/2020 18:43   Result Date: 02/04/2020 CLINICAL DATA:  Patient fell and hit side of ribs. Presenting with pain radiating downwwards. Bruise. Abdominal trauma. EXAM: CT ABDOMEN AND PELVIS WITH CONTRAST TECHNIQUE: Multidetector CT imaging of the abdomen and pelvis was performed using the standard protocol following bolus administration of intravenous contrast. CONTRAST:  100mL OMNIPAQUE IOHEXOL 300 MG/ML  SOLN COMPARISON:  None. FINDINGS: Lower chest: No acute abnormality. Hepatobiliary: A 1.5 cm fluid density lesion within the right hepatic lobe. Subcentimeter hypodensities are too small to characterize. No gallstones, gallbladder wall thickening, or pericholecystic fluid. No biliary dilatation. Pancreas: No focal lesion. Normal pancreatic contour. No surrounding inflammatory changes. No main pancreatic ductal dilatation. Spleen: Normal in size without focal abnormality. Adrenals/Urinary Tract: No adrenal nodule bilaterally. Bilateral kidneys enhance symmetrically. No hydronephrosis. No hydroureter. The urinary bladder is unremarkable.  Stomach/Bowel: Stomach is within normal limits. No evidence of bowel wall thickening or dilatation. Few scattered sigmoid diverticula. Stool throughout the colon. The appendix not definitely identified. Vascular/Lymphatic: No abdominal aorta or iliac aneurysm. Mild atherosclerotic plaque of the aorta and its branches. No abdominal, pelvic, or inguinal lymphadenopathy. Reproductive: There is a 1.6 cm hyperdensity within the uterus likely within the endometrial canal. Otherwise the uterus and bilateral adnexa are unremarkable. Other: No intraperitoneal free fluid. No intraperitoneal free gas. No organized fluid collection. Musculoskeletal: Approximately 4 x 7 x 12 cm right rectus sheath hematoma. Active extravasation within the hematoma  noted on the delayed imaging (8:5). No suspicious lytic or blastic osseous lesions. No acute displaced fracture. Multilevel mild degenerative changes of the spine. IMPRESSION: 1. A 4 x 7 x 12 cm right rectus sheath hematoma with active intralesional bleeding. 2. A 1.6 cm hyperdensity within the uterus likely within the endometrial canal. Recommend correlation with prior cross-sectional imaging and/or pelvic ultrasound. If not available, considering patient is postmenopausal, recommend pelvic ultrasound for further evaluation. 3. Scattered sigmoid diverticulosis with no acute diverticulitis. Electronically Signed: By: Tish Frederickson M.D. On: 02/04/2020 18:37    Microbiology: Recent Results (from the past 240 hour(s))  SARS CORONAVIRUS 2 (TAT 6-24 HRS) Nasopharyngeal Nasopharyngeal Swab     Status: None   Collection Time: 02/04/20  7:32 PM   Specimen: Nasopharyngeal Swab  Result Value Ref Range Status   SARS Coronavirus 2 NEGATIVE NEGATIVE Final    Comment: (NOTE) SARS-CoV-2 target nucleic acids are NOT DETECTED.  The SARS-CoV-2 RNA is generally detectable in upper and lower respiratory specimens during the acute phase of infection. Negative results do not preclude  SARS-CoV-2 infection, do not rule out co-infections with other pathogens, and should not be used as the sole basis for treatment or other patient management decisions. Negative results must be combined with clinical observations, patient history, and epidemiological information. The expected result is Negative.  Fact Sheet for Patients: HairSlick.no  Fact Sheet for Healthcare Providers: quierodirigir.com  This test is not yet approved or cleared by the [place] FDA and  has been authorized for detection and/or diagnosis of SARS-CoV-2 by FDA under an Emergency Use Authorization (EUA). This EUA will remain  in effect (meaning this test can be used) for the duration of the COVID-19 declaration under Se ction 564(b)(1) of the Act, 21 U.S.C. section 360bbb-3(b)(1), unless the authorization is terminated or revoked sooner.  Performed at [facility], [address], [place], [place] [address]      Labs: Basic Metabolic Panel: Recent Labs  Lab 02/04/20 1525 02/05/20 0409 02/06/20 0051  NA 140 139 139  K 3.9 3.2* 4.2  CL 107 104 105  CO2 24 26 24   GLUCOSE 118* 122* 105*  BUN 16 14 13   CREATININE 0.75 0.73 0.73  CALCIUM 8.9 9.0 9.0  MG  --  2.1  --    Liver Function Tests: Recent Labs  Lab 02/04/20 1525 02/05/20 0409  AST 17 19  ALT 20 19  ALKPHOS 87 84  BILITOT 0.5 1.0  PROT 6.1* 6.2*  ALBUMIN 3.5 3.4*   No results for input(s): LIPASE, AMYLASE in the last 168 hours. No results for input(s): AMMONIA in the last 168 hours. CBC: Recent Labs  Lab 02/04/20 1525 02/04/20 2014 02/05/20 0409 02/05/20 0906 02/06/20 0051 02/07/20 0058  WBC 10.5  --  11.5*  --  8.9 6.8  NEUTROABS 8.9*  --   --   --   --   --   HGB 10.4* 12.8 10.1* 9.3* 8.6* 8.6*  HCT 34.2* 39.4 30.8* 28.7* 28.5* 28.3*  MCV 86.4  --  83.9  --  87.4 87.1  PLT 191  --  196  --  173 168   Cardiac Enzymes: No results for input(s):  CKTOTAL, CKMB, CKMBINDEX, TROPONINI in the last 168 hours. BNP: BNP (last 3 results) No results for input(s): BNP in the last 8760 hours.  ProBNP (last 3 results) No results for input(s): PROBNP in the last 8760 hours.  CBG: No results for input(s): GLUCAP in the  last 168 hours.     Signed:  Albertine Grates MD, PhD, FACP  [facility] 02/07/2020, 1:04 PM

## 2021-03-31 DIAGNOSIS — I10 Essential (primary) hypertension: Secondary | ICD-10-CM | POA: Diagnosis not present

## 2021-03-31 DIAGNOSIS — E785 Hyperlipidemia, unspecified: Secondary | ICD-10-CM | POA: Diagnosis not present

## 2021-03-31 DIAGNOSIS — F411 Generalized anxiety disorder: Secondary | ICD-10-CM | POA: Diagnosis not present

## 2021-03-31 DIAGNOSIS — G4700 Insomnia, unspecified: Secondary | ICD-10-CM | POA: Diagnosis not present

## 2021-03-31 DIAGNOSIS — F32A Depression, unspecified: Secondary | ICD-10-CM | POA: Diagnosis not present

## 2021-04-01 DIAGNOSIS — E785 Hyperlipidemia, unspecified: Secondary | ICD-10-CM | POA: Diagnosis not present

## 2021-04-01 DIAGNOSIS — I2510 Atherosclerotic heart disease of native coronary artery without angina pectoris: Secondary | ICD-10-CM | POA: Diagnosis not present

## 2021-04-01 DIAGNOSIS — G4730 Sleep apnea, unspecified: Secondary | ICD-10-CM | POA: Diagnosis not present

## 2021-04-01 DIAGNOSIS — I10 Essential (primary) hypertension: Secondary | ICD-10-CM | POA: Diagnosis not present

## 2021-04-01 DIAGNOSIS — I340 Nonrheumatic mitral (valve) insufficiency: Secondary | ICD-10-CM | POA: Diagnosis not present

## 2021-04-01 DIAGNOSIS — F17200 Nicotine dependence, unspecified, uncomplicated: Secondary | ICD-10-CM | POA: Diagnosis not present

## 2021-04-01 DIAGNOSIS — I4891 Unspecified atrial fibrillation: Secondary | ICD-10-CM | POA: Diagnosis not present

## 2021-04-26 DIAGNOSIS — I2510 Atherosclerotic heart disease of native coronary artery without angina pectoris: Secondary | ICD-10-CM | POA: Diagnosis not present

## 2021-07-04 DIAGNOSIS — G4733 Obstructive sleep apnea (adult) (pediatric): Secondary | ICD-10-CM | POA: Diagnosis not present

## 2021-07-04 DIAGNOSIS — E785 Hyperlipidemia, unspecified: Secondary | ICD-10-CM | POA: Diagnosis not present

## 2021-07-04 DIAGNOSIS — I10 Essential (primary) hypertension: Secondary | ICD-10-CM | POA: Diagnosis not present

## 2021-07-04 DIAGNOSIS — I2510 Atherosclerotic heart disease of native coronary artery without angina pectoris: Secondary | ICD-10-CM | POA: Diagnosis not present

## 2021-07-04 DIAGNOSIS — F331 Major depressive disorder, recurrent, moderate: Secondary | ICD-10-CM | POA: Diagnosis not present

## 2021-07-04 DIAGNOSIS — F411 Generalized anxiety disorder: Secondary | ICD-10-CM | POA: Diagnosis not present

## 2021-07-04 DIAGNOSIS — R7301 Impaired fasting glucose: Secondary | ICD-10-CM | POA: Diagnosis not present

## 2021-07-04 DIAGNOSIS — R799 Abnormal finding of blood chemistry, unspecified: Secondary | ICD-10-CM | POA: Diagnosis not present

## 2021-07-11 ENCOUNTER — Inpatient Hospital Stay: Payer: Medicare PPO

## 2021-07-11 ENCOUNTER — Inpatient Hospital Stay: Payer: Medicare PPO | Attending: Internal Medicine | Admitting: Internal Medicine

## 2021-07-11 ENCOUNTER — Encounter: Payer: Self-pay | Admitting: Internal Medicine

## 2021-07-11 DIAGNOSIS — D649 Anemia, unspecified: Secondary | ICD-10-CM | POA: Insufficient documentation

## 2021-07-11 DIAGNOSIS — F17210 Nicotine dependence, cigarettes, uncomplicated: Secondary | ICD-10-CM | POA: Insufficient documentation

## 2021-07-11 DIAGNOSIS — I4891 Unspecified atrial fibrillation: Secondary | ICD-10-CM | POA: Diagnosis not present

## 2021-07-11 DIAGNOSIS — D509 Iron deficiency anemia, unspecified: Secondary | ICD-10-CM | POA: Diagnosis not present

## 2021-07-11 DIAGNOSIS — Z808 Family history of malignant neoplasm of other organs or systems: Secondary | ICD-10-CM | POA: Insufficient documentation

## 2021-07-11 DIAGNOSIS — R5383 Other fatigue: Secondary | ICD-10-CM | POA: Insufficient documentation

## 2021-07-11 DIAGNOSIS — R634 Abnormal weight loss: Secondary | ICD-10-CM | POA: Diagnosis not present

## 2021-07-11 DIAGNOSIS — Z7901 Long term (current) use of anticoagulants: Secondary | ICD-10-CM | POA: Insufficient documentation

## 2021-07-11 DIAGNOSIS — R1310 Dysphagia, unspecified: Secondary | ICD-10-CM | POA: Diagnosis not present

## 2021-07-11 DIAGNOSIS — I10 Essential (primary) hypertension: Secondary | ICD-10-CM | POA: Insufficient documentation

## 2021-07-11 DIAGNOSIS — Z801 Family history of malignant neoplasm of trachea, bronchus and lung: Secondary | ICD-10-CM | POA: Insufficient documentation

## 2021-07-14 ENCOUNTER — Inpatient Hospital Stay: Payer: Medicare PPO

## 2021-07-14 VITALS — BP 121/63 | HR 56 | Temp 96.6°F | Resp 16

## 2021-07-14 DIAGNOSIS — R5383 Other fatigue: Secondary | ICD-10-CM | POA: Diagnosis not present

## 2021-07-14 DIAGNOSIS — D649 Anemia, unspecified: Secondary | ICD-10-CM

## 2021-07-14 DIAGNOSIS — I10 Essential (primary) hypertension: Secondary | ICD-10-CM | POA: Diagnosis not present

## 2021-07-14 DIAGNOSIS — I4891 Unspecified atrial fibrillation: Secondary | ICD-10-CM | POA: Diagnosis not present

## 2021-07-14 DIAGNOSIS — Z808 Family history of malignant neoplasm of other organs or systems: Secondary | ICD-10-CM | POA: Diagnosis not present

## 2021-07-14 DIAGNOSIS — R634 Abnormal weight loss: Secondary | ICD-10-CM | POA: Diagnosis not present

## 2021-07-14 DIAGNOSIS — F17210 Nicotine dependence, cigarettes, uncomplicated: Secondary | ICD-10-CM | POA: Diagnosis not present

## 2021-07-14 DIAGNOSIS — R1310 Dysphagia, unspecified: Secondary | ICD-10-CM | POA: Diagnosis not present

## 2021-07-14 DIAGNOSIS — D509 Iron deficiency anemia, unspecified: Secondary | ICD-10-CM | POA: Diagnosis not present

## 2021-07-14 DIAGNOSIS — Z7901 Long term (current) use of anticoagulants: Secondary | ICD-10-CM | POA: Diagnosis not present

## 2021-07-14 MED ORDER — IRON SUCROSE 20 MG/ML IV SOLN
200.0000 mg | Freq: Once | INTRAVENOUS | Status: AC
  Administered 2021-07-14: 200 mg via INTRAVENOUS
  Filled 2021-07-14: qty 10

## 2021-07-14 MED ORDER — SODIUM CHLORIDE 0.9 % IV SOLN: 200.0000 mg | Freq: Once | INTRAVENOUS | Status: DC

## 2021-07-14 MED ORDER — SODIUM CHLORIDE 0.9 % IV SOLN
Freq: Once | INTRAVENOUS | Status: AC
  Filled 2021-07-14: qty 250

## 2021-07-14 NOTE — Patient Instructions (Signed)
[facility]  Discharge Instructions: Thank you for choosing [facility] to provide your oncology and hematology care.  If you have a lab appointment with the Cancer Center, please go directly to the Cancer Center and check in at the registration area.  We strive to give you quality time with your provider. You may need to reschedule your appointment if you arrive late (15 or more minutes).  Arriving late affects you and other patients whose appointments are after yours.  Also, if you miss three or more appointments without notifying the office, you may be dismissed from the clinic at the provider's discretion.      For prescription refill requests, have your pharmacy contact our office and allow 72 hours for refills to be completed.    BELOW ARE SYMPTOMS THAT SHOULD BE REPORTED IMMEDIATELY: *FEVER GREATER THAN 100.4 F (38 C) OR HIGHER *CHILLS OR SWEATING *NAUSEA AND VOMITING THAT IS NOT CONTROLLED WITH YOUR NAUSEA MEDICATION *UNUSUAL SHORTNESS OF BREATH *UNUSUAL BRUISING OR BLEEDING *URINARY PROBLEMS (pain or burning when urinating, or frequent urination) *BOWEL PROBLEMS (unusual diarrhea, constipation, pain near the anus) TENDERNESS IN MOUTH AND THROAT WITH OR WITHOUT PRESENCE OF ULCERS (sore throat, sores in mouth, or a toothache) UNUSUAL RASH, SWELLING OR PAIN  UNUSUAL VAGINAL DISCHARGE OR ITCHING   Items with * indicate a potential emergency and should be followed up as soon as possible or go to the Emergency Department if any problems should occur.  Should you have questions after your visit or need to cancel or reschedule your appointment, please contact [facility]  [phone] and follow the prompts.  Office hours are 8:00 a.m. to 4:30 p.m. Monday - Friday. Please note that voicemails left after 4:00 p.m. may not be returned until the following business day.  We are closed weekends and major holidays.  You have access to a nurse at all times for urgent questions. Please call the main number to the clinic [phone] and follow the prompts.  For any non-urgent questions, you may also contact your provider using MyChart. We now offer e-Visits for anyone 18 and older to request care online for non-urgent symptoms. For details visit [email or web address].   Also download the MyChart app! Go to the app store, search "MyChart", open the app, select [facility], and log in with your MyChart username and password.  Masks are optional in the cancer centers. If you would like for your care team to wear a mask while they are taking care of you, please let them know. For doctor visits, patients may have with them one support person who is at least 67 years old. At this time, visitors are not allowed in the infusion area.   

## 2021-07-21 ENCOUNTER — Inpatient Hospital Stay: Payer: Medicare PPO | Attending: Internal Medicine

## 2021-07-21 VITALS — BP 126/64 | HR 48 | Temp 97.0°F | Resp 16

## 2021-07-21 DIAGNOSIS — I4891 Unspecified atrial fibrillation: Secondary | ICD-10-CM | POA: Diagnosis not present

## 2021-07-21 DIAGNOSIS — R5383 Other fatigue: Secondary | ICD-10-CM | POA: Diagnosis not present

## 2021-07-21 DIAGNOSIS — E7800 Pure hypercholesterolemia, unspecified: Secondary | ICD-10-CM | POA: Insufficient documentation

## 2021-07-21 DIAGNOSIS — Z7901 Long term (current) use of anticoagulants: Secondary | ICD-10-CM | POA: Diagnosis not present

## 2021-07-21 DIAGNOSIS — F17210 Nicotine dependence, cigarettes, uncomplicated: Secondary | ICD-10-CM | POA: Insufficient documentation

## 2021-07-21 DIAGNOSIS — I10 Essential (primary) hypertension: Secondary | ICD-10-CM | POA: Diagnosis not present

## 2021-07-21 DIAGNOSIS — Z801 Family history of malignant neoplasm of trachea, bronchus and lung: Secondary | ICD-10-CM | POA: Insufficient documentation

## 2021-07-21 DIAGNOSIS — D509 Iron deficiency anemia, unspecified: Secondary | ICD-10-CM | POA: Insufficient documentation

## 2021-07-21 DIAGNOSIS — R634 Abnormal weight loss: Secondary | ICD-10-CM | POA: Diagnosis not present

## 2021-07-21 DIAGNOSIS — Z79899 Other long term (current) drug therapy: Secondary | ICD-10-CM | POA: Insufficient documentation

## 2021-07-21 DIAGNOSIS — D649 Anemia, unspecified: Secondary | ICD-10-CM

## 2021-07-21 MED ORDER — SODIUM CHLORIDE 0.9 % IV SOLN
Freq: Once | INTRAVENOUS | Status: AC
  Filled 2021-07-21: qty 250

## 2021-07-21 MED ORDER — SODIUM CHLORIDE 0.9 % IV SOLN
200.0000 mg | Freq: Once | INTRAVENOUS | Status: AC
  Administered 2021-07-21: 200 mg via INTRAVENOUS
  Filled 2021-07-21: qty 10

## 2021-07-26 ENCOUNTER — Other Ambulatory Visit: Payer: Self-pay | Admitting: Internal Medicine

## 2021-07-26 DIAGNOSIS — R634 Abnormal weight loss: Secondary | ICD-10-CM

## 2021-07-26 DIAGNOSIS — D649 Anemia, unspecified: Secondary | ICD-10-CM

## 2021-07-28 ENCOUNTER — Ambulatory Visit: Admission: RE | Admit: 2021-07-28 | Payer: Medicare PPO | Source: Ambulatory Visit

## 2021-07-29 ENCOUNTER — Inpatient Hospital Stay: Payer: Medicare PPO

## 2021-07-29 ENCOUNTER — Inpatient Hospital Stay (HOSPITAL_BASED_OUTPATIENT_CLINIC_OR_DEPARTMENT_OTHER): Payer: Medicare PPO | Admitting: Internal Medicine

## 2021-07-29 DIAGNOSIS — D649 Anemia, unspecified: Secondary | ICD-10-CM

## 2021-07-29 DIAGNOSIS — R5383 Other fatigue: Secondary | ICD-10-CM | POA: Diagnosis not present

## 2021-07-29 DIAGNOSIS — F17210 Nicotine dependence, cigarettes, uncomplicated: Secondary | ICD-10-CM | POA: Diagnosis not present

## 2021-07-29 DIAGNOSIS — E7800 Pure hypercholesterolemia, unspecified: Secondary | ICD-10-CM | POA: Diagnosis not present

## 2021-07-29 DIAGNOSIS — I10 Essential (primary) hypertension: Secondary | ICD-10-CM | POA: Diagnosis not present

## 2021-07-29 DIAGNOSIS — D509 Iron deficiency anemia, unspecified: Secondary | ICD-10-CM | POA: Diagnosis not present

## 2021-07-29 DIAGNOSIS — Z7901 Long term (current) use of anticoagulants: Secondary | ICD-10-CM | POA: Diagnosis not present

## 2021-07-29 DIAGNOSIS — R634 Abnormal weight loss: Secondary | ICD-10-CM | POA: Diagnosis not present

## 2021-07-29 DIAGNOSIS — I4891 Unspecified atrial fibrillation: Secondary | ICD-10-CM | POA: Diagnosis not present

## 2021-07-29 DIAGNOSIS — Z79899 Other long term (current) drug therapy: Secondary | ICD-10-CM | POA: Diagnosis not present

## 2021-07-29 LAB — COMPREHENSIVE METABOLIC PANEL
ALT: 56 U/L — ABNORMAL HIGH (ref 0–44)
AST: 69 U/L — ABNORMAL HIGH (ref 15–41)
Albumin: 3.7 g/dL (ref 3.5–5.0)
Alkaline Phosphatase: 101 U/L (ref 38–126)
Anion gap: 7 (ref 5–15)
BUN: 20 mg/dL (ref 8–23)
CO2: 23 mmol/L (ref 22–32)
Calcium: 8.3 mg/dL — ABNORMAL LOW (ref 8.9–10.3)
Chloride: 106 mmol/L (ref 98–111)
Creatinine, Ser: 1.3 mg/dL — ABNORMAL HIGH (ref 0.44–1.00)
GFR, Estimated: 45 mL/min — ABNORMAL LOW (ref >60)
Glucose, Bld: 152 mg/dL — ABNORMAL HIGH (ref 70–99)
Potassium: 3.8 mmol/L (ref 3.5–5.1)
Sodium: 136 mmol/L (ref 135–145)
Total Bilirubin: 0.3 mg/dL (ref 0.3–1.2)
Total Protein: 6.9 g/dL (ref 6.5–8.1)

## 2021-07-29 LAB — CBC WITH DIFFERENTIAL/PLATELET
Abs Immature Granulocytes: 0.05 10*3/uL (ref 0.00–0.07)
Basophils Absolute: 0.1 10*3/uL (ref 0.0–0.1)
Basophils Relative: 1 %
Eosinophils Absolute: 0.1 10*3/uL (ref 0.0–0.5)
Eosinophils Relative: 1 %
HCT: 31.4 % — ABNORMAL LOW (ref 36.0–46.0)
Hemoglobin: 9.2 g/dL — ABNORMAL LOW (ref 12.0–15.0)
Immature Granulocytes: 1 %
Lymphocytes Relative: 17 %
Lymphs Abs: 1.4 10*3/uL (ref 0.7–4.0)
MCH: 25.6 pg — ABNORMAL LOW (ref 26.0–34.0)
MCHC: 29.3 g/dL — ABNORMAL LOW (ref 30.0–36.0)
MCV: 87.2 fL (ref 80.0–100.0)
Monocytes Absolute: 0.5 10*3/uL (ref 0.1–1.0)
Monocytes Relative: 7 %
Neutro Abs: 6 10*3/uL (ref 1.7–7.7)
Neutrophils Relative %: 73 %
Platelets: 282 10*3/uL (ref 150–400)
RBC: 3.6 MIL/uL — ABNORMAL LOW (ref 3.87–5.11)
RDW: 19.4 % — ABNORMAL HIGH (ref 11.5–15.5)
WBC: 8.2 10*3/uL (ref 4.0–10.5)
nRBC: 0 % (ref 0.0–0.2)

## 2021-07-29 LAB — LACTATE DEHYDROGENASE: LDH: 140 U/L (ref 98–192)

## 2021-07-29 MED ORDER — SODIUM CHLORIDE 0.9 % IV SOLN
200.0000 mg | Freq: Once | INTRAVENOUS | Status: AC
  Administered 2021-07-29: 200 mg via INTRAVENOUS
  Filled 2021-07-29: qty 10

## 2021-07-29 MED ORDER — SODIUM CHLORIDE 0.9 % IV SOLN
Freq: Once | INTRAVENOUS | Status: AC
  Filled 2021-07-29: qty 250

## 2021-07-29 NOTE — Progress Notes (Signed)
[facility] CONSULT NOTE  Patient Care Team: Margaretann Loveless, MD as PCP - General (Internal Medicine) Earna Coder, MD as Consulting Physician (Oncology)  CHIEF COMPLAINTS/PURPOSE OF CONSULTATION: ANEMIA   HEMATOLOGY HISTORY  # JUNE 2023- ANEMIA[Hb; 8- MCV-platelets/WBC- WNL; Iron sat: 4%; ferritin: 13 [LL of N- 15] creat 1.1; CT/US- ;  EGD/colonoscopy-never  # A.fib on coumadin [Dr.Khan; Hx of Hematoma- JAN 2022- noted to have anemia at that time.  Hemoglobin around 7.  Admitted to hospital however did not have any blood transfusions on.  Patient was taken off Coumadin at the time for 3 months.  She is currently on Coumadin]  # active smoker;   HISTORY OF PRESENTING ILLNESS: Alone.  Ambulating independently.  Kristin Haas 66 y.o.  female pleasant is here today with results of the work-up for anemia.  Patient s/p IV iron infusion.  No infusion reactions noted.  Patient continues to complain of ongoing fatigue.  This patient is currently awaiting to get her CT scans ordered for unintentional weight loss.   Review of Systems  Constitutional:  Positive for malaise/fatigue and weight loss. Negative for chills, diaphoresis and fever.  HENT:  Negative for nosebleeds and sore throat.   Eyes:  Negative for double vision.  Respiratory:  Negative for cough, hemoptysis, sputum production, shortness of breath and wheezing.   Cardiovascular:  Negative for chest pain, palpitations, orthopnea and leg swelling.  Gastrointestinal:  Negative for abdominal pain, blood in stool, constipation, diarrhea, heartburn, melena, nausea and vomiting.  Genitourinary:  Negative for dysuria, frequency and urgency.  Musculoskeletal:  Negative for back pain and joint pain.  Skin: Negative.  Negative for itching and rash.  Neurological:  Positive for weakness. Negative for dizziness, tingling, focal weakness and headaches.  Endo/Heme/Allergies:  Does not bruise/bleed easily.   Psychiatric/Behavioral:  Negative for depression. The patient is not nervous/anxious and does not have insomnia.    MEDICAL HISTORY:  Past Medical History:  Diagnosis Date   Anxiety    Atrial fibrillation (HCC)    Depression    Hypercholesteremia    Hypertension     SURGICAL HISTORY: Past Surgical History:  Procedure Laterality Date   HERNIA REPAIR     TONSILLECTOMY     TUBAL LIGATION      SOCIAL HISTORY: Social History   Socioeconomic History   Marital status: Single    Spouse name: Not on file   Number of children: Not on file   Years of education: Not on file   Highest education level: Not on file  Occupational History   Not on file  Tobacco Use   Smoking status: Heavy Smoker    Packs/day: 0.50    Years: 50.00    Total pack years: 25.00    Types: Cigarettes   Smokeless tobacco: Never  Substance and Sexual Activity   Alcohol use: Yes    Comment: occasional/maybe once a year   Drug use: No   Sexual activity: Never  Other Topics Concern   Not on file  Social History Narrative   Patient used to be a Production designer, theatre/television/film in a warehouse.  Currently retired.  Active smoker.  No significant alcohol.   Social Determinants of Health   Financial Resource Strain: Not on file  Food Insecurity: Not on file  Transportation Needs: Not on file  Physical Activity: Not on file  Stress: Not on file  Social Connections: Not on file  Intimate Partner Violence: Not on file    FAMILY HISTORY:  Family History  Problem Relation Age of Onset   Hypertension Father    Lung cancer Father    Hypertension Sister    Uterine cancer Maternal Grandmother    Cervical cancer Paternal Grandmother    Lung cancer Son     ALLERGIES:  is allergic to codeine.  MEDICATIONS:  Current Outpatient Medications  Medication Sig Dispense Refill   acetaminophen (TYLENOL) 500 MG tablet Take 500 mg by mouth every 6 (six) hours as needed for mild pain.     amiodarone (PACERONE) 200 MG tablet Take 200 mg by  mouth daily.     busPIRone (BUSPAR) 5 MG tablet Take 5 mg by mouth 2 (two) times daily.     citalopram (CELEXA) 40 MG tablet Take 40 mg by mouth daily.     Melatonin 10 MG TABS Take 1 tablet by mouth at bedtime.     metoprolol succinate (TOPROL-XL) 25 MG 24 hr tablet Take 25 mg by mouth daily.     rosuvastatin (CRESTOR) 40 MG tablet Take 40 mg by mouth daily.     Vitamin D, Ergocalciferol, (DRISDOL) 1.25 MG (50000 UNIT) CAPS capsule Take 50,000 Units by mouth once a week.     warfarin (COUMADIN) 2 MG tablet Take 1 tablet (2 mg total) by mouth daily. Hold coumadin for now, repeat cbc by your pcp next week, pcp to decide when to resume coumadin     No current facility-administered medications for this visit.     Marland Kitchen  PHYSICAL EXAMINATION:   Vitals:   07/29/21 1451  BP: 133/74  Pulse: (!) 50  Resp: 18  Temp: 98.6 F (37 C)   Filed Weights   07/29/21 1451  Weight: 123 lb 6.4 oz (56 kg)    Physical Exam Vitals and nursing note reviewed.  HENT:     Head: Normocephalic and atraumatic.     Mouth/Throat:     Pharynx: Oropharynx is clear.  Eyes:     Extraocular Movements: Extraocular movements intact.     Pupils: Pupils are equal, round, and reactive to light.  Cardiovascular:     Rate and Rhythm: Normal rate and regular rhythm.  Pulmonary:     Comments: Decreased breath sounds bilaterally.  Abdominal:     Palpations: Abdomen is soft.  Musculoskeletal:        General: Normal range of motion.     Cervical back: Normal range of motion.  Skin:    General: Skin is warm.  Neurological:     General: No focal deficit present.     Mental Status: She is alert and oriented to person, place, and time.  Psychiatric:        Behavior: Behavior normal.        Judgment: Judgment normal.      LABORATORY DATA:  I have reviewed the data as listed Lab Results  Component Value Date   WBC 8.2 07/29/2021   HGB 9.2 (L) 07/29/2021   HCT 31.4 (L) 07/29/2021   MCV 87.2 07/29/2021   PLT  282 07/29/2021   Recent Labs    07/29/21 1419  NA 136  K 3.8  CL 106  CO2 23  GLUCOSE 152*  BUN 20  CREATININE 1.30*  CALCIUM 8.3*  GFRNONAA 45*  PROT 6.9  ALBUMIN 3.7  AST 69*  ALT 56*  ALKPHOS 101  BILITOT 0.3     No results found.  ASSESSMENT & PLAN:   Symptomatic anemia # JUNE 2023- [PCP; Hb; 8--platelets/WBC- WNL; Iron sat: 4%;  ferritin: 13 [LL of N- 15] secondary to to iron deficiency. s/p venofer x2- Hb- 9.2; proceed with IV venofer gentle iron.   #Etiology of iron deficiency: awaiting GI evaluation-EGD colonoscopy.  If negative consider capsule study.  CT scan pending- next week; 7/19.   # Abnormal weight loss: 15 pounds un itentional over > 6 month;Again unclear etiology in a smoker need to rule out malignancy. Awaiting CT scan chest and pelvis for further evaluation.  # A.fib/ hx of stroke on  Coumadin [Dr.Khan]  # Mild LFTs- ? statin vs others.  Monitor for now.  Will call with CT results  # DISPOSITION: # venofer today.  # follow up in  37months-MD; labs cbc/;cmp; iron studies ferritin possible venofer;-Dr.B  Cc; Dr. Welton Flakes    All questions were answered. The patient knows to call the clinic with any problems, questions or concerns.    Earna Coder, MD 08/03/2021 10:42 PM

## 2021-07-29 NOTE — Patient Instructions (Signed)
#  Recommend gentle iron 1 pill a day; should not upset your stomach or cause constipation.  Talk to the pharmacist if you can find it/it is over-the-counter.  

## 2021-07-29 NOTE — Assessment & Plan Note (Addendum)
#   JUNE 2023- [PCP; Hb; 8--platelets/WBC- WNL; Iron sat: 4%; ferritin: 13 [LL of N- 15] secondary to to iron deficiency.s/p venofer x2- Hb- 9.2; proceed with IV venofer geetle iron.   #Etiology of iron deficiency: awaiting GI evaluation-EGD colonoscopy.  If negative consider capsule study.  CT scan pending- next week; 7/19.   # Abnormal weight loss: 15 pounds un itentional over > 6 month;Again unclear etiology in a smoker need to rule out malignancy. Awaiting CT scan chest and pelvis for further evaluation.  # A.fib/ hx of stroke on  Coumadin [Dr.Khan]  # Mild LFTs- ? sttain vs others.   Will call with CT results  # DISPOSITION: # venofer today.  # follow up in  1months-MD; labs cbc/;cmp; iron studies ferritin possible venofer;-Dr.B  Cc; Dr. Welton Flakes

## 2021-07-29 NOTE — Patient Instructions (Signed)
[facility]  Discharge Instructions: Thank you for choosing [facility] to provide your oncology and hematology care.  If you have a lab appointment with the Cancer Center, please go directly to the Cancer Center and check in at the registration area.  Wear comfortable clothing and clothing appropriate for easy access to any Portacath or PICC line.   We strive to give you quality time with your provider. You may need to reschedule your appointment if you arrive late (15 or more minutes).  Arriving late affects you and other patients whose appointments are after yours.  Also, if you miss three or more appointments without notifying the office, you may be dismissed from the clinic at the provider's discretion.      For prescription refill requests, have your pharmacy contact our office and allow 72 hours for refills to be completed.    Today you received the following chemotherapy and/or immunotherapy agents VENOFER      To help prevent nausea and vomiting after your treatment, we encourage you to take your nausea medication as directed.  BELOW ARE SYMPTOMS THAT SHOULD BE REPORTED IMMEDIATELY: *FEVER GREATER THAN 100.4 F (38 C) OR HIGHER *CHILLS OR SWEATING *NAUSEA AND VOMITING THAT IS NOT CONTROLLED WITH YOUR NAUSEA MEDICATION *UNUSUAL SHORTNESS OF BREATH *UNUSUAL BRUISING OR BLEEDING *URINARY PROBLEMS (pain or burning when urinating, or frequent urination) *BOWEL PROBLEMS (unusual diarrhea, constipation, pain near the anus) TENDERNESS IN MOUTH AND THROAT WITH OR WITHOUT PRESENCE OF ULCERS (sore throat, sores in mouth, or a toothache) UNUSUAL RASH, SWELLING OR PAIN  UNUSUAL VAGINAL DISCHARGE OR ITCHING   Items with * indicate a potential emergency and should be followed up as soon as possible or go to the Emergency Department if any problems should occur.  Please show the CHEMOTHERAPY ALERT CARD or IMMUNOTHERAPY ALERT CARD at check-in to the  Emergency Department and triage nurse.  Should you have questions after your visit or need to cancel or reschedule your appointment, please contact [facility]  [phone] and follow the prompts.  Office hours are 8:00 a.m. to 4:30 p.m. Monday - Friday. Please note that voicemails left after 4:00 p.m. may not be returned until the following business day.  We are closed weekends and major holidays. You have access to a nurse at all times for urgent questions. Please call the main number to the clinic [phone] and follow the prompts.  For any non-urgent questions, you may also contact your provider using MyChart. We now offer e-Visits for anyone 18 and older to request care online for non-urgent symptoms. For details visit [email or web address].   Also download the MyChart app! Go to the app store, search "MyChart", open the app, select [facility], and log in with your MyChart username and password.  Masks are optional in the cancer centers. If you would like for your care team to wear a mask while they are taking care of you, please let them know. For doctor visits, patients may have with them one support person who is at least 67 years old. At this time, visitors are not allowed in the infusion area.   Iron Sucrose Injection What is this medication? IRON SUCROSE (EYE ern SOO krose) treats low levels of iron (iron deficiency anemia) in people with kidney disease. Iron is a mineral that plays an important role in making red blood cells, which carry oxygen from your lungs to the rest of your body. This medicine may   be used for other purposes; ask your health care provider or pharmacist if you have questions. COMMON BRAND NAME(S): Venofer What should I tell my care team before I take this medication? They need to know if you have any of these conditions: Anemia not caused by low iron levels Heart disease High levels of iron in the blood Kidney disease Liver  disease An unusual or allergic reaction to iron, other medications, foods, dyes, or preservatives Pregnant or trying to get pregnant Breast-feeding How should I use this medication? This medication is for infusion into a vein. It is given in a hospital or clinic setting. Talk to your care team about the use of this medication in children. While this medication may be prescribed for children as young as 2 years for selected conditions, precautions do apply. Overdosage: If you think you have taken too much of this medicine contact a poison control center or emergency room at once. NOTE: This medicine is only for you. Do not share this medicine with others. What if I miss a dose? It is important not to miss your dose. Call your care team if you are unable to keep an appointment. What may interact with this medication? Do not take this medication with any of the following: Deferoxamine Dimercaprol Other iron products This medication may also interact with the following: Chloramphenicol Deferasirox This list may not describe all possible interactions. Give your health care provider a list of all the medicines, herbs, non-prescription drugs, or dietary supplements you use. Also tell them if you smoke, drink alcohol, or use illegal drugs. Some items may interact with your medicine. What should I watch for while using this medication? Visit your care team regularly. Tell your care team if your symptoms do not start to get better or if they get worse. You may need blood work done while you are taking this medication. You may need to follow a special diet. Talk to your care team. Foods that contain iron include: whole grains/cereals, dried fruits, beans, or peas, leafy green vegetables, and organ meats (liver, kidney). What side effects may I notice from receiving this medication? Side effects that you should report to your care team as soon as possible: Allergic reactions--skin rash, itching, hives,  swelling of the face, lips, tongue, or throat Low blood pressure--dizziness, feeling faint or lightheaded, blurry vision Shortness of breath Side effects that usually do not require medical attention (report to your care team if they continue or are bothersome): Flushing Headache Joint pain Muscle pain Nausea Pain, redness, or irritation at injection site This list may not describe all possible side effects. Call your doctor for medical advice about side effects. You may report side effects to FDA at 1-800-FDA-1088. Where should I keep my medication? This medication is given in a hospital or clinic and will not be stored at home. NOTE: This sheet is a summary. It may not cover all possible information. If you have questions about this medicine, talk to your doctor, pharmacist, or health care provider.  2023 Elsevier/Gold Standard (2020-05-28 00:00:00)   

## 2021-08-01 DIAGNOSIS — I340 Nonrheumatic mitral (valve) insufficiency: Secondary | ICD-10-CM | POA: Diagnosis not present

## 2021-08-01 DIAGNOSIS — I2510 Atherosclerotic heart disease of native coronary artery without angina pectoris: Secondary | ICD-10-CM | POA: Diagnosis not present

## 2021-08-01 DIAGNOSIS — G4730 Sleep apnea, unspecified: Secondary | ICD-10-CM | POA: Diagnosis not present

## 2021-08-01 DIAGNOSIS — I10 Essential (primary) hypertension: Secondary | ICD-10-CM | POA: Diagnosis not present

## 2021-08-01 DIAGNOSIS — F17200 Nicotine dependence, unspecified, uncomplicated: Secondary | ICD-10-CM | POA: Diagnosis not present

## 2021-08-01 DIAGNOSIS — E785 Hyperlipidemia, unspecified: Secondary | ICD-10-CM | POA: Diagnosis not present

## 2021-08-01 DIAGNOSIS — I4891 Unspecified atrial fibrillation: Secondary | ICD-10-CM | POA: Diagnosis not present

## 2021-08-03 ENCOUNTER — Encounter: Payer: Self-pay | Admitting: Internal Medicine

## 2021-08-03 ENCOUNTER — Ambulatory Visit
Admission: RE | Admit: 2021-08-03 | Discharge: 2021-08-03 | Disposition: A | Discharge status: Home / Self Care | Payer: Medicare PPO | Source: Ambulatory Visit | Attending: Internal Medicine | Admitting: Internal Medicine

## 2021-08-03 DIAGNOSIS — R634 Abnormal weight loss: Secondary | ICD-10-CM | POA: Insufficient documentation

## 2021-08-03 DIAGNOSIS — N859 Noninflammatory disorder of uterus, unspecified: Secondary | ICD-10-CM | POA: Diagnosis not present

## 2021-08-03 DIAGNOSIS — I2510 Atherosclerotic heart disease of native coronary artery without angina pectoris: Secondary | ICD-10-CM | POA: Diagnosis not present

## 2021-08-03 DIAGNOSIS — J432 Centrilobular emphysema: Secondary | ICD-10-CM | POA: Diagnosis not present

## 2021-08-03 DIAGNOSIS — R918 Other nonspecific abnormal finding of lung field: Secondary | ICD-10-CM | POA: Diagnosis not present

## 2021-08-03 DIAGNOSIS — D649 Anemia, unspecified: Secondary | ICD-10-CM | POA: Insufficient documentation

## 2021-08-03 DIAGNOSIS — I700 Atherosclerosis of aorta: Secondary | ICD-10-CM | POA: Insufficient documentation

## 2021-08-03 MED ORDER — IOHEXOL 300 MG/ML  SOLN
80.0000 mL | Freq: Once | INTRAMUSCULAR | Status: AC | PRN
  Administered 2021-08-03: 80 mL via INTRAVENOUS

## 2021-08-04 ENCOUNTER — Encounter: Payer: Self-pay | Admitting: Internal Medicine

## 2021-08-04 NOTE — Progress Notes (Signed)
I tried to reach the patient to discuss the results of the CT scan.  Left a voicemail- will send her my chart message.

## 2021-08-22 IMAGING — CT CT ABD-PELV W/ CM
2 of 4 series · 14 of 46 positions shown, 16 images · IV contrast (Omni 300)
Comparison: None.
COMPARISON: None.

Addendum:
CLINICAL DATA: Patient fell and hit side of ribs. Presenting with
pain radiating downwwards. Bruise. Abdominal trauma.

EXAM:
CT ABDOMEN AND PELVIS WITH CONTRAST
TECHNIQUE: Multidetector CT imaging of the abdomen and pelvis was performed
using the standard protocol following bolus administration of
intravenous contrast.
CONTRAST:  100mL OMNIPAQUE IOHEXOL 300 MG/ML  SOLN

[Series 3: a/p w/ 5mm · axial · 0.80mm/px · z∈[+728,+1078]mm · 11 of 84 slices shown, 13 images]
[im 7/84  soft-tissue]
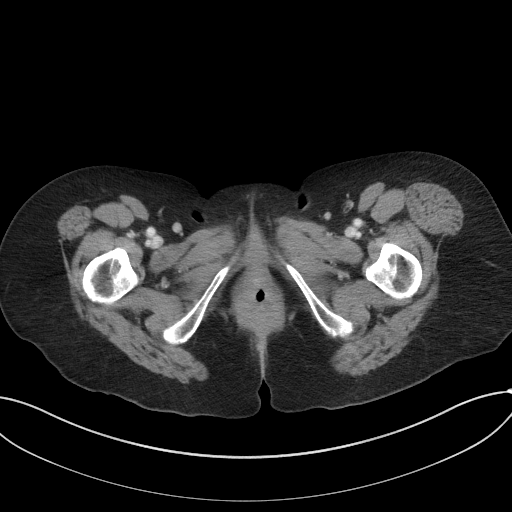
[im 7/84  bone]
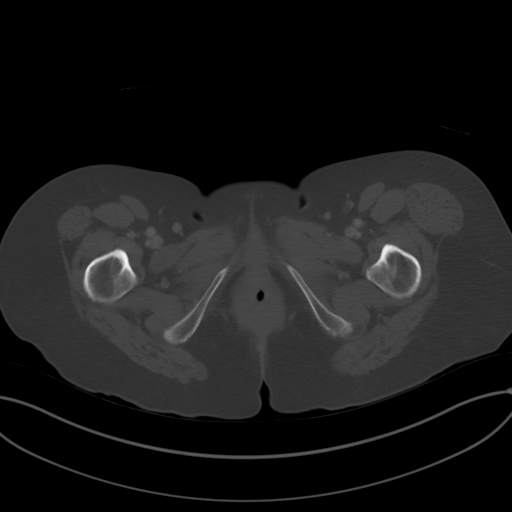
[im 14/84  soft-tissue]
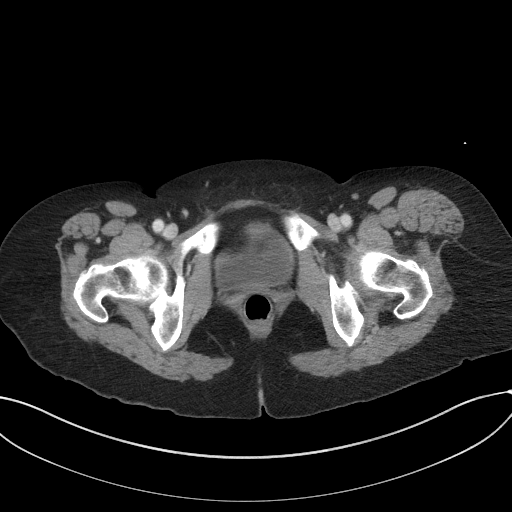
[im 20/84  soft-tissue]
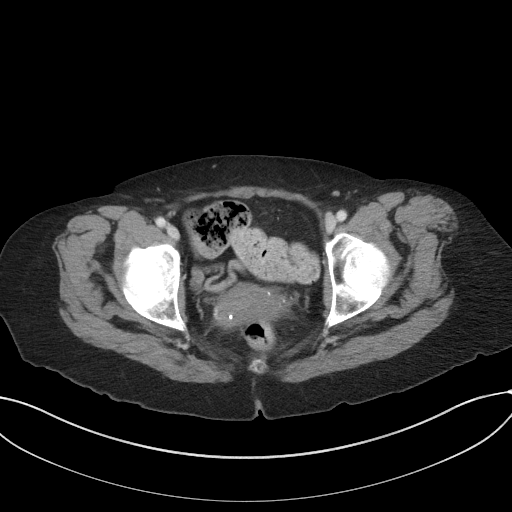
[im 27/84  soft-tissue]
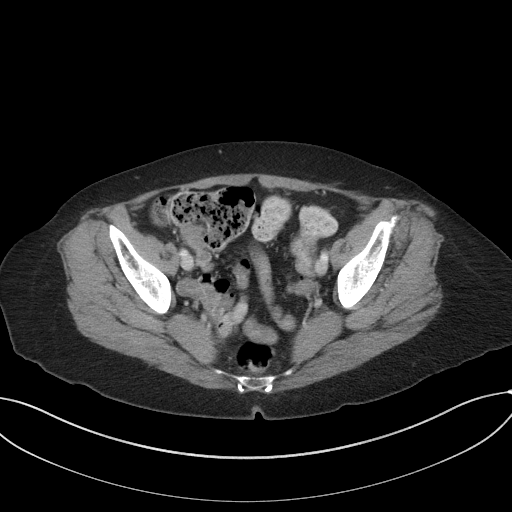
[im 34/84  soft-tissue]
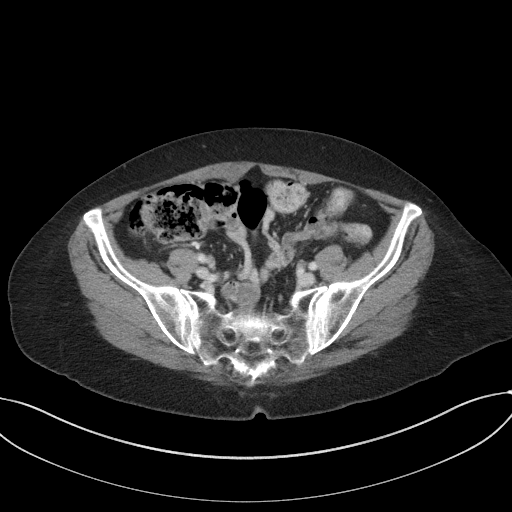
[im 44/84  soft-tissue]
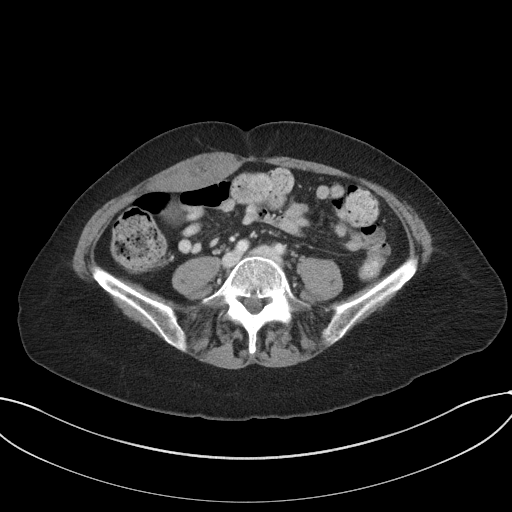
[im 50/84  soft-tissue]
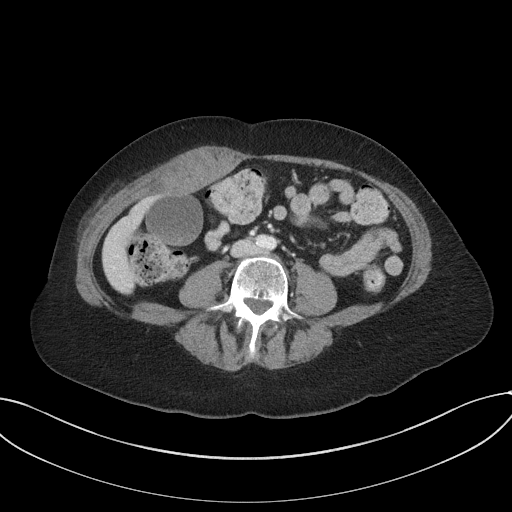
[im 57/84  soft-tissue]
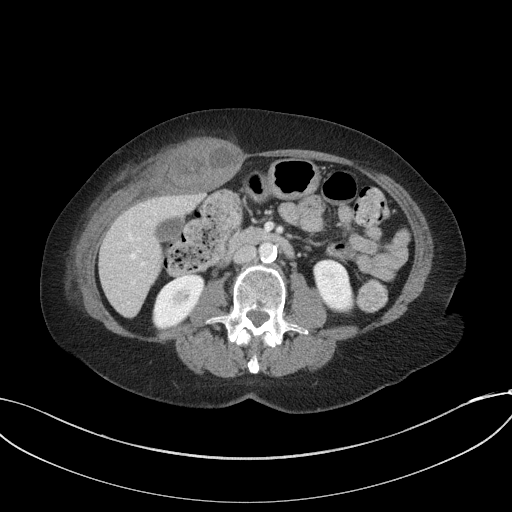
[im 64/84  soft-tissue]
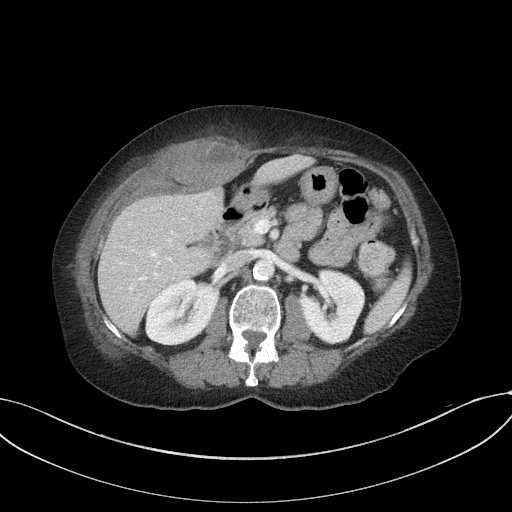
[im 64/84  bone]
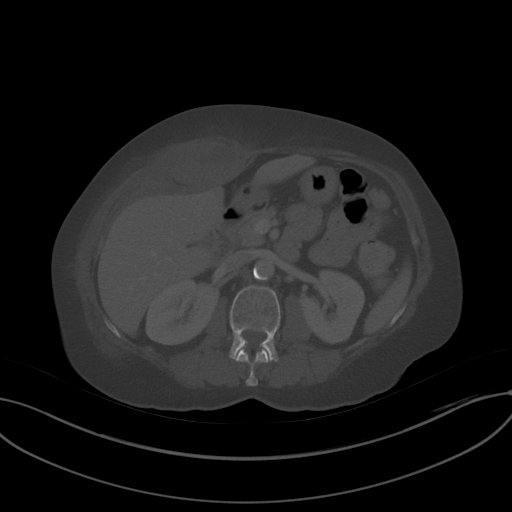
[im 70/84  soft-tissue]
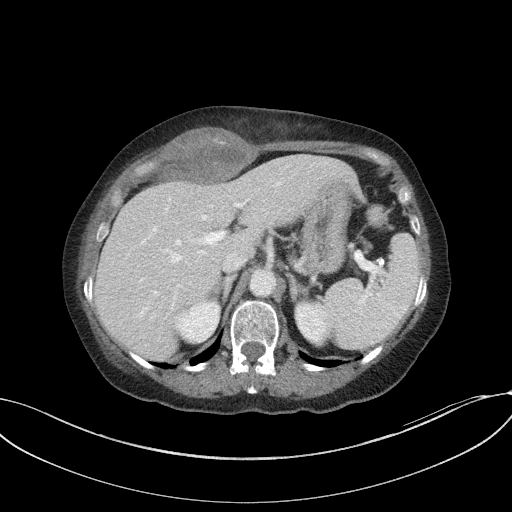
[im 77/84  soft-tissue]
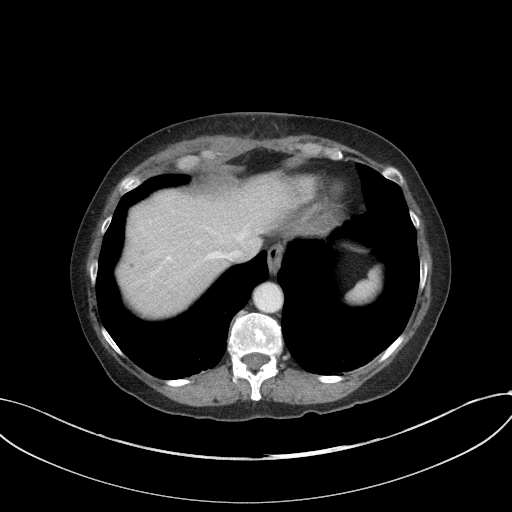

[Series 6: a/p w/ cor · coronal · 0.81mm/px · 3 of 118 slices shown]
[im 40/118  soft-tissue]
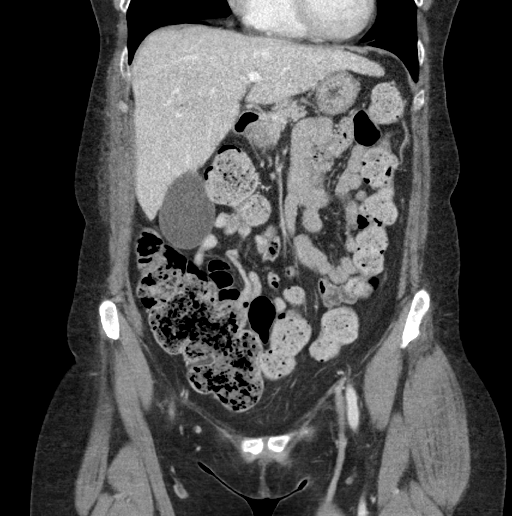
[im 53/118  soft-tissue]
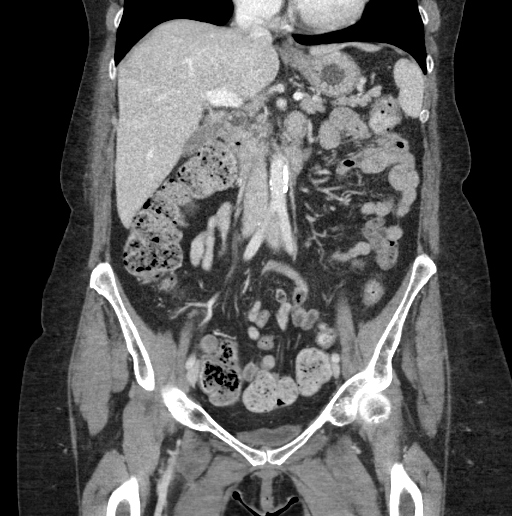
[im 66/118  soft-tissue]
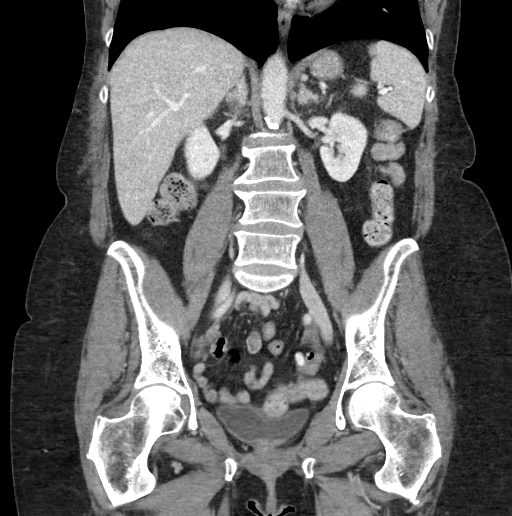

[14 of 46 positions shown; findings below may reference images not displayed]

FINDINGS: Lower chest: No acute abnormality.

Hepatobiliary: A 1.5 cm fluid density lesion within the right
hepatic lobe. Subcentimeter hypodensities are too small to
characterize. No gallstones, gallbladder wall thickening, or
pericholecystic fluid. No biliary dilatation.

Pancreas: No focal lesion. Normal pancreatic contour. No surrounding
inflammatory changes. No main pancreatic ductal dilatation.

Spleen: Normal in size without focal abnormality.

Adrenals/Urinary Tract: No adrenal nodule bilaterally. Bilateral
kidneys enhance symmetrically. No hydronephrosis. No hydroureter.
The urinary bladder is unremarkable.

Stomach/Bowel: Stomach is within normal limits. No evidence of bowel
wall thickening or dilatation. Few scattered sigmoid diverticula.
Stool throughout the colon. The appendix not definitely identified.

Vascular/Lymphatic: No abdominal aorta or iliac aneurysm. Mild
atherosclerotic plaque of the aorta and its branches. No abdominal,
pelvic, or inguinal lymphadenopathy.

Reproductive: There is a 1.6 cm hyperdensity within the uterus
likely within the endometrial canal. Otherwise the uterus and
bilateral adnexa are unremarkable.

Other: No intraperitoneal free fluid. No intraperitoneal free gas.
No organized fluid collection.

Musculoskeletal:

Approximately 4 x 7 x 12 cm right rectus sheath hematoma. Active
extravasation within the hematoma noted on the delayed imaging
([DATE]). No suspicious lytic or blastic osseous lesions. No acute
displaced fracture. Multilevel mild degenerative changes of the
spine.
IMPRESSION: 1. A 4 x 7 x 12 cm right rectus sheath hematoma with active
intralesional bleeding.
2. A 1.6 cm hyperdensity within the uterus likely within the
endometrial canal. Recommend correlation with prior cross-sectional
imaging and/or pelvic ultrasound. If not available, considering
patient is postmenopausal, recommend pelvic ultrasound for further
evaluation.
3. Scattered sigmoid diverticulosis with no acute diverticulitis.

ADDENDUM:
These results were called by telephone at the time of interpretation
on 02/04/2020 at [DATE] to provider AMBIORIX PETTIT , who verbally
acknowledged these results.

*** End of Addendum ***
FINDINGS: Lower chest: No acute abnormality.

Hepatobiliary: A 1.5 cm fluid density lesion within the right
hepatic lobe. Subcentimeter hypodensities are too small to
characterize. No gallstones, gallbladder wall thickening, or
pericholecystic fluid. No biliary dilatation.

Pancreas: No focal lesion. Normal pancreatic contour. No surrounding
inflammatory changes. No main pancreatic ductal dilatation.

Spleen: Normal in size without focal abnormality.

Adrenals/Urinary Tract: No adrenal nodule bilaterally. Bilateral
kidneys enhance symmetrically. No hydronephrosis. No hydroureter.
The urinary bladder is unremarkable.

Stomach/Bowel: Stomach is within normal limits. No evidence of bowel
wall thickening or dilatation. Few scattered sigmoid diverticula.
Stool throughout the colon. The appendix not definitely identified.

Vascular/Lymphatic: No abdominal aorta or iliac aneurysm. Mild
atherosclerotic plaque of the aorta and its branches. No abdominal,
pelvic, or inguinal lymphadenopathy.

Reproductive: There is a 1.6 cm hyperdensity within the uterus
likely within the endometrial canal. Otherwise the uterus and
bilateral adnexa are unremarkable.

Other: No intraperitoneal free fluid. No intraperitoneal free gas.
No organized fluid collection.

Musculoskeletal:

Approximately 4 x 7 x 12 cm right rectus sheath hematoma. Active
extravasation within the hematoma noted on the delayed imaging
([DATE]). No suspicious lytic or blastic osseous lesions. No acute
displaced fracture. Multilevel mild degenerative changes of the
spine.
IMPRESSION: 1. A 4 x 7 x 12 cm right rectus sheath hematoma with active
intralesional bleeding.
2. A 1.6 cm hyperdensity within the uterus likely within the
endometrial canal. Recommend correlation with prior cross-sectional
imaging and/or pelvic ultrasound. If not available, considering
patient is postmenopausal, recommend pelvic ultrasound for further
evaluation.
3. Scattered sigmoid diverticulosis with no acute diverticulitis.

## 2021-08-22 IMAGING — CR DG RIBS W/ CHEST 3+V*R*
3 series · 3 of 3 positions shown · non-contrast
Comparison: None.

CLINICAL DATA: Chest injury.  Right anterior rib pain.

EXAM:
RIGHT RIBS AND CHEST - 3+ VIEW

[chest pa]
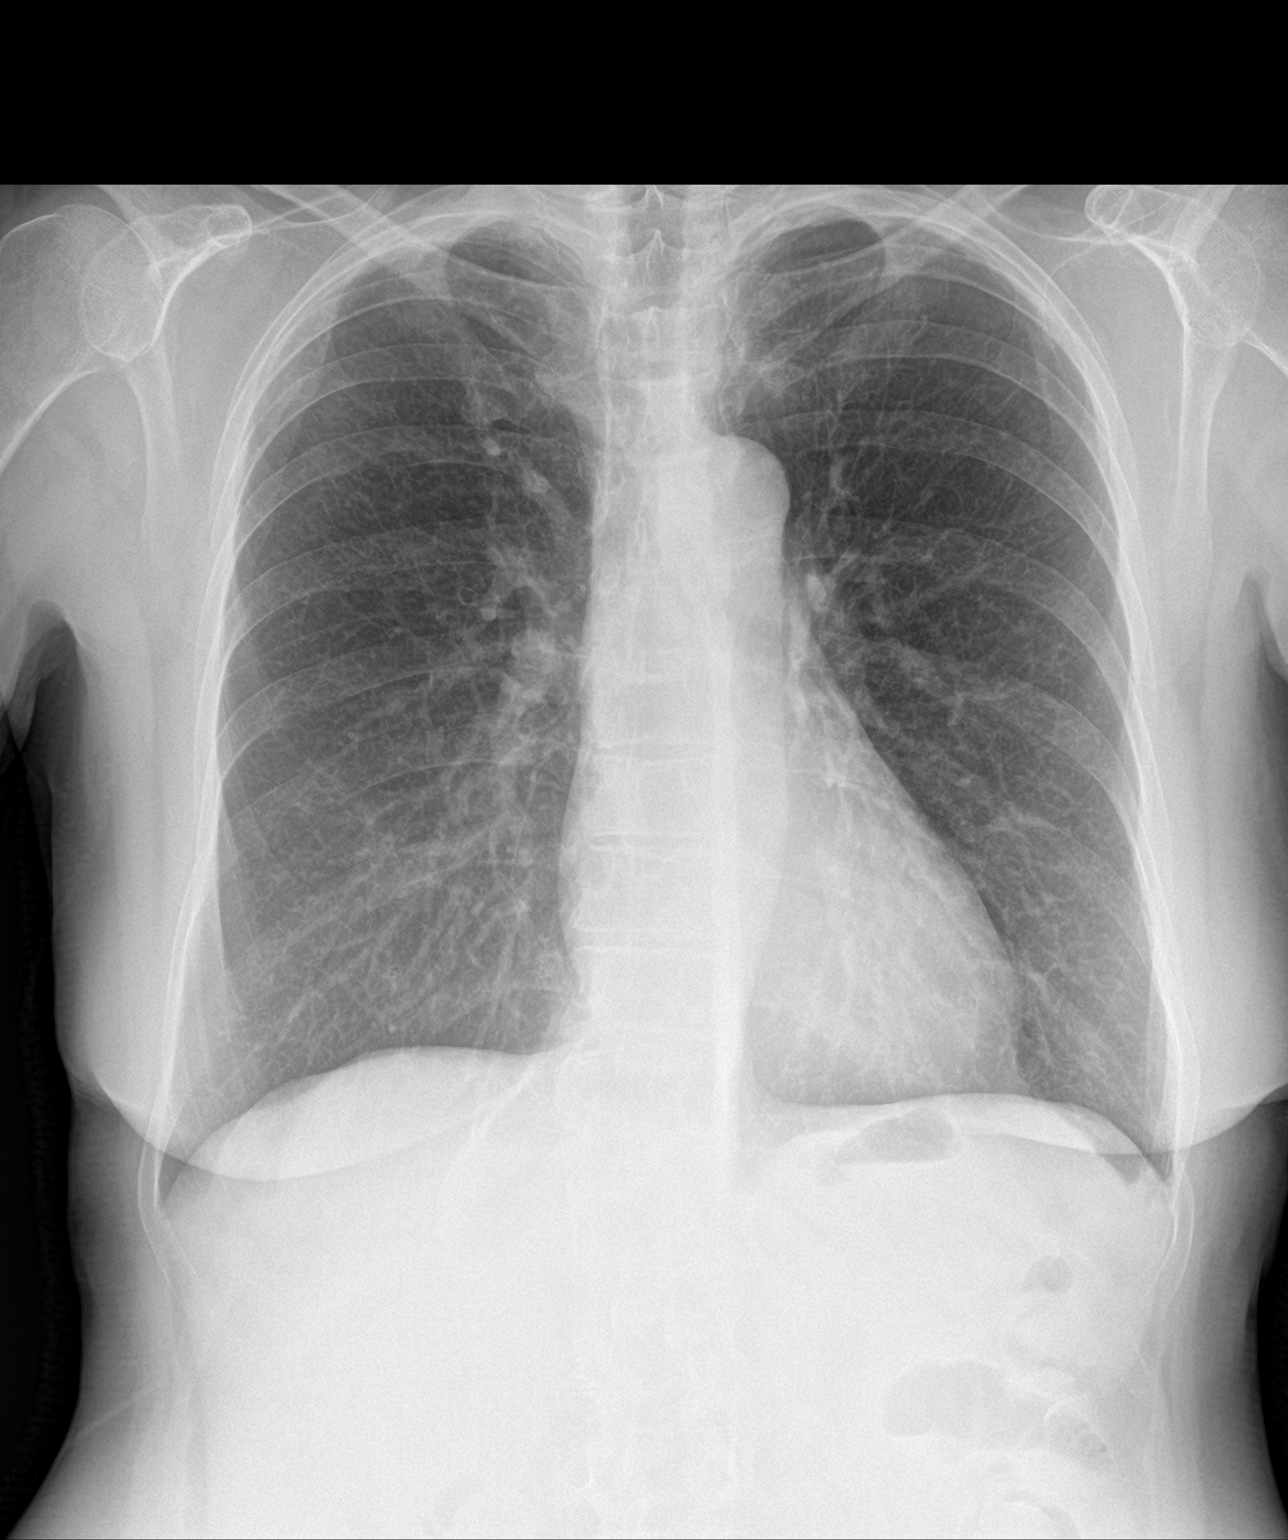

[rib pa]
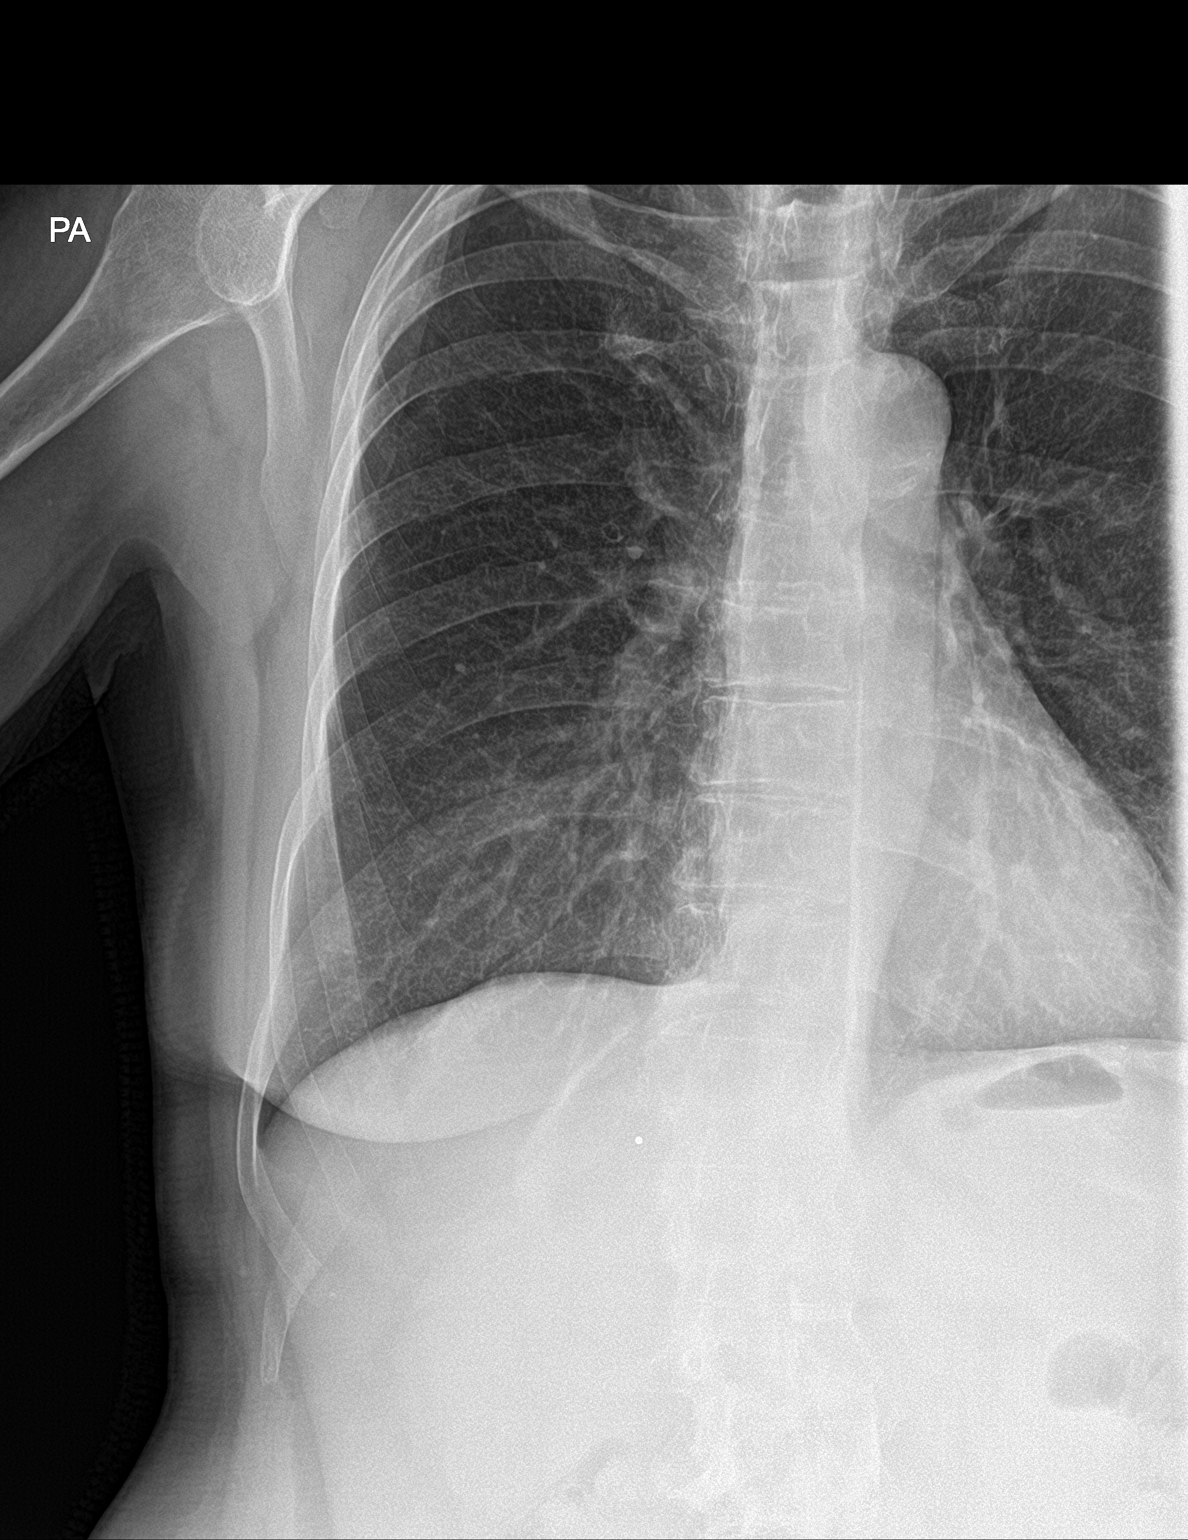

[rib pa obl]
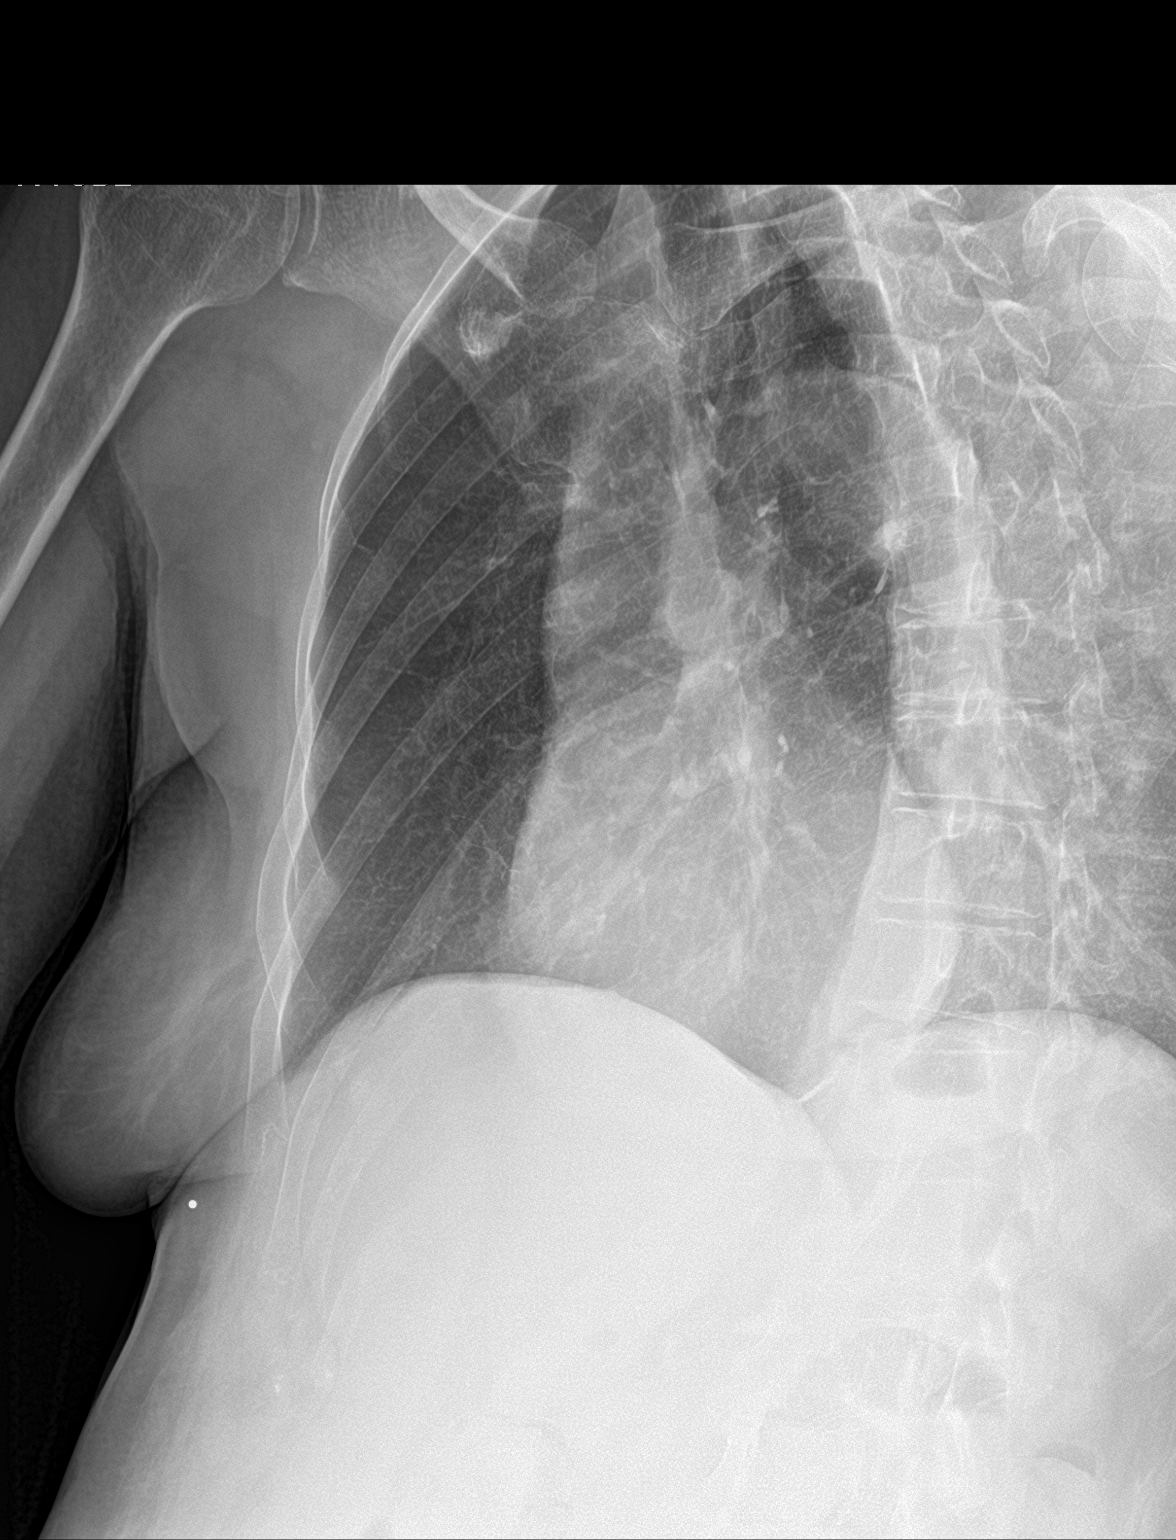

[3 of 3 positions shown; findings below may reference images not displayed]

FINDINGS: No fracture or other bone lesions are seen involving the ribs. There
is no evidence of pneumothorax or pleural effusion. Both lungs are
clear. Heart size and mediastinal contours are within normal limits.
IMPRESSION: Negative.

## 2021-08-30 DIAGNOSIS — I2510 Atherosclerotic heart disease of native coronary artery without angina pectoris: Secondary | ICD-10-CM | POA: Diagnosis not present

## 2021-10-04 DIAGNOSIS — Z6820 Body mass index (BMI) 20.0-20.9, adult: Secondary | ICD-10-CM | POA: Diagnosis not present

## 2021-10-04 DIAGNOSIS — F411 Generalized anxiety disorder: Secondary | ICD-10-CM | POA: Diagnosis not present

## 2021-10-04 DIAGNOSIS — Z1231 Encounter for screening mammogram for malignant neoplasm of breast: Secondary | ICD-10-CM | POA: Diagnosis not present

## 2021-10-04 DIAGNOSIS — G4733 Obstructive sleep apnea (adult) (pediatric): Secondary | ICD-10-CM | POA: Diagnosis not present

## 2021-10-04 DIAGNOSIS — Z0000 Encounter for general adult medical examination without abnormal findings: Secondary | ICD-10-CM | POA: Diagnosis not present

## 2021-10-04 DIAGNOSIS — J449 Chronic obstructive pulmonary disease, unspecified: Secondary | ICD-10-CM | POA: Diagnosis not present

## 2021-10-04 DIAGNOSIS — F32A Depression, unspecified: Secondary | ICD-10-CM | POA: Diagnosis not present

## 2021-10-04 DIAGNOSIS — E785 Hyperlipidemia, unspecified: Secondary | ICD-10-CM | POA: Diagnosis not present

## 2021-10-10 ENCOUNTER — Other Ambulatory Visit: Payer: Self-pay | Admitting: Family

## 2021-10-10 ENCOUNTER — Other Ambulatory Visit: Payer: Self-pay | Admitting: Nurse Practitioner

## 2021-10-10 DIAGNOSIS — Z1231 Encounter for screening mammogram for malignant neoplasm of breast: Secondary | ICD-10-CM

## 2021-10-28 ENCOUNTER — Ambulatory Visit: Payer: Medicare PPO

## 2021-10-28 ENCOUNTER — Other Ambulatory Visit: Payer: Medicare PPO

## 2021-10-28 ENCOUNTER — Ambulatory Visit: Payer: Medicare PPO | Admitting: Internal Medicine

## 2021-11-25 ENCOUNTER — Ambulatory Visit: Payer: Medicare PPO | Admitting: Internal Medicine

## 2021-11-25 ENCOUNTER — Ambulatory Visit: Payer: Medicare PPO

## 2021-11-25 ENCOUNTER — Other Ambulatory Visit: Payer: Medicare PPO

## 2021-12-02 DIAGNOSIS — G4730 Sleep apnea, unspecified: Secondary | ICD-10-CM | POA: Diagnosis not present

## 2021-12-02 DIAGNOSIS — F17200 Nicotine dependence, unspecified, uncomplicated: Secondary | ICD-10-CM | POA: Diagnosis not present

## 2021-12-02 DIAGNOSIS — I10 Essential (primary) hypertension: Secondary | ICD-10-CM | POA: Diagnosis not present

## 2021-12-02 DIAGNOSIS — E785 Hyperlipidemia, unspecified: Secondary | ICD-10-CM | POA: Diagnosis not present

## 2021-12-02 DIAGNOSIS — I4891 Unspecified atrial fibrillation: Secondary | ICD-10-CM | POA: Diagnosis not present

## 2021-12-02 DIAGNOSIS — I2510 Atherosclerotic heart disease of native coronary artery without angina pectoris: Secondary | ICD-10-CM | POA: Diagnosis not present

## 2021-12-02 DIAGNOSIS — I340 Nonrheumatic mitral (valve) insufficiency: Secondary | ICD-10-CM | POA: Diagnosis not present

## 2021-12-02 DIAGNOSIS — I361 Nonrheumatic tricuspid (valve) insufficiency: Secondary | ICD-10-CM | POA: Diagnosis not present

## 2021-12-05 ENCOUNTER — Encounter: Payer: Self-pay | Admitting: Internal Medicine

## 2021-12-05 ENCOUNTER — Inpatient Hospital Stay (HOSPITAL_BASED_OUTPATIENT_CLINIC_OR_DEPARTMENT_OTHER): Payer: Medicare PPO | Admitting: Internal Medicine

## 2021-12-05 ENCOUNTER — Inpatient Hospital Stay: Payer: Medicare PPO

## 2021-12-05 ENCOUNTER — Inpatient Hospital Stay: Payer: Medicare PPO | Attending: Internal Medicine

## 2021-12-05 VITALS — BP 103/54 | HR 44 | Temp 98.8°F | Resp 19 | Wt 121.5 lb

## 2021-12-05 DIAGNOSIS — R918 Other nonspecific abnormal finding of lung field: Secondary | ICD-10-CM

## 2021-12-05 DIAGNOSIS — R7401 Elevation of levels of liver transaminase levels: Secondary | ICD-10-CM

## 2021-12-05 DIAGNOSIS — I4891 Unspecified atrial fibrillation: Secondary | ICD-10-CM | POA: Diagnosis not present

## 2021-12-05 DIAGNOSIS — I340 Nonrheumatic mitral (valve) insufficiency: Secondary | ICD-10-CM | POA: Diagnosis not present

## 2021-12-05 DIAGNOSIS — F17210 Nicotine dependence, cigarettes, uncomplicated: Secondary | ICD-10-CM | POA: Insufficient documentation

## 2021-12-05 DIAGNOSIS — R9389 Abnormal findings on diagnostic imaging of other specified body structures: Secondary | ICD-10-CM | POA: Diagnosis not present

## 2021-12-05 DIAGNOSIS — D509 Iron deficiency anemia, unspecified: Secondary | ICD-10-CM | POA: Diagnosis not present

## 2021-12-05 DIAGNOSIS — Z8049 Family history of malignant neoplasm of other genital organs: Secondary | ICD-10-CM | POA: Insufficient documentation

## 2021-12-05 DIAGNOSIS — E785 Hyperlipidemia, unspecified: Secondary | ICD-10-CM | POA: Diagnosis not present

## 2021-12-05 DIAGNOSIS — K7689 Other specified diseases of liver: Secondary | ICD-10-CM | POA: Insufficient documentation

## 2021-12-05 DIAGNOSIS — Z801 Family history of malignant neoplasm of trachea, bronchus and lung: Secondary | ICD-10-CM | POA: Insufficient documentation

## 2021-12-05 DIAGNOSIS — G4730 Sleep apnea, unspecified: Secondary | ICD-10-CM | POA: Diagnosis not present

## 2021-12-05 DIAGNOSIS — D649 Anemia, unspecified: Secondary | ICD-10-CM | POA: Diagnosis not present

## 2021-12-05 DIAGNOSIS — R634 Abnormal weight loss: Secondary | ICD-10-CM | POA: Diagnosis not present

## 2021-12-05 DIAGNOSIS — I361 Nonrheumatic tricuspid (valve) insufficiency: Secondary | ICD-10-CM | POA: Diagnosis not present

## 2021-12-05 DIAGNOSIS — F17200 Nicotine dependence, unspecified, uncomplicated: Secondary | ICD-10-CM | POA: Diagnosis not present

## 2021-12-05 DIAGNOSIS — Z7901 Long term (current) use of anticoagulants: Secondary | ICD-10-CM | POA: Diagnosis not present

## 2021-12-05 DIAGNOSIS — I10 Essential (primary) hypertension: Secondary | ICD-10-CM | POA: Insufficient documentation

## 2021-12-05 DIAGNOSIS — I2510 Atherosclerotic heart disease of native coronary artery without angina pectoris: Secondary | ICD-10-CM | POA: Diagnosis not present

## 2021-12-05 LAB — CBC WITH DIFFERENTIAL/PLATELET
Abs Immature Granulocytes: 0.03 10*3/uL (ref 0.00–0.07)
Basophils Absolute: 0.1 10*3/uL (ref 0.0–0.1)
Basophils Relative: 1 %
Eosinophils Absolute: 0.1 10*3/uL (ref 0.0–0.5)
Eosinophils Relative: 1 %
HCT: 39.5 % (ref 36.0–46.0)
Hemoglobin: 12.4 g/dL (ref 12.0–15.0)
Immature Granulocytes: 0 %
Lymphocytes Relative: 18 %
Lymphs Abs: 1.5 10*3/uL (ref 0.7–4.0)
MCH: 26.2 pg (ref 26.0–34.0)
MCHC: 31.4 g/dL (ref 30.0–36.0)
MCV: 83.5 fL (ref 80.0–100.0)
Monocytes Absolute: 0.5 10*3/uL (ref 0.1–1.0)
Monocytes Relative: 6 %
Neutro Abs: 6.2 10*3/uL (ref 1.7–7.7)
Neutrophils Relative %: 74 %
Platelets: 188 10*3/uL (ref 150–400)
RBC: 4.73 MIL/uL (ref 3.87–5.11)
RDW: 20.2 % — ABNORMAL HIGH (ref 11.5–15.5)
WBC: 8.4 10*3/uL (ref 4.0–10.5)
nRBC: 0 % (ref 0.0–0.2)

## 2021-12-05 LAB — COMPREHENSIVE METABOLIC PANEL
ALT: 100 U/L — ABNORMAL HIGH (ref 0–44)
AST: 88 U/L — ABNORMAL HIGH (ref 15–41)
Albumin: 3.8 g/dL (ref 3.5–5.0)
Alkaline Phosphatase: 111 U/L (ref 38–126)
Anion gap: 6 (ref 5–15)
BUN: 18 mg/dL (ref 8–23)
CO2: 25 mmol/L (ref 22–32)
Calcium: 8.9 mg/dL (ref 8.9–10.3)
Chloride: 103 mmol/L (ref 98–111)
Creatinine, Ser: 1.49 mg/dL — ABNORMAL HIGH (ref 0.44–1.00)
GFR, Estimated: 38 mL/min — ABNORMAL LOW (ref >60)
Glucose, Bld: 166 mg/dL — ABNORMAL HIGH (ref 70–99)
Potassium: 3.8 mmol/L (ref 3.5–5.1)
Sodium: 134 mmol/L — ABNORMAL LOW (ref 135–145)
Total Bilirubin: 0.3 mg/dL (ref 0.3–1.2)
Total Protein: 7.2 g/dL (ref 6.5–8.1)

## 2021-12-05 LAB — IRON AND TIBC
Iron: 206 ug/dL — ABNORMAL HIGH (ref 28–170)
Saturation Ratios: 48 % — ABNORMAL HIGH (ref 10.4–31.8)
TIBC: 431 ug/dL (ref 250–450)
UIBC: 225 ug/dL

## 2021-12-05 LAB — FERRITIN: Ferritin: 29 ng/mL (ref 11–307)

## 2021-12-06 ENCOUNTER — Encounter: Payer: Self-pay | Admitting: Internal Medicine

## 2021-12-06 DIAGNOSIS — R9389 Abnormal findings on diagnostic imaging of other specified body structures: Secondary | ICD-10-CM | POA: Insufficient documentation

## 2021-12-06 DIAGNOSIS — D509 Iron deficiency anemia, unspecified: Secondary | ICD-10-CM | POA: Insufficient documentation

## 2021-12-06 NOTE — Progress Notes (Signed)
[facility] CONSULT NOTE  Patient Care Team: Margaretann Loveless, MD as PCP - General (Internal Medicine) Earna Coder, MD as Consulting Physician (Oncology)  CHIEF COMPLAINTS/PURPOSE OF CONSULTATION: ANEMIA   HEMATOLOGY HISTORY  # JUNE 2023- ANEMIA[Hb; 8- MCV-platelets/WBC- WNL; Iron sat: 4%; ferritin: 13 [LL of N- 15] creat 1.1; CT/US- ;  EGD/colonoscopy-never  # A.fib on coumadin [Dr.Khan; Hx of Hematoma- JAN 2022- noted to have anemia at that time.  Hemoglobin around 7.  Admitted to hospital however did not have any blood transfusions on.  Patient was taken off Coumadin at the time for 3 months.  She is currently on Coumadin]  # active smoker;   HISTORY OF PRESENTING ILLNESS: Alone.  Ambulating independently.  Kristin Haas 66 y.o.  female pleasant is here today with results of the work-up for anemia.  Patient reports weight loss of about 3 pounds in past few months.  Has fatigue.  Denies any recent illnesses.    Review of Systems  Constitutional:  Positive for malaise/fatigue and weight loss. Negative for chills, diaphoresis and fever.  HENT:  Negative for nosebleeds and sore throat.   Eyes:  Negative for double vision.  Respiratory:  Negative for cough, hemoptysis, sputum production, shortness of breath and wheezing.   Cardiovascular:  Negative for chest pain, palpitations, orthopnea and leg swelling.  Gastrointestinal:  Negative for abdominal pain, blood in stool, constipation, diarrhea, heartburn, melena, nausea and vomiting.  Genitourinary:  Negative for dysuria, frequency and urgency.  Musculoskeletal:  Negative for back pain and joint pain.  Skin: Negative.  Negative for itching and rash.  Neurological:  Negative for dizziness, tingling, focal weakness and headaches.  Endo/Heme/Allergies:  Does not bruise/bleed easily.  Psychiatric/Behavioral:  Negative for depression. The patient is not nervous/anxious and does not have insomnia.    MEDICAL  HISTORY:  Past Medical History:  Diagnosis Date   Anxiety    Atrial fibrillation (HCC)    Depression    Hypercholesteremia    Hypertension     SURGICAL HISTORY: Past Surgical History:  Procedure Laterality Date   HERNIA REPAIR     TONSILLECTOMY     TUBAL LIGATION      SOCIAL HISTORY: Social History   Socioeconomic History   Marital status: Single    Spouse name: Not on file   Number of children: Not on file   Years of education: Not on file   Highest education level: Not on file  Occupational History   Not on file  Tobacco Use   Smoking status: Heavy Smoker    Packs/day: 0.50    Years: 50.00    Total pack years: 25.00    Types: Cigarettes   Smokeless tobacco: Never  Substance and Sexual Activity   Alcohol use: Yes    Comment: occasional/maybe once a year   Drug use: No   Sexual activity: Never  Other Topics Concern   Not on file  Social History Narrative   Patient used to be a Production designer, theatre/television/film in a warehouse.  Currently retired.  Active smoker.  No significant alcohol.   Social Determinants of Health   Financial Resource Strain: Not on file  Food Insecurity: Not on file  Transportation Needs: Not on file  Physical Activity: Not on file  Stress: Not on file  Social Connections: Not on file  Intimate Partner Violence: Not on file    FAMILY HISTORY: Family History  Problem Relation Age of Onset   Hypertension Father    Lung  cancer Father    Hypertension Sister    Uterine cancer Maternal Grandmother    Cervical cancer Paternal Grandmother    Lung cancer Son     ALLERGIES:  is allergic to codeine.  MEDICATIONS:  Current Outpatient Medications  Medication Sig Dispense Refill   acetaminophen (TYLENOL) 500 MG tablet Take 500 mg by mouth every 6 (six) hours as needed for mild pain.     amiodarone (PACERONE) 200 MG tablet Take 200 mg by mouth daily.     busPIRone (BUSPAR) 5 MG tablet Take 5 mg by mouth 2 (two) times daily.     citalopram (CELEXA) 40 MG tablet  Take 40 mg by mouth daily.     Melatonin 10 MG TABS Take 1 tablet by mouth at bedtime.     metoprolol succinate (TOPROL-XL) 25 MG 24 hr tablet Take 25 mg by mouth daily.     rosuvastatin (CRESTOR) 40 MG tablet Take 40 mg by mouth daily.     Vitamin D, Ergocalciferol, (DRISDOL) 1.25 MG (50000 UNIT) CAPS capsule Take 50,000 Units by mouth once a week.     warfarin (COUMADIN) 2 MG tablet Take 1 tablet (2 mg total) by mouth daily. Hold coumadin for now, repeat cbc by your pcp next week, pcp to decide when to resume coumadin     No current facility-administered medications for this visit.     Marland Kitchen  PHYSICAL EXAMINATION:   Vitals:   12/05/21 1326  BP: (!) 103/54  Pulse: (!) 44  Resp: 19  Temp: 98.8 F (37.1 C)  SpO2: 99%   Filed Weights   12/05/21 1326  Weight: 121 lb 8 oz (55.1 kg)    Physical Exam Vitals and nursing note reviewed.  HENT:     Head: Normocephalic and atraumatic.     Mouth/Throat:     Pharynx: Oropharynx is clear.  Eyes:     Extraocular Movements: Extraocular movements intact.     Pupils: Pupils are equal, round, and reactive to light.  Cardiovascular:     Rate and Rhythm: Normal rate and regular rhythm.  Pulmonary:     Comments: Decreased breath sounds bilaterally.  Abdominal:     Palpations: Abdomen is soft.  Musculoskeletal:        General: Normal range of motion.     Cervical back: Normal range of motion.  Skin:    General: Skin is warm.  Neurological:     General: No focal deficit present.     Mental Status: She is alert and oriented to person, place, and time.  Psychiatric:        Behavior: Behavior normal.        Judgment: Judgment normal.      LABORATORY DATA:  I have reviewed the data as listed Lab Results  Component Value Date   WBC 8.4 12/05/2021   HGB 12.4 12/05/2021   HCT 39.5 12/05/2021   MCV 83.5 12/05/2021   PLT 188 12/05/2021   Recent Labs    07/29/21 1419 12/05/21 1305  NA 136 134*  K 3.8 3.8  CL 106 103  CO2 23 25   GLUCOSE 152* 166*  BUN 20 18  CREATININE 1.30* 1.49*  CALCIUM 8.3* 8.9  GFRNONAA 45* 38*  PROT 6.9 7.2  ALBUMIN 3.7 3.8  AST 69* 88*  ALT 56* 100*  ALKPHOS 101 111  BILITOT 0.3 0.3      No results found.  ASSESSMENT & PLAN:  Symptomatic anemia # JUNE 2023- [PCP; Hb; 8--platelets/WBC- WNL;  Iron sat: 4%; ferritin: 13 [LL of N- 15] secondary to to iron deficiency.  Completed IV Venofer x 3 in July 2023.  Now on oral iron tolerating well.  Repeat iron studies showed normal iron stores.  Hold on IV Venofer.  GI service tried to reach patient multiple times but could not get hold of her.  Will place new GI referral for colonoscopy/EGD.   # Abnormal weight loss: Had CT chest abdomen pelvis on 08/03/2021. Occasional ground-glass opacities throughout the lungs, largest in the left upper lobe measuring 2.8 x 2.5 cm, additional opacity of the anterior right upper lobe measuring 1.0 x 0.5 cm.  Repeat was recommended in 6 months.  CT chest ordered to be done end of January 2024.  Patient is a smoker.   # Transaminitis: CT abdomen showed stable liver cysts.   # DISPOSITION: # No IV Venofer # CT chest end of January 2024 # Follow-up in 4 months with Dr. Jacinto Reap, labs   All questions were answered. The patient knows to call the clinic with any problems, questions or concerns.    Jane Canary, MD 12/06/2021 8:53 AM

## 2022-01-03 DIAGNOSIS — E559 Vitamin D deficiency, unspecified: Secondary | ICD-10-CM | POA: Diagnosis not present

## 2022-01-03 DIAGNOSIS — F411 Generalized anxiety disorder: Secondary | ICD-10-CM | POA: Diagnosis not present

## 2022-01-03 DIAGNOSIS — R7301 Impaired fasting glucose: Secondary | ICD-10-CM | POA: Diagnosis not present

## 2022-01-03 DIAGNOSIS — I2510 Atherosclerotic heart disease of native coronary artery without angina pectoris: Secondary | ICD-10-CM | POA: Diagnosis not present

## 2022-01-03 DIAGNOSIS — F32A Depression, unspecified: Secondary | ICD-10-CM | POA: Diagnosis not present

## 2022-01-03 DIAGNOSIS — R634 Abnormal weight loss: Secondary | ICD-10-CM | POA: Diagnosis not present

## 2022-01-03 DIAGNOSIS — D519 Vitamin B12 deficiency anemia, unspecified: Secondary | ICD-10-CM | POA: Diagnosis not present

## 2022-01-03 DIAGNOSIS — I10 Essential (primary) hypertension: Secondary | ICD-10-CM | POA: Diagnosis not present

## 2022-01-03 DIAGNOSIS — E785 Hyperlipidemia, unspecified: Secondary | ICD-10-CM | POA: Diagnosis not present

## 2022-01-13 DIAGNOSIS — R634 Abnormal weight loss: Secondary | ICD-10-CM | POA: Diagnosis not present

## 2022-01-13 DIAGNOSIS — I2510 Atherosclerotic heart disease of native coronary artery without angina pectoris: Secondary | ICD-10-CM | POA: Diagnosis not present

## 2022-01-20 DIAGNOSIS — R7301 Impaired fasting glucose: Secondary | ICD-10-CM | POA: Diagnosis not present

## 2022-02-10 ENCOUNTER — Ambulatory Visit
Admission: RE | Admit: 2022-02-10 | Discharge: 2022-02-10 | Disposition: A | Discharge status: Home / Self Care | Payer: Medicare PPO | Source: Ambulatory Visit | Attending: Internal Medicine | Admitting: Internal Medicine

## 2022-02-10 DIAGNOSIS — G4730 Sleep apnea, unspecified: Secondary | ICD-10-CM | POA: Diagnosis not present

## 2022-02-10 DIAGNOSIS — E785 Hyperlipidemia, unspecified: Secondary | ICD-10-CM | POA: Diagnosis not present

## 2022-02-10 DIAGNOSIS — I4891 Unspecified atrial fibrillation: Secondary | ICD-10-CM | POA: Diagnosis not present

## 2022-02-10 DIAGNOSIS — J439 Emphysema, unspecified: Secondary | ICD-10-CM | POA: Diagnosis not present

## 2022-02-10 DIAGNOSIS — I340 Nonrheumatic mitral (valve) insufficiency: Secondary | ICD-10-CM | POA: Diagnosis not present

## 2022-02-10 DIAGNOSIS — I10 Essential (primary) hypertension: Secondary | ICD-10-CM | POA: Diagnosis not present

## 2022-02-10 DIAGNOSIS — R918 Other nonspecific abnormal finding of lung field: Secondary | ICD-10-CM | POA: Diagnosis not present

## 2022-02-10 DIAGNOSIS — F17200 Nicotine dependence, unspecified, uncomplicated: Secondary | ICD-10-CM | POA: Diagnosis not present

## 2022-02-10 DIAGNOSIS — I2510 Atherosclerotic heart disease of native coronary artery without angina pectoris: Secondary | ICD-10-CM | POA: Diagnosis not present

## 2022-02-23 ENCOUNTER — Other Ambulatory Visit: Payer: Self-pay | Admitting: Family

## 2022-02-23 MED ORDER — CITALOPRAM HYDROBROMIDE 40 MG PO TABS
40.0000 mg | ORAL_TABLET | Freq: Every day | ORAL | 1 refills | Status: DC
Start: 2022-02-23 — End: 2022-08-21

## 2022-03-10 ENCOUNTER — Other Ambulatory Visit: Payer: Self-pay | Admitting: Cardiovascular Disease

## 2022-03-17 ENCOUNTER — Other Ambulatory Visit: Payer: Self-pay

## 2022-03-22 ENCOUNTER — Other Ambulatory Visit: Payer: Self-pay

## 2022-03-24 ENCOUNTER — Other Ambulatory Visit: Payer: Self-pay

## 2022-03-24 MED ORDER — WARFARIN SODIUM 2 MG PO TABS: 2.0000 mg | ORAL_TABLET | Freq: Every day | ORAL | 3 refills | Status: DC

## 2022-03-24 MED ORDER — LOSARTAN POTASSIUM 25 MG PO TABS: 25.0000 mg | ORAL_TABLET | Freq: Every day | ORAL | 11 refills | Status: DC

## 2022-04-04 ENCOUNTER — Other Ambulatory Visit: Payer: Self-pay | Admitting: Cardiovascular Disease

## 2022-04-04 ENCOUNTER — Encounter: Payer: Self-pay | Admitting: Internal Medicine

## 2022-04-04 ENCOUNTER — Encounter: Payer: Self-pay | Admitting: Family

## 2022-04-04 ENCOUNTER — Ambulatory Visit (INDEPENDENT_AMBULATORY_CARE_PROVIDER_SITE_OTHER): Payer: Medicare PPO | Admitting: Family

## 2022-04-04 VITALS — BP 120/68 | HR 52 | Ht 66.0 in | Wt 121.0 lb

## 2022-04-04 DIAGNOSIS — D509 Iron deficiency anemia, unspecified: Secondary | ICD-10-CM

## 2022-04-04 DIAGNOSIS — I482 Chronic atrial fibrillation, unspecified: Secondary | ICD-10-CM

## 2022-04-04 DIAGNOSIS — F3341 Major depressive disorder, recurrent, in partial remission: Secondary | ICD-10-CM | POA: Diagnosis not present

## 2022-04-04 DIAGNOSIS — F32A Depression, unspecified: Secondary | ICD-10-CM | POA: Insufficient documentation

## 2022-04-04 DIAGNOSIS — E782 Mixed hyperlipidemia: Secondary | ICD-10-CM

## 2022-04-04 DIAGNOSIS — I4891 Unspecified atrial fibrillation: Secondary | ICD-10-CM | POA: Diagnosis not present

## 2022-04-04 MED ORDER — ROSUVASTATIN CALCIUM 40 MG PO TABS: 40.0000 mg | ORAL_TABLET | Freq: Every day | ORAL | 3 refills | Status: DC

## 2022-04-04 NOTE — Progress Notes (Signed)
Established Patient Office Visit  Subjective:  Patient ID: Kristin Haas, female    DOB: 1955/10/19  Age: 67 y.o. MRN: [id number]  Chief Complaint  Patient presents with   Follow-up    3 month follow up    Patient is here today for her 3 months follow up.  She has been feeling fairly well since last appointment.   She does not have additional concerns to discuss today.  Labs are due today. She needs refills.   I have reviewed her active problem list, medication list, allergies, family history, notes from last encounter, lab results for her appointment today.    No other concerns at this time.   Past Medical History:  Diagnosis Date   Anxiety    Atrial fibrillation ([place])    Depression    Flushing 09/06/2015   Hypercholesteremia    Hypertension    Palpitation 09/06/2015   Rectus sheath hematoma, initial encounter 02/04/2020    Past Surgical History:  Procedure Laterality Date   HERNIA REPAIR     TONSILLECTOMY     TUBAL LIGATION      Social History   Socioeconomic History   Marital status: Single    Spouse name: Not on file   Number of children: Not on file   Years of education: Not on file   Highest education level: Not on file  Occupational History   Not on file  Tobacco Use   Smoking status: Every Day    Packs/day: 0.50    Years: 50.00    Additional pack years: 0.00    Total pack years: 25.00    Types: Cigarettes   Smokeless tobacco: Never   Tobacco comments:    Patient down to 2 packs/week, which is approximately 1/2 of her previous intake.   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  Active smoker.  No significant alcohol.   Social Determinants of Health   Financial Resource Strain: Not on file  Food Insecurity: Not on file   Transportation Needs: Not on file  Physical Activity: Not on file  Stress: Not on file  Social Connections: Not on file  Intimate Partner Violence: Not on file    Family History  Problem Relation Age of Onset   Hypertension Father    Lung cancer Father    Hypertension Sister    Uterine cancer Maternal Grandmother    Cervical cancer Paternal Grandmother    Lung cancer Son     Allergies  Allergen Reactions   Codeine Nausea And Vomiting    Review of Systems  Psychiatric/Behavioral:  The patient has insomnia.   All other systems reviewed and are negative.      Objective:   BP 120/68   Pulse (!) 52   Ht 5\' 6"  (1.676 m)   Wt 121 lb (54.9 kg)   SpO2 99%   BMI 19.53 kg/m   Vitals:   04/04/22 0915  BP: 120/68  Pulse: (!) 52  Height: 5\' 6"  (1.676 m)  Weight: 121 lb (54.9 kg)  SpO2: 99%  BMI (Calculated): 19.54    Physical Exam Vitals and nursing note reviewed.  Constitutional:      Appearance: Normal appearance. She is normal weight.  HENT:  Head: Normocephalic.  Eyes:     Extraocular Movements: Extraocular movements intact.     Conjunctiva/sclera: Conjunctivae normal.     Pupils: Pupils are equal, round, and reactive to light.  Cardiovascular:     Rate and Rhythm: Normal rate.     Pulses: Normal pulses.  Pulmonary:     Effort: Pulmonary effort is normal.  Musculoskeletal:        General: Normal range of motion.     Cervical back: Normal range of motion.  Neurological:     General: No focal deficit present.     Mental Status: She is alert and oriented to person, place, and time. Mental status is at baseline.  Psychiatric:        Mood and Affect: Mood normal.        Behavior: Behavior normal.        Thought Content: Thought content normal.        Judgment: Judgment normal.      No results found for any visits on 04/04/22.  No results found for this or any previous visit (from the past 2160 hour(s)).    Assessment & Plan:   Problem List  Items Addressed This Visit     A-fib ([place])   Relevant Medications   rosuvastatin (CRESTOR) 40 MG tablet   HLD (hyperlipidemia)   Relevant Medications   rosuvastatin (CRESTOR) 40 MG tablet   IDA (iron deficiency anemia)   Depression - Primary    Return in about 3 months (around 07/05/2022) for F/U.   Total time spent: 30 minutes  Mechele Claude, FNP  04/04/2022

## 2022-04-05 ENCOUNTER — Inpatient Hospital Stay (HOSPITAL_BASED_OUTPATIENT_CLINIC_OR_DEPARTMENT_OTHER): Payer: Medicare PPO | Admitting: Internal Medicine

## 2022-04-05 ENCOUNTER — Inpatient Hospital Stay: Payer: Medicare PPO | Attending: Internal Medicine

## 2022-04-05 VITALS — BP 132/72 | HR 50 | Temp 98.2°F | Resp 18 | Wt 121.4 lb

## 2022-04-05 DIAGNOSIS — R7401 Elevation of levels of liver transaminase levels: Secondary | ICD-10-CM

## 2022-04-05 DIAGNOSIS — K5900 Constipation, unspecified: Secondary | ICD-10-CM | POA: Insufficient documentation

## 2022-04-05 DIAGNOSIS — Z8673 Personal history of transient ischemic attack (TIA), and cerebral infarction without residual deficits: Secondary | ICD-10-CM | POA: Diagnosis not present

## 2022-04-05 DIAGNOSIS — Z801 Family history of malignant neoplasm of trachea, bronchus and lung: Secondary | ICD-10-CM | POA: Diagnosis not present

## 2022-04-05 DIAGNOSIS — F17210 Nicotine dependence, cigarettes, uncomplicated: Secondary | ICD-10-CM | POA: Diagnosis not present

## 2022-04-05 DIAGNOSIS — J439 Emphysema, unspecified: Secondary | ICD-10-CM | POA: Insufficient documentation

## 2022-04-05 DIAGNOSIS — D649 Anemia, unspecified: Secondary | ICD-10-CM | POA: Diagnosis not present

## 2022-04-05 DIAGNOSIS — D509 Iron deficiency anemia, unspecified: Secondary | ICD-10-CM | POA: Diagnosis not present

## 2022-04-05 DIAGNOSIS — I4891 Unspecified atrial fibrillation: Secondary | ICD-10-CM | POA: Insufficient documentation

## 2022-04-05 DIAGNOSIS — Z7901 Long term (current) use of anticoagulants: Secondary | ICD-10-CM | POA: Diagnosis not present

## 2022-04-05 DIAGNOSIS — I129 Hypertensive chronic kidney disease with stage 1 through stage 4 chronic kidney disease, or unspecified chronic kidney disease: Secondary | ICD-10-CM | POA: Insufficient documentation

## 2022-04-05 DIAGNOSIS — N183 Chronic kidney disease, stage 3 unspecified: Secondary | ICD-10-CM | POA: Diagnosis not present

## 2022-04-05 DIAGNOSIS — Z8049 Family history of malignant neoplasm of other genital organs: Secondary | ICD-10-CM | POA: Diagnosis not present

## 2022-04-05 LAB — COMPREHENSIVE METABOLIC PANEL
ALT: 43 U/L (ref 0–44)
AST: 43 U/L — ABNORMAL HIGH (ref 15–41)
Albumin: 3.9 g/dL (ref 3.5–5.0)
Alkaline Phosphatase: 92 U/L (ref 38–126)
Anion gap: 8 (ref 5–15)
BUN: 19 mg/dL (ref 8–23)
CO2: 25 mmol/L (ref 22–32)
Calcium: 8.6 mg/dL — ABNORMAL LOW (ref 8.9–10.3)
Chloride: 105 mmol/L (ref 98–111)
Creatinine, Ser: 1.05 mg/dL — ABNORMAL HIGH (ref 0.44–1.00)
GFR, Estimated: 58 mL/min — ABNORMAL LOW (ref >60)
Glucose, Bld: 98 mg/dL (ref 70–99)
Potassium: 4.4 mmol/L (ref 3.5–5.1)
Sodium: 138 mmol/L (ref 135–145)
Total Bilirubin: 0.5 mg/dL (ref 0.3–1.2)
Total Protein: 7.1 g/dL (ref 6.5–8.1)

## 2022-04-05 LAB — CBC WITH DIFFERENTIAL/PLATELET
Abs Immature Granulocytes: 0.01 10*3/uL (ref 0.00–0.07)
Basophils Absolute: 0.1 10*3/uL (ref 0.0–0.1)
Basophils Relative: 2 %
Eosinophils Absolute: 0.2 10*3/uL (ref 0.0–0.5)
Eosinophils Relative: 3 %
HCT: 39.7 % (ref 36.0–46.0)
Hemoglobin: 12.5 g/dL (ref 12.0–15.0)
Immature Granulocytes: 0 %
Lymphocytes Relative: 27 %
Lymphs Abs: 1.8 10*3/uL (ref 0.7–4.0)
MCH: 27.7 pg (ref 26.0–34.0)
MCHC: 31.5 g/dL (ref 30.0–36.0)
MCV: 87.8 fL (ref 80.0–100.0)
Monocytes Absolute: 0.4 10*3/uL (ref 0.1–1.0)
Monocytes Relative: 7 %
Neutro Abs: 4 10*3/uL (ref 1.7–7.7)
Neutrophils Relative %: 61 %
Platelets: 212 10*3/uL (ref 150–400)
RBC: 4.52 MIL/uL (ref 3.87–5.11)
RDW: 13.6 % (ref 11.5–15.5)
WBC: 6.5 10*3/uL (ref 4.0–10.5)
nRBC: 0 % (ref 0.0–0.2)

## 2022-04-05 LAB — PROTIME-INR
INR: 3.4 — ABNORMAL HIGH (ref 0.9–1.2)
Prothrombin Time: 33.7 s — ABNORMAL HIGH (ref 9.1–12.0)

## 2022-04-05 LAB — IRON AND TIBC
Iron: 50 ug/dL (ref 28–170)
Saturation Ratios: 11 % (ref 10.4–31.8)
TIBC: 476 ug/dL — ABNORMAL HIGH (ref 250–450)
UIBC: 426 ug/dL

## 2022-04-05 LAB — FERRITIN: Ferritin: 11 ng/mL (ref 11–307)

## 2022-04-05 NOTE — Patient Instructions (Signed)
#  Recommend gentle iron [iron biglycinate; 28 mg ] 1 pill a day.  This pill is unlikely to cause stomach upset or cause constipation.  

## 2022-04-05 NOTE — Progress Notes (Signed)
[place] NOTE  Patient Care Team: Mechele Claude, FNP as PCP - General (Family Medicine) Cammie Sickle, MD as Consulting Physician (Oncology)  CHIEF COMPLAINTS/PURPOSE OF CONSULTATION: ANEMIA   HEMATOLOGY HISTORY  # JUNE 2023- ANEMIA[Hb; 8- MCV-platelets/WBC- WNL; Iron sat: 4%; ferritin: 13 [LL of N- 15] creat 1.1; CT/US- ;  EGD/colonoscopy-never  # A.fib on coumadin [Dr.Khan; Hx of Hematoma- JAN 2022- noted to have anemia at that time.  Hemoglobin around 7.  Admitted to hospital however did not have any blood transfusions on.  Patient was taken off Coumadin at the time for 3 months.  She is currently on Coumadin [Dr.Khan]]  # active smoker;   HISTORY OF PRESENTING ILLNESS: Alone.  Ambulating independently.  Kristin Haas 67 y.o.  female pleasant with a history of iron deficient anemia; history of stroke/A-fib on Coumadin is here for follow-up/review results of the CT scan.  Patient has not had any iron infusions in the last many months.   Starting taking iron pills can't take it every day due to constipation.  Trying to eat beets and liver more.   Appetite is fair, eats 1 meal per day with 1 snack at lunch. No blood in stool. Denies fatigue. No dizziness or dyspnea.      Review of Systems  Constitutional:  Positive for malaise/fatigue and weight loss. Negative for chills, diaphoresis and fever.  HENT:  Negative for nosebleeds and sore throat.   Eyes:  Negative for double vision.  Respiratory:  Negative for cough, hemoptysis, sputum production, shortness of breath and wheezing.   Cardiovascular:  Negative for chest pain, palpitations, orthopnea and leg swelling.  Gastrointestinal:  Negative for abdominal pain, blood in stool, constipation, diarrhea, heartburn, melena, nausea and vomiting.  Genitourinary:  Negative for dysuria, frequency and urgency.  Musculoskeletal:  Negative for back pain and joint pain.  Skin: Negative.  Negative for  itching and rash.  Neurological:  Positive for weakness. Negative for dizziness, tingling, focal weakness and headaches.  Endo/Heme/Allergies:  Does not bruise/bleed easily.  Psychiatric/Behavioral:  Negative for depression. The patient is not nervous/anxious and does not have insomnia.    MEDICAL HISTORY:  Past Medical History:  Diagnosis Date   Anxiety    Atrial fibrillation ([place])    Depression    Flushing 09/06/2015   Hypercholesteremia    Hypertension    Palpitation 09/06/2015   Rectus sheath hematoma, initial encounter 02/04/2020    SURGICAL HISTORY: Past Surgical History:  Procedure Laterality Date   HERNIA REPAIR     TONSILLECTOMY     TUBAL LIGATION      SOCIAL HISTORY: Social History   Socioeconomic History   Marital status: Single    Spouse name: Not on file   Number of children: Not on file   Years of education: Not on file   Highest education level: Not on file  Occupational History   Not on file  Tobacco Use   Smoking status: Every Day    Packs/day: 0.50    Years: 50.00    Additional pack years: 0.00    Total pack years: 25.00    Types: Cigarettes   Smokeless tobacco: Never   Tobacco comments:    Patient down to 2 packs/week, which is approximately 1/2 of her previous intake.   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  Active smoker.  No significant alcohol.   Social Determinants of Health   Financial Resource Strain: Not on file  Food Insecurity: Not on file  Transportation Needs: Not on file  Physical Activity: Not on file  Stress: Not on file  Social Connections: Not on file  Intimate Partner Violence: Not on file    FAMILY HISTORY: Family History  Problem Relation Age of Onset   Hypertension Father    Lung cancer  Father    Hypertension Sister    Uterine cancer Maternal Grandmother    Cervical cancer Paternal Grandmother    Lung cancer Son     ALLERGIES:  is allergic to codeine.  MEDICATIONS:  Current Outpatient Medications  Medication Sig Dispense Refill   acetaminophen (TYLENOL) 500 MG tablet Take 500 mg by mouth every 6 (six) hours as needed for mild pain.     amiodarone (PACERONE) 200 MG tablet Take 1 tablet by mouth once daily 90 tablet 0   busPIRone (BUSPAR) 5 MG tablet Take 5 mg by mouth 2 (two) times daily.     citalopram (CELEXA) 40 MG tablet Take 1 tablet (40 mg total) by mouth daily. 90 tablet 1   ferrous sulfate 324 MG TBEC Take 324 mg by mouth.     losartan (COZAAR) 25 MG tablet Take 1 tablet (25 mg total) by mouth daily. 30 tablet 11   metoprolol succinate (TOPROL-XL) 25 MG 24 hr tablet Take 25 mg by mouth daily.     rosuvastatin (CRESTOR) 40 MG tablet Take 1 tablet (40 mg total) by mouth daily. 90 tablet 3   Vitamin D, Ergocalciferol, (DRISDOL) 1.25 MG (50000 UNIT) CAPS capsule Take 50,000 Units by mouth once a week.     warfarin (COUMADIN) 2 MG tablet Take 1 tablet (2 mg total) by mouth daily. 30 tablet 3   Melatonin 10 MG TABS Take 1 tablet by mouth at bedtime. (Patient not taking: Reported on 04/05/2022)     No current facility-administered medications for this visit.     Marland Kitchen  PHYSICAL EXAMINATION:   Vitals:   04/05/22 1340  BP: 132/72  Pulse: (!) 50  Resp: 18  Temp: 98.2 F (36.8 C)  SpO2: 100%   Filed Weights   04/05/22 1340  Weight: 121 lb 6.4 oz (55.1 kg)    Physical Exam Vitals and nursing note reviewed.  HENT:     Head: Normocephalic and atraumatic.     Mouth/Throat:     Pharynx: Oropharynx is clear.  Eyes:     Extraocular Movements: Extraocular movements intact.     Pupils: Pupils are equal, round, and reactive to light.  Cardiovascular:     Rate and Rhythm: Normal rate and regular rhythm.  Pulmonary:     Comments: Decreased breath sounds  bilaterally.  Abdominal:     Palpations: Abdomen is soft.  Musculoskeletal:        General: Normal range of motion.     Cervical back: Normal range of motion.  Skin:    General: Skin is warm.  Neurological:     General: No focal deficit present.     Mental Status: She is alert and oriented to person, place, and time.  Psychiatric:        Behavior: Behavior normal.        Judgment: Judgment normal.      LABORATORY DATA:  I  have reviewed the data as listed Lab Results  Component Value Date   WBC 6.5 04/05/2022   HGB 12.5 04/05/2022   HCT 39.7 04/05/2022   MCV 87.8 04/05/2022   PLT 212 04/05/2022   Recent Labs    07/29/21 1419 12/05/21 1305 04/05/22 1326  NA 136 134* 138  K 3.8 3.8 4.4  CL 106 103 105  CO2 23 25 25   GLUCOSE 152* 166* 98  BUN 20 18 19   CREATININE 1.30* 1.49* 1.05*  CALCIUM 8.3* 8.9 8.6*  GFRNONAA 45* 38* 58*  PROT 6.9 7.2 7.1  ALBUMIN 3.7 3.8 3.9  AST 69* 88* 43*  ALT 56* 100* 43  ALKPHOS 101 111 92  BILITOT 0.3 0.3 0.5     No results found.  ASSESSMENT & PLAN:   Symptomatic anemia # JUNE 2023- [PCP; Hb; 8--platelets/WBC- WNL; Iron sat: 4%; ferritin: 13 [LL of N- 15] secondary to to iron deficiency. s/p venofer. HOLD IV venofer.  Currently on iron sulfate causing constipation.  Recommend gentle iron every 3 days.   #Etiology of iron deficiency: pt declines GI evaluation-EGD colonoscopy/ capsule study.    # History of lung nodule/opacity active smoker -CT scan January 2024 -left upper lobe lung resolved.  Previous right upper lobe lung nodule-improved question scar.  As per radiology,additional chest CT in 6-12 months to confirm stability.   # A.fib/ hx of stroke on  Coumadin [Dr.Khan; cardiology]  # CKD- stage III- stable.    # Active smoker-counseled to quit; patient states-she is cutting down.    Discussed regarding lung cancer screening program at length; which includes low-dose CT scan on annual basis for 5 years.  Based upon the  findings patient would be recommended surveillance/biopsies/or surgery.  Lung cancer program has shown to save lives by early detection of lung cancer.  Patient was asked to inform PCP  if she was interested in pursuing the program.    #Incidental findings on Imaging  CT , 2024: emphysema; atherosclerosis I reviewed/discussed/counseled the patient.   # DISPOSITION: # HOLD venofer today.  # follow up in  6 months-MD; labs cbc/;cmp; iron studies ferritin possible venofer;-Dr.B  # I reviewed the blood work- with the patient in detail; also reviewed the imaging independently [as summarized above]; and with the patient in detail.    Cc; Dr. Humphrey Rolls  All questions were answered. The patient knows to call the clinic with any problems, questions or concerns.  Cammie Sickle, MD 04/05/2022 4:39 PM

## 2022-04-05 NOTE — Assessment & Plan Note (Addendum)
#   JUNE 2023- [PCP; Hb; 8--platelets/WBC- WNL; Iron sat: 4%; ferritin: 13 [LL of N- 15] secondary to to iron deficiency. s/p venofer. HOLD IV venofer.  Currently on iron sulfate causing constipation.  Recommend gentle iron every 3 days.   #Etiology of iron deficiency: pt declines GI evaluation-EGD colonoscopy/ capsule study.    # History of lung nodule/opacity active smoker -CT scan January 2024 -left upper lobe lung resolved.  Previous right upper lobe lung nodule-improved question scar.  As per radiology,additional chest CT in 6-12 months to confirm stability.   # A.fib/ hx of stroke on  Coumadin [Dr.Khan; cardiology]  # CKD- stage III- stable.    # Active smoker-counseled to quit; patient states-she is cutting down.    Discussed regarding lung cancer screening program at length; which includes low-dose CT scan on annual basis for 5 years.  Based upon the findings patient would be recommended surveillance/biopsies/or surgery.  Lung cancer program has shown to save lives by early detection of lung cancer.  Patient was asked to inform PCP  if she was interested in pursuing the program.    #Incidental findings on Imaging  CT , 2024: emphysema; atherosclerosis I reviewed/discussed/counseled the patient.   # DISPOSITION: # HOLD venofer today.  # follow up in  6 months-MD; labs cbc/;cmp; iron studies ferritin possible venofer;-Dr.B  # I reviewed the blood work- with the patient in detail; also reviewed the imaging independently [as summarized above]; and with the patient in detail.    Cc; Dr. Humphrey Rolls

## 2022-04-05 NOTE — Progress Notes (Signed)
Starting taking iron pills can't take it every day due to constipation. Trying to eat beets and liver more. Appetite is fair, eats 1 meal per day with 1 snack at lunch. No blood in stool. Denies fatigue. No dizziness or dyspnea.

## 2022-04-08 ENCOUNTER — Other Ambulatory Visit: Payer: Self-pay

## 2022-04-08 ENCOUNTER — Emergency Department (HOSPITAL_COMMUNITY): Payer: Medicare PPO

## 2022-04-08 ENCOUNTER — Emergency Department (HOSPITAL_COMMUNITY)
Admission: EM | Admit: 2022-04-08 | Discharge: 2022-04-08 | Disposition: A | Discharge status: Home / Self Care | Payer: Medicare PPO | Attending: Emergency Medicine | Admitting: Emergency Medicine

## 2022-04-08 DIAGNOSIS — I4891 Unspecified atrial fibrillation: Secondary | ICD-10-CM | POA: Insufficient documentation

## 2022-04-08 DIAGNOSIS — Z79899 Other long term (current) drug therapy: Secondary | ICD-10-CM | POA: Insufficient documentation

## 2022-04-08 DIAGNOSIS — Z7901 Long term (current) use of anticoagulants: Secondary | ICD-10-CM | POA: Diagnosis not present

## 2022-04-08 DIAGNOSIS — W19XXXA Unspecified fall, initial encounter: Secondary | ICD-10-CM

## 2022-04-08 DIAGNOSIS — M25511 Pain in right shoulder: Secondary | ICD-10-CM | POA: Diagnosis not present

## 2022-04-08 DIAGNOSIS — S42221A 2-part displaced fracture of surgical neck of right humerus, initial encounter for closed fracture: Secondary | ICD-10-CM | POA: Diagnosis not present

## 2022-04-08 DIAGNOSIS — S4991XA Unspecified injury of right shoulder and upper arm, initial encounter: Secondary | ICD-10-CM | POA: Diagnosis present

## 2022-04-08 DIAGNOSIS — W06XXXA Fall from bed, initial encounter: Secondary | ICD-10-CM | POA: Insufficient documentation

## 2022-04-08 DIAGNOSIS — I10 Essential (primary) hypertension: Secondary | ICD-10-CM | POA: Diagnosis not present

## 2022-04-08 DIAGNOSIS — S42211A Unspecified displaced fracture of surgical neck of right humerus, initial encounter for closed fracture: Secondary | ICD-10-CM | POA: Diagnosis not present

## 2022-04-08 MED ORDER — OXYCODONE-ACETAMINOPHEN 5-325 MG PO TABS
1.0000 | ORAL_TABLET | Freq: Once | ORAL | Status: AC
  Administered 2022-04-08: 1 via ORAL
  Filled 2022-04-08: qty 1

## 2022-04-08 MED ORDER — OXYCODONE-ACETAMINOPHEN 5-325 MG PO TABS: 1.0000 | ORAL_TABLET | Freq: Four times a day (QID) | ORAL | 0 refills | Status: DC | PRN

## 2022-04-08 NOTE — ED Triage Notes (Signed)
Pt reports right shoulder pain that started after falling out of bed pta.

## 2022-04-08 NOTE — Discharge Instructions (Addendum)
You were seen in the emergency department today for right shoulder pain after an injury.  As we discussed you fractured the humeral neck of your right shoulder.  I discussed your case with Dr. Ninfa Linden with [facility], and he believes he will likely need a shoulder replacement.  He wants you to follow-up with their office first thing next week, and have attached their contact information.  In the meantime I sent a prescription for pain medication to your pharmacy. You can take additional tylenol for pain, not to exceed a total of 4000 mg (4g) in a 24 hour period.   When you call to follow-up with them, please make sure you mention that you are on warfarin, as they may have specific recommendations for you.  Continue to monitor how you're doing and return to the ER for new or worsening symptoms.

## 2022-04-08 NOTE — ED Provider Notes (Signed)
[place] EMERGENCY DEPARTMENT AT [facility] Provider Note   CSN: [id number] Arrival date & time: 04/08/22  1401     History  Chief Complaint  Patient presents with   Shoulder Pain    Kristin Haas is a 67 y.o. female with history of anxiety, depression, hypertension, A-fib on warfarin, hyperlipidemia who presents the emergency department complaining of right shoulder pain.  Patient had gotten up this morning and went outside on her deck, slipped and fell on her backside.  Braced her fall with her right hand.  Immediately had severe pain in the right shoulder, and thought she dislocated it.   Shoulder Pain      Home Medications Prior to Admission medications   Medication Sig Start Date End Date Taking? Authorizing Provider  oxyCODONE-acetaminophen (PERCOCET/ROXICET) 5-325 MG tablet Take 1 tablet by mouth every 6 (six) hours as needed for severe pain. 04/08/22  Yes Waniya Hoglund T, PA-C  acetaminophen (TYLENOL) 500 MG tablet Take 500 mg by mouth every 6 (six) hours as needed for mild pain.    [provider]  amiodarone (PACERONE) 200 MG tablet Take 1 tablet by mouth once daily 03/16/22   Dionisio David, MD  busPIRone (BUSPAR) 5 MG tablet Take 5 mg by mouth 2 (two) times daily. 06/30/21   [provider]  citalopram (CELEXA) 40 MG tablet Take 1 tablet (40 mg total) by mouth daily. 02/23/22   Mechele Claude, FNP  ferrous sulfate 324 MG TBEC Take 324 mg by mouth.    [provider]  losartan (COZAAR) 25 MG tablet Take 1 tablet (25 mg total) by mouth daily. 03/24/22   Dionisio David, MD  Melatonin 10 MG TABS Take 1 tablet by mouth at bedtime. Patient not taking: Reported on 04/05/2022    [provider]  metoprolol succinate (TOPROL-XL) 25 MG 24 hr tablet Take 25 mg by mouth daily.    [provider]  rosuvastatin (CRESTOR) 40 MG tablet Take 1 tablet (40 mg total) by mouth daily. 04/04/22   Mechele Claude, FNP  Vitamin D,  Ergocalciferol, (DRISDOL) 1.25 MG (50000 UNIT) CAPS capsule Take 50,000 Units by mouth once a week. 04/01/21   [provider]  warfarin (COUMADIN) 2 MG tablet Take 1 tablet (2 mg total) by mouth daily. 03/24/22   Dionisio David, MD      Allergies    Codeine    Review of Systems   Review of Systems  Musculoskeletal:  Positive for arthralgias.  All other systems reviewed and are negative.   Physical Exam Updated Vital Signs BP 127/73 (BP Location: Left Arm)   Pulse (!) 58   Temp 98.1 F (36.7 C) (Oral)   Resp 14   Ht 5\' 6"  (1.676 m)   Wt 55 kg   SpO2 99%   BMI 19.57 kg/m  Physical Exam Vitals and nursing note reviewed.  Constitutional:      Appearance: Normal appearance.  HENT:     Head: Normocephalic and atraumatic.  Eyes:     Conjunctiva/sclera: Conjunctivae normal.  Cardiovascular:     Pulses:          Radial pulses are 2+ on the right side and 2+ on the left side.  Pulmonary:     Effort: Pulmonary effort is normal. No respiratory distress.  Musculoskeletal:     Comments: Anterior protrusion of the right shoulder. Normal ROM of the right hand digits, sensation intact.  Skin:    General:  Skin is warm and dry.  Neurological:     Mental Status: She is alert.  Psychiatric:        Mood and Affect: Mood normal.        Behavior: Behavior normal.     ED Results / Procedures / Treatments   Labs (all labs ordered are listed, but only abnormal results are displayed) Labs Reviewed - No data to display  EKG None  Radiology DG Shoulder Right  Result Date: 04/08/2022 CLINICAL DATA:  Fall, right shoulder EXAM: RIGHT SHOULDER - 2+ VIEW COMPARISON:  02/04/2020 FINDINGS: Acute fracture involving the surgical neck of the proximal right humerus with anterolateral displacement and varus angulation. Suspected nondisplaced fracture involvement of the greater tuberosity. Glenohumeral joint alignment is maintained without dislocation. Intact AC joint. Soft tissue swelling  at the fracture site. IMPRESSION: Acute displaced and angulated fracture of the right humeral neck. Electronically Signed   By: Davina Poke D.O.   On: 04/08/2022 15:00    Procedures Procedures    Medications Ordered in ED Medications  oxyCODONE-acetaminophen (PERCOCET/ROXICET) 5-325 MG per tablet 1 tablet (1 tablet Oral Given 04/08/22 1536)    ED Course/ Medical Decision Making/ A&P                             Medical Decision Making Risk Prescription drug management.  This patient is a 67 y.o. female  who presents to the ED for concern of R shoulder pain after fall.   Past Medical History / Co-morbidities: anxiety, depression, hypertension, A-fib on warfarin, hyperlipidemia  Physical Exam: Physical exam performed. The pertinent findings include: Anterior protrusion of the left shoulder with bruising noted.  Neurovascular intact in bilateral upper extremities, normal grip strength.  Lab Tests/Imaging studies: I personally interpreted labs/imaging and the pertinent results include: Right shoulder x-ray with acute displaced and angulated fracture of the right humeral neck. I agree with the radiologist interpretation.  Medications: I ordered medication including Percocet.  I have reviewed the patients home medicines and have made adjustments as needed.  Patient placed in shoulder immobilizer.  Consultations obtained: I consulted with orthopedic surgeon Dr. Ninfa Linden, who recommended immobilization and pain control, and follow-up in their office next week.  He suspected that patient will likely need a shoulder replacement.   Disposition: After consideration of the diagnostic results and the patients response to treatment, I feel that emergency department workup does not suggest an emergent condition requiring admission or immediate intervention beyond what has been performed at this time. The plan is: Discharged home with pain control, mobilization, orthopedic follow-up.  Did  not recommend changes in her warfarin at this time, but given close ER return precautions.  The patient is safe for discharge and has been instructed to return immediately for worsening symptoms, change in symptoms or any other concerns.  Final Clinical Impression(s) / ED Diagnoses Final diagnoses:  Closed 2-part displaced fracture of surgical neck of right humerus, initial encounter  Fall, initial encounter    Rx / DC Orders ED Discharge Orders          Ordered    oxyCODONE-acetaminophen (PERCOCET/ROXICET) 5-325 MG tablet  Every 6 hours PRN        04/08/22 1624           Portions of this report may have been transcribed using voice recognition software. Every effort was made to ensure accuracy; however, inadvertent computerized transcription errors may be present.    Cyndie Woodbeck,  Damarko Stitely T, PA-C 04/08/22 1639    Charlesetta Shanks, MD 04/10/22 [id number]

## 2022-04-11 ENCOUNTER — Telehealth: Payer: Self-pay | Admitting: Family

## 2022-04-11 NOTE — Telephone Encounter (Signed)
Patient would like to inform Estill Bamberg that she broke her arm on 3/23 and has major bruising. Patient and her daughter are both very concerned about her being on warfarin with this bruising and broken arm. Patient hasn't taken her warfarin in 3 days. Was advised based off of her recent INR that she should hold the warfarin for 2 days, restart then have INR redrawn. I told patient she may go ahead and restart INR and come for bloodwork in one week. Patient's daughter is very concerned about the bruising and about patient not being able to get in with ortho before next week. Would Estill Bamberg like to see patient and evaluate her for the bruising and warfarin or not? Please call patient.

## 2022-04-12 MED ORDER — OXYCODONE-ACETAMINOPHEN 5-325 MG PO TABS: 1.0000 | ORAL_TABLET | Freq: Four times a day (QID) | ORAL | 0 refills | Status: DC | PRN

## 2022-04-14 ENCOUNTER — Telehealth: Payer: Self-pay | Admitting: *Deleted

## 2022-04-14 NOTE — Telephone Encounter (Signed)
        Patient  visited [facility] on 04/08/2022  for treatment    Telephone encounter attempt : 1st   A HIPAA compliant voice message was left requesting a return call.  Instructed patient to call back at [phone].  [place] [phone] [address] , [place] [address] Email : [email or web address]

## 2022-04-17 DIAGNOSIS — S42291A Other displaced fracture of upper end of right humerus, initial encounter for closed fracture: Secondary | ICD-10-CM

## 2022-04-17 HISTORY — DX: Other displaced fracture of upper end of right humerus, initial encounter for closed fracture: S42.291A

## 2022-04-17 NOTE — Progress Notes (Unsigned)
   Office Visit Note   Patient: Kristin Haas           Date of Birth: [birth year]           MRN: [id number] Visit Date: 04/18/2022              Requested by: Mechele Claude, [address] [place],  [place] [id number] PCP: Mechele Claude, FNP   Assessment & Plan: Visit Diagnoses:  1. Closed 3-part fracture of proximal humerus, right, initial encounter     Plan: Impression is displaced right proximal humerus fracture.  Given the fracture pattern and age I would like to refer patient to Dr. Marlou Sa for his expertise on whether this repairable or needs to be replaced.  We will have her see Dr. Marlou Sa this afternoon.  Follow-Up Instructions: No follow-ups on file.   Orders:  No orders of the defined types were placed in this encounter.  No orders of the defined types were placed in this encounter.     Procedures: No procedures performed   Clinical Data: No additional findings.   Subjective: No chief complaint on file.   HPI  Patient is a follow-up from the ER for right proximal humerus fracture.  Review of Systems   Objective: Vital Signs: There were no vitals taken for this visit.  Physical Exam  Ortho Exam  Examination right shoulder shows extensive ecchymosis and a little bit of puckering to the anterior skin of the shoulder.  No neurovascular compromise.  Specialty Comments:  No specialty comments available.  Imaging: No results found.   PMFS History: Patient Active Problem List   Diagnosis Date Noted   Closed 3-part fracture of proximal humerus, right, initial encounter 04/17/2022   Depression 04/04/2022   IDA (iron deficiency anemia) 12/06/2021   Abnormal CT of the chest 12/06/2021   Symptomatic anemia 07/11/2021   A-fib 02/04/2020   Supratherapeutic INR 02/04/2020   HTN (hypertension) 02/04/2020   HLD (hyperlipidemia) 02/04/2020   Past Medical History:  Diagnosis Date   Anxiety    Atrial fibrillation ([place])    Depression    Flushing  09/06/2015   Hypercholesteremia    Hypertension    Palpitation 09/06/2015   Rectus sheath hematoma, initial encounter 02/04/2020    Family History  Problem Relation Age of Onset   Hypertension Father    Lung cancer Father    Hypertension Sister    Uterine cancer Maternal Grandmother    Cervical cancer Paternal Grandmother    Lung cancer Son     Past Surgical History:  Procedure Laterality Date   HERNIA REPAIR     TONSILLECTOMY     TUBAL LIGATION     Social History   Occupational History   Not on file  Tobacco Use   Smoking status: Every Day    Packs/day: 0.50    Years: 50.00    Additional pack years: 0.00    Total pack years: 25.00    Types: Cigarettes   Smokeless tobacco: Never   Tobacco comments:    Patient down to 2 packs/week, which is approximately 1/2 of her previous intake.   Substance and Sexual Activity   Alcohol use: Yes    Comment: occasional/maybe once a year   Drug use: No   Sexual activity: Never

## 2022-04-18 ENCOUNTER — Ambulatory Visit
Admission: RE | Admit: 2022-04-18 | Discharge: 2022-04-18 | Disposition: A | Discharge status: Home / Self Care | Payer: Medicare PPO | Source: Ambulatory Visit | Attending: Orthopedic Surgery | Admitting: Orthopedic Surgery

## 2022-04-18 ENCOUNTER — Encounter: Payer: Self-pay | Admitting: Orthopedic Surgery

## 2022-04-18 ENCOUNTER — Ambulatory Visit (INDEPENDENT_AMBULATORY_CARE_PROVIDER_SITE_OTHER): Payer: Medicare PPO | Admitting: Orthopaedic Surgery

## 2022-04-18 ENCOUNTER — Ambulatory Visit: Payer: Medicare PPO | Admitting: Orthopedic Surgery

## 2022-04-18 DIAGNOSIS — M25511 Pain in right shoulder: Secondary | ICD-10-CM | POA: Diagnosis not present

## 2022-04-18 DIAGNOSIS — S42291A Other displaced fracture of upper end of right humerus, initial encounter for closed fracture: Secondary | ICD-10-CM

## 2022-04-18 MED ORDER — OXYCODONE-ACETAMINOPHEN 5-325 MG PO TABS: 1.0000 | ORAL_TABLET | Freq: Four times a day (QID) | ORAL | 0 refills | Status: DC | PRN

## 2022-04-18 NOTE — Progress Notes (Signed)
Office Visit Note   Patient: Kristin Haas           Date of Birth: Oct 18, 1955           MRN: [id number] Visit Date: 04/18/2022 Requested by: Mechele Claude, [address] [place],  [place] [address] PCP: Mechele Claude, FNP  Subjective: Chief Complaint  Patient presents with   Right Shoulder - Injury    HPI: Kristin Haas is a 67 y.o. female who presents to the office reporting right shoulder pain.  Patient sustained a right proximal humerus fracture about 12 days ago.  Had a mechanical fall and injured her shoulder.  This is her first follow-up appointment.  Denies any other orthopedic complaints.  Patient is a smoker.  Does take Coumadin as well for atrial fibrillation but has not been taking it since the 25th.  Radiographs are reviewed and it does show what appears to be a 2 or 3 part proximal humerus fracture with significant displacement of the shaft anterior relative to the head.  The head remains located..                ROS: All systems reviewed are negative as they relate to the chief complaint within the history of present illness.  Patient denies fevers or chills.  Assessment & Plan: Visit Diagnoses:  1. Closed 3-part fracture of proximal humerus, right, initial encounter     Plan: Impression is right proximal humerus fracture.  Patient is a smoker.  Has high risk for nonunion or malunion.  Operative treatment indicated due to the amount of displacement.  CT scanning has been ordered and is available at the time of this dictation and it does show predominantly a 2 part fracture which should be amenable to reduction and plate fixation.  Would also like to do B MAC with bone graftingBoost the biological component of the healing.  She is at high risk of fracture nonunion and possibly avascular necrosis.  The risk and benefits of the procedure are discussed with the patient including not limited to infection or vessel damage shoulder stiffness as well as potential need for  more surgery.  Expected nature of the rehabilitative process is also discussed.  All questions answered.  Follow-Up Instructions: No follow-ups on file.   Orders:  Orders Placed This Encounter  Procedures   CT SHOULDER RIGHT WO CONTRAST   Meds ordered this encounter  Medications   oxyCODONE-acetaminophen (PERCOCET/ROXICET) 5-325 MG tablet    Sig: Take 1 tablet by mouth every 6 (six) hours as needed for severe pain.    Dispense:  20 tablet    Refill:  0      Procedures: No procedures performed   Clinical Data: No additional findings.  Objective: Vital Signs: There were no vitals taken for this visit.  Physical Exam:  Constitutional: Patient appears well-developed HEENT:  Head: Normocephalic Eyes:EOM are normal Neck: Normal range of motion Cardiovascular: Normal rate Pulmonary/chest: Effort normal Neurologic: Patient is alert Skin: Skin is warm Psychiatric: Patient has normal mood and affect  Ortho Exam: Ortho exam demonstrates ecchymosis and bruising in the right upper extremity.  Shaft is palpable anteriorly.  Motor or sensory function hand is intact.  Radial pulses intact.  Deltoid does fire.  Elbow nontender. Specialty Comments:  No specialty comments available.  Imaging: CT SHOULDER RIGHT WO CONTRAST  Result Date: 04/18/2022 CLINICAL DATA:  Right shoulder pain after fall on 04/08/2022 EXAM: CT OF THE UPPER RIGHT EXTREMITY WITHOUT CONTRAST TECHNIQUE:  Multidetector CT imaging of the upper right extremity was performed according to the standard protocol. RADIATION DOSE REDUCTION: This exam was performed according to the departmental dose-optimization program which includes automated exposure control, adjustment of the mA and/or kV according to patient size and/or use of iterative reconstruction technique. COMPARISON:  X-ray 04/08/2022 FINDINGS: Bones/Joint/Cartilage Subacute fracture of the right humeral head and neck with transversely oriented component through the  surgical neck which is moderately impacted and demonstrates varus angulation. Nondisplaced fracture extending to the proximal metaphysis along the medial cortex. Nondisplaced mildly comminuted fracture of the greater tuberosity. No fracture of the lesser tuberosity or humeral head articular surface. Glenohumeral joint alignment is maintained without dislocation. Intact AC joint with mild osteoarthritis. Remaining visualized osseous structures are otherwise intact and unremarkable. No lytic or sclerotic bone lesion. Ligaments Suboptimally assessed by CT. Muscles and Tendons No rotator cuff muscle atrophy or fatty infiltration. Rotator cuff tendons are not well assessed by CT. Soft tissues No organized fluid collection or hematoma. No right axillary lymphadenopathy. Included portion of the right lung field is clear. IMPRESSION: Subacute fracture of the right humeral head and neck, as described. Electronically Signed   By: Davina Poke D.O.   On: 04/18/2022 15:00     PMFS History: Patient Active Problem List   Diagnosis Date Noted   Closed 3-part fracture of proximal humerus, right, initial encounter 04/17/2022   Depression 04/04/2022   IDA (iron deficiency anemia) 12/06/2021   Abnormal CT of the chest 12/06/2021   Symptomatic anemia 07/11/2021   A-fib 02/04/2020   Supratherapeutic INR 02/04/2020   HTN (hypertension) 02/04/2020   HLD (hyperlipidemia) 02/04/2020   Past Medical History:  Diagnosis Date   Anxiety    Atrial fibrillation    Depression    Flushing 09/06/2015   Hypercholesteremia    Hypertension    Palpitation 09/06/2015   Rectus sheath hematoma, initial encounter 02/04/2020    Family History  Problem Relation Age of Onset   Hypertension Father    Lung cancer Father    Hypertension Sister    Uterine cancer Maternal Grandmother    Cervical cancer Paternal Grandmother    Lung cancer Son     Past Surgical History:  Procedure Laterality Date   HERNIA REPAIR      TONSILLECTOMY     TUBAL LIGATION     Social History   Occupational History   Not on file  Tobacco Use   Smoking status: Every Day    Packs/day: 0.50    Years: 50.00    Additional pack years: 0.00    Total pack years: 25.00    Types: Cigarettes   Smokeless tobacco: Never   Tobacco comments:    Patient down to 2 packs/week, which is approximately 1/2 of her previous intake.   Substance and Sexual Activity   Alcohol use: Yes    Comment: occasional/maybe once a year   Drug use: No   Sexual activity: Never

## 2022-04-19 ENCOUNTER — Other Ambulatory Visit: Payer: Self-pay

## 2022-04-19 ENCOUNTER — Telehealth: Payer: Self-pay | Admitting: *Deleted

## 2022-04-19 ENCOUNTER — Encounter (HOSPITAL_COMMUNITY): Payer: Self-pay | Admitting: Orthopedic Surgery

## 2022-04-19 NOTE — Progress Notes (Signed)
SDW call  Patient was given pre-op instructions over the phone. Patient verbalized understanding of instructions provided.     PCP - Georgian Co, FNP Cardiologist - Dr. Wyn Forster, [place], [place]   PPM/ICD - Denies  Chest x-ray - n/a EKG -  DOS, 04/20/2022 Stress Test - Denies ECHO - 09/07/15 Cardiac Cath - Denies  Sleep Study/sleep apnea/CPAP: States had a sleep study but was not diagnosed with sleep apnea and does not wear a CPAP  Non-diabetic   Blood Thinner Instructions: Patient is on Coumadin for A-fib, but states last dose 04/10/2022 Aspirin Instructions: Denies   ERAS Protcol - Yes, clear liquids until 3 hours prior to surgery. Last PO fluids at 0730 PRE-SURGERY Ensure or G2- No   COVID TEST- n/a    Anesthesia review: Yes.  HTN, A-fib, COPD, on coumadin with last dose 04/10/2022   Patient denies shortness of breath, fever, cough and chest pain over the phone call    Your procedure is scheduled on Thursday, April 20, 2022  Report to [facility] Main Entrance "A" at 0800 A.M., then check in with the Admitting office.  Call this number if you have problems the morning of surgery:  [phone]   If you have any questions prior to your surgery date call [phone]: Open Monday-Friday 8am-4pm If you experience any cold or flu symptoms such as cough, fever, chills, shortness of breath, etc. between now and your scheduled surgery, please notify us at the above number     Remember:  Do not eat after midnight the night before your surgery  You may drink clear liquids until  0730 the morning of your surgery.   Clear liquids allowed are: Water, Non-Citrus Juices (without pulp), Carbonated Beverages, Clear Tea, Black Coffee ONLY (NO MILK, CREAM OR POWDERED CREAMER of any kind), and Gatorade   Take these medicines the morning of surgery with A SIP OF WATER:  Amiodarone, celexa, metoprolol  As needed: Tylenol, percocet  As of today, STOP taking any Aspirin (unless  otherwise instructed by your surgeon) Aleve, Naproxen, Ibuprofen, Motrin, Advil, Goody's, BC's, all herbal medications, fish oil, and all vitamins.

## 2022-04-19 NOTE — Anesthesia Preprocedure Evaluation (Addendum)
Anesthesia Evaluation  Patient identified by MRN, date of birth, ID band Patient awake    Reviewed: Allergy & Precautions, NPO status , Patient's Chart, lab work & pertinent test results  Airway Mallampati: II  TM Distance: >3 FB Neck ROM: Full    Dental no notable dental hx. (+) Loose, Edentulous Upper, Missing, Poor Dentition,    Pulmonary COPD, Current SmokerPatient did not abstain from smoking.   Pulmonary exam normal breath sounds clear to auscultation       Cardiovascular hypertension, Normal cardiovascular exam+ dysrhythmias Atrial Fibrillation  Rhythm:Regular Rate:Normal     Neuro/Psych  PSYCHIATRIC DISORDERS Anxiety Depression       GI/Hepatic   Endo/Other    Renal/GU Lab Results      Component                Value               Date                      CREATININE               1.05 (H)            04/05/2022                     K                        4.4                 04/05/2022                      Musculoskeletal   Abdominal   Peds  Hematology  On coumadin stopped 3/25 Lab Results      Component                Value               Date                      WBC                      6.5                 04/05/2022                HGB                      12.5                04/05/2022                HCT                      39.7                04/05/2022                MCV                      87.8                04/05/2022                PLT  212                 04/05/2022              Anesthesia Other Findings   Reproductive/Obstetrics                             Anesthesia Physical Anesthesia Plan  ASA: 3  Anesthesia Plan: General and Regional   Post-op Pain Management: Regional block*, Minimal or no pain anticipated and Tylenol PO (pre-op)*   Induction: Intravenous  PONV Risk Score and Plan: 3 and Treatment may vary due to age or medical  condition, Midazolam and Ondansetron  Airway Management Planned: Oral ETT  Additional Equipment: None  Intra-op Plan:   Post-operative Plan: Extubation in OR  Informed Consent: I have reviewed the patients History and Physical, chart, labs and discussed the procedure including the risks, benefits and alternatives for the proposed anesthesia with the patient or authorized representative who has indicated his/her understanding and acceptance.     Dental advisory given  Plan Discussed with: CRNA, Surgeon and Anesthesiologist  Anesthesia Plan Comments: (PAT note written 04/19/2022 by Myra Gianotti, PA-C.  GA w R ISB w exparel  )       Anesthesia Quick Evaluation

## 2022-04-19 NOTE — Progress Notes (Signed)
Anesthesia Chart Review: SAME DAY WORK-UP  Case: [id number] Date/Time: 04/20/22 1005   Procedure: OPEN REDUCTION INTERNAL  FIXATION (ORIF) RIGHT PROXIMAL HUMERUS FRACTURE & BONE MARROW ASPIRATE FROM ILIAC CREST (Right: Shoulder)   Anesthesia type: General   Pre-op diagnosis: right shoulder fracture   [facility]: [facility] ROOM 07 / [facility] OR   Surgeons: Meredith Pel, MD       DISCUSSION: Patient is a 67 year old female scheduled for the above procedure. She had a mechanical fall on 04/08/22 and sustained an acute displaced and angulated fracture of the right humeral neck. Orthopedic surgeon contacted with recommendation for immobilization, pain control and close out-patient follow-up. She was seen by Dr. Marlou Sa on 04/18/22. CT scan of the right shoulder showed "predominately a 2 part fracture which should be amenable to reduction and plate fixation. Would also like to do B MAC with bone graftingBoost the biological component of the healing.  She is at high risk of fracture nonunion and possibly avascular necrosis."  She reported last warfarin was on 04/10/22.   History includes smoking, HTN, afib, COPD, hypercholesterolemia, rectus sheath hematoma (02/04/20, in setting of warfarin and hitting her abd on furniture, treated with temporary reversal of anticoagulation).   Last cardiology records requested from Dr. Neoma Laming at [facility]. It appears they are recently on Epic as I see 04/03/12 PCP visit from 04/04/22, but do not see any cardiology notes other than refills. She denied chest pain and SOB. Normal LVEF by 2017 echo.   Anesthesia team to evaluate on the day of surgery. She had a CBC and CMET on 04/05/22, but will at least need an PT/INR--EKG as well if one not received within the past year.    VS: Ht 5\' 6"  (1.676 m)   Wt 55.3 kg   BMI 19.69 kg/m  BP Readings from Last 3 Encounters:  04/08/22 (!) 119/59  04/05/22 132/72  04/04/22 120/68   Pulse Readings from Last 3 Encounters:   04/08/22 (!) 44  04/05/22 (!) 50  04/04/22 (!) 52    PROVIDERS: Mechele Claude, FNP is PCP  Neoma Laming, MD is cardiologist ([facility]) Charlaine Dalton, MD is HEM. Last evaluation 04/05/22 for anemia follow-up. S/p Venofer for IDA, then oral iron. She declined GI evaluation. Six month follow-up planned.     LABS: Most recent lab results in [facility] include: Lab Results  Component Value Date   WBC 6.5 04/05/2022   HGB 12.5 04/05/2022   HCT 39.7 04/05/2022   PLT 212 04/05/2022   GLUCOSE 98 04/05/2022   ALT 43 04/05/2022   AST 43 (H) 04/05/2022   NA 138 04/05/2022   K 4.4 04/05/2022   CL 105 04/05/2022   CREATININE 1.05 (H) 04/05/2022   BUN 19 04/05/2022   CO2 25 04/05/2022   INR 3.4 (H) 04/04/2022    IMAGES: CT right Shoulder 05/18/22: IMPRESSION: Subacute fracture of the right humeral head and neck, as described.  CT Chest 02/10/22: IMPRESSION: 1. The previous left upper lobe ground-glass opacity has completely resolved. 2. The previous right upper lobe ground-glass opacity has contracted with mild residual L-shaped linear density, favor scarring. Consider an additional chest CT in 6-12 months to confirm stability. 3. Mild emphysema. Aortic atherosclerosis and coronary artery calcifications.    EKG: Last EKG currently available is > 82 year old. Requested last tracing from Dr. Laurelyn Sickle office.    CV: Echo 09/07/15: Study Conclusions  - Left ventricle: The cavity size was normal.  Systolic function was    normal. The estimated ejection fraction was in the range of 55%    to 60%. Wall motion was normal; there were no regional wall    motion abnormalities. Left ventricular diastolic function    parameters were normal.  - Left atrium: The atrium was normal in size.  - Right ventricle: Systolic function was normal.  - Pulmonary arteries: Systolic pressure was within the normal    range.   Past Medical History:  Diagnosis Date   Anxiety     Atrial fibrillation    Complication of anesthesia    difficulty waking up   COPD (chronic obstructive pulmonary disease)    Depression    Dysrhythmia    A-fib   Flushing 09/06/2015   Headache    Hypercholesteremia    Hypertension    Palpitation 09/06/2015   Rectus sheath hematoma, initial encounter 02/04/2020    Past Surgical History:  Procedure Laterality Date   HERNIA REPAIR     TONSILLECTOMY     TUBAL LIGATION      MEDICATIONS: No current facility-administered medications for this encounter.    acetaminophen (TYLENOL) 500 MG tablet   amiodarone (PACERONE) 200 MG tablet   busPIRone (BUSPAR) 5 MG tablet   citalopram (CELEXA) 40 MG tablet   Cyanocobalamin (B-12) 1000 MCG SUBL   ferrous sulfate 324 MG TBEC   losartan (COZAAR) 25 MG tablet   metoprolol succinate (TOPROL-XL) 25 MG 24 hr tablet   oxyCODONE-acetaminophen (PERCOCET/ROXICET) 5-325 MG tablet   rosuvastatin (CRESTOR) 40 MG tablet   Vitamin D, Ergocalciferol, (DRISDOL) 1.25 MG (50000 UNIT) CAPS capsule   warfarin (COUMADIN) 2 MG tablet    Myra Gianotti, PA-C Surgical Short Stay/Anesthesiology [facility] Phone [phone] [facility] Phone [phone] 04/19/2022 11:39 AM

## 2022-04-19 NOTE — Telephone Encounter (Signed)
        Patient  visited [facility] on 04/08/2022  for treatment    Telephone encounter attempt :  2nd  No Answer   [facility] [phone] [address] , [place] [address] Email : [email or web address]

## 2022-04-20 ENCOUNTER — Encounter (HOSPITAL_COMMUNITY)
Admission: RE | Disposition: A | Discharge status: Home / Self Care | Payer: Self-pay | Source: Home / Self Care | Attending: Orthopedic Surgery

## 2022-04-20 ENCOUNTER — Other Ambulatory Visit: Payer: Self-pay

## 2022-04-20 ENCOUNTER — Observation Stay (HOSPITAL_COMMUNITY)
Admission: RE | Admit: 2022-04-20 | Discharge: 2022-04-21 | Disposition: A | Discharge status: Home / Self Care | Payer: Medicare PPO | Attending: Orthopedic Surgery | Admitting: Orthopedic Surgery

## 2022-04-20 ENCOUNTER — Encounter (HOSPITAL_COMMUNITY): Payer: Self-pay | Admitting: Orthopedic Surgery

## 2022-04-20 ENCOUNTER — Ambulatory Visit (HOSPITAL_COMMUNITY): Payer: Medicare PPO | Admitting: Vascular Surgery

## 2022-04-20 ENCOUNTER — Ambulatory Visit (HOSPITAL_COMMUNITY): Payer: Medicare PPO

## 2022-04-20 ENCOUNTER — Ambulatory Visit (HOSPITAL_BASED_OUTPATIENT_CLINIC_OR_DEPARTMENT_OTHER): Payer: Medicare PPO | Admitting: Vascular Surgery

## 2022-04-20 ENCOUNTER — Observation Stay (HOSPITAL_COMMUNITY): Payer: Medicare PPO

## 2022-04-20 DIAGNOSIS — Z79899 Other long term (current) drug therapy: Secondary | ICD-10-CM | POA: Diagnosis not present

## 2022-04-20 DIAGNOSIS — G8918 Other acute postprocedural pain: Secondary | ICD-10-CM | POA: Diagnosis not present

## 2022-04-20 DIAGNOSIS — F17200 Nicotine dependence, unspecified, uncomplicated: Secondary | ICD-10-CM | POA: Diagnosis not present

## 2022-04-20 DIAGNOSIS — S42201A Unspecified fracture of upper end of right humerus, initial encounter for closed fracture: Secondary | ICD-10-CM | POA: Diagnosis not present

## 2022-04-20 DIAGNOSIS — Z01818 Encounter for other preprocedural examination: Secondary | ICD-10-CM

## 2022-04-20 DIAGNOSIS — F17210 Nicotine dependence, cigarettes, uncomplicated: Secondary | ICD-10-CM | POA: Diagnosis not present

## 2022-04-20 DIAGNOSIS — I10 Essential (primary) hypertension: Secondary | ICD-10-CM

## 2022-04-20 DIAGNOSIS — J449 Chronic obstructive pulmonary disease, unspecified: Secondary | ICD-10-CM | POA: Diagnosis not present

## 2022-04-20 DIAGNOSIS — S42209A Unspecified fracture of upper end of unspecified humerus, initial encounter for closed fracture: Secondary | ICD-10-CM | POA: Diagnosis present

## 2022-04-20 DIAGNOSIS — I4891 Unspecified atrial fibrillation: Secondary | ICD-10-CM | POA: Diagnosis not present

## 2022-04-20 DIAGNOSIS — S42291A Other displaced fracture of upper end of right humerus, initial encounter for closed fracture: Secondary | ICD-10-CM

## 2022-04-20 DIAGNOSIS — W19XXXA Unspecified fall, initial encounter: Secondary | ICD-10-CM | POA: Insufficient documentation

## 2022-04-20 HISTORY — DX: Chronic obstructive pulmonary disease, unspecified: J44.9

## 2022-04-20 HISTORY — DX: Cardiac arrhythmia, unspecified: I49.9

## 2022-04-20 HISTORY — PX: ORIF HUMERUS FRACTURE: SHX2126

## 2022-04-20 HISTORY — DX: Headache, unspecified: R51.9

## 2022-04-20 HISTORY — DX: Other complications of anesthesia, initial encounter: T88.59XA

## 2022-04-20 LAB — PROTIME-INR
INR: 1 (ref 0.8–1.2)
Prothrombin Time: 13.2 seconds (ref 11.4–15.2)

## 2022-04-20 SURGERY — OPEN REDUCTION INTERNAL FIXATION (ORIF) PROXIMAL HUMERUS FRACTURE
Anesthesia: Regional | Site: Shoulder | Laterality: Right

## 2022-04-20 MED ORDER — EPHEDRINE 5 MG/ML INJ
INTRAVENOUS | Status: AC
  Filled 2022-04-20: qty 10

## 2022-04-20 MED ORDER — EPHEDRINE SULFATE-NACL 50-0.9 MG/10ML-% IV SOSY
PREFILLED_SYRINGE | INTRAVENOUS | Status: DC | PRN
  Administered 2022-04-20: 10 mg via INTRAVENOUS
  Administered 2022-04-20 (×4): 5 mg via INTRAVENOUS
  Administered 2022-04-20 (×2): 10 mg via INTRAVENOUS

## 2022-04-20 MED ORDER — METHOCARBAMOL 1000 MG/10ML IJ SOLN: 500.0000 mg | Freq: Four times a day (QID) | INTRAVENOUS | Status: DC | PRN

## 2022-04-20 MED ORDER — VANCOMYCIN HCL 1000 MG IV SOLR
INTRAVENOUS | Status: AC
  Filled 2022-04-20: qty 20

## 2022-04-20 MED ORDER — ONDANSETRON HCL 4 MG PO TABS: 4.0000 mg | ORAL_TABLET | Freq: Four times a day (QID) | ORAL | Status: DC | PRN

## 2022-04-20 MED ORDER — PROPOFOL 10 MG/ML IV BOLUS
INTRAVENOUS | Status: AC
  Filled 2022-04-20: qty 20

## 2022-04-20 MED ORDER — IRRISEPT - 450ML BOTTLE WITH 0.05% CHG IN STERILE WATER, USP 99.95% OPTIME
TOPICAL | Status: DC | PRN
  Administered 2022-04-20: 450 mL

## 2022-04-20 MED ORDER — HYDROMORPHONE HCL 1 MG/ML IJ SOLN: 0.5000 mg | INTRAMUSCULAR | Status: DC | PRN

## 2022-04-20 MED ORDER — ACETAMINOPHEN 10 MG/ML IV SOLN
1000.0000 mg | Freq: Once | INTRAVENOUS | Status: DC | PRN
  Administered 2022-04-20: 1000 mg via INTRAVENOUS

## 2022-04-20 MED ORDER — OXYCODONE HCL 5 MG PO TABS
5.0000 mg | ORAL_TABLET | ORAL | Status: DC | PRN
  Administered 2022-04-21: 5 mg via ORAL
  Filled 2022-04-20: qty 1

## 2022-04-20 MED ORDER — PHENYLEPHRINE HCL-NACL 20-0.9 MG/250ML-% IV SOLN
INTRAVENOUS | Status: DC | PRN
  Administered 2022-04-20: 15 ug/min via INTRAVENOUS

## 2022-04-20 MED ORDER — FENTANYL CITRATE (PF) 100 MCG/2ML IJ SOLN
INTRAMUSCULAR | Status: AC
  Filled 2022-04-20: qty 2

## 2022-04-20 MED ORDER — WARFARIN - PHYSICIAN DOSING INPATIENT: Freq: Every day | Status: DC

## 2022-04-20 MED ORDER — DOCUSATE SODIUM 100 MG PO CAPS
100.0000 mg | ORAL_CAPSULE | Freq: Two times a day (BID) | ORAL | Status: DC
  Administered 2022-04-20 – 2022-04-21 (×2): 100 mg via ORAL
  Filled 2022-04-20 (×2): qty 1

## 2022-04-20 MED ORDER — 0.9 % SODIUM CHLORIDE (POUR BTL) OPTIME
TOPICAL | Status: DC | PRN
  Administered 2022-04-20 (×3): 1000 mL

## 2022-04-20 MED ORDER — ONDANSETRON HCL 4 MG/2ML IJ SOLN
INTRAMUSCULAR | Status: DC | PRN
  Administered 2022-04-20: 4 mg via INTRAVENOUS

## 2022-04-20 MED ORDER — VITAMIN D (ERGOCALCIFEROL) 1.25 MG (50000 UNIT) PO CAPS
50000.0000 [IU] | ORAL_CAPSULE | ORAL | Status: DC
  Administered 2022-04-20: 50000 [IU] via ORAL
  Filled 2022-04-20 (×2): qty 1

## 2022-04-20 MED ORDER — MIDAZOLAM HCL 2 MG/2ML IJ SOLN
INTRAMUSCULAR | Status: DC | PRN
  Administered 2022-04-20: 1 mg via INTRAVENOUS

## 2022-04-20 MED ORDER — DEXAMETHASONE SODIUM PHOSPHATE 10 MG/ML IJ SOLN
INTRAMUSCULAR | Status: DC | PRN
  Administered 2022-04-20: 5 mg via INTRAVENOUS

## 2022-04-20 MED ORDER — ONDANSETRON HCL 4 MG/2ML IJ SOLN: 4.0000 mg | Freq: Four times a day (QID) | INTRAMUSCULAR | Status: DC | PRN

## 2022-04-20 MED ORDER — FENTANYL CITRATE (PF) 100 MCG/2ML IJ SOLN
25.0000 ug | INTRAMUSCULAR | Status: DC | PRN
  Administered 2022-04-20: 25 ug via INTRAVENOUS

## 2022-04-20 MED ORDER — DEXAMETHASONE SODIUM PHOSPHATE 10 MG/ML IJ SOLN
INTRAMUSCULAR | Status: AC
  Filled 2022-04-20: qty 1

## 2022-04-20 MED ORDER — METHOCARBAMOL 500 MG PO TABS: 500.0000 mg | ORAL_TABLET | Freq: Four times a day (QID) | ORAL | Status: DC | PRN

## 2022-04-20 MED ORDER — LIDOCAINE 2% (20 MG/ML) 5 ML SYRINGE
INTRAMUSCULAR | Status: DC | PRN
  Administered 2022-04-20: 60 mg via INTRAVENOUS

## 2022-04-20 MED ORDER — ACETAMINOPHEN 500 MG PO TABS
1000.0000 mg | ORAL_TABLET | Freq: Four times a day (QID) | ORAL | Status: DC
  Administered 2022-04-20 – 2022-04-21 (×3): 1000 mg via ORAL
  Filled 2022-04-20 (×3): qty 2

## 2022-04-20 MED ORDER — SUGAMMADEX SODIUM 200 MG/2ML IV SOLN
INTRAVENOUS | Status: DC | PRN
  Administered 2022-04-20: 100 mg via INTRAVENOUS

## 2022-04-20 MED ORDER — BUPIVACAINE HCL (PF) 0.25 % IJ SOLN
INTRAMUSCULAR | Status: AC
  Filled 2022-04-20: qty 30

## 2022-04-20 MED ORDER — ONDANSETRON HCL 4 MG/2ML IJ SOLN: 4.0000 mg | Freq: Once | INTRAMUSCULAR | Status: DC | PRN

## 2022-04-20 MED ORDER — CEFAZOLIN SODIUM-DEXTROSE 2-4 GM/100ML-% IV SOLN
2.0000 g | Freq: Three times a day (TID) | INTRAVENOUS | Status: AC
  Administered 2022-04-20 – 2022-04-21 (×3): 2 g via INTRAVENOUS
  Filled 2022-04-20 (×3): qty 100

## 2022-04-20 MED ORDER — METOCLOPRAMIDE HCL 5 MG PO TABS: 5.0000 mg | ORAL_TABLET | Freq: Three times a day (TID) | ORAL | Status: DC | PRN

## 2022-04-20 MED ORDER — LOSARTAN POTASSIUM 25 MG PO TABS
25.0000 mg | ORAL_TABLET | Freq: Every day | ORAL | Status: DC
  Filled 2022-04-20 (×2): qty 1

## 2022-04-20 MED ORDER — PHENYLEPHRINE HCL (PRESSORS) 10 MG/ML IV SOLN
INTRAVENOUS | Status: DC | PRN
  Administered 2022-04-20 (×10): 40 ug via INTRAVENOUS

## 2022-04-20 MED ORDER — METOPROLOL SUCCINATE ER 25 MG PO TB24
25.0000 mg | ORAL_TABLET | Freq: Every day | ORAL | Status: DC
  Administered 2022-04-21: 25 mg via ORAL
  Filled 2022-04-20: qty 1

## 2022-04-20 MED ORDER — ACETAMINOPHEN 10 MG/ML IV SOLN
INTRAVENOUS | Status: AC
  Filled 2022-04-20: qty 100

## 2022-04-20 MED ORDER — GLYCOPYRROLATE PF 0.2 MG/ML IJ SOSY
PREFILLED_SYRINGE | INTRAMUSCULAR | Status: DC | PRN
  Administered 2022-04-20 (×2): .1 mg via INTRAVENOUS

## 2022-04-20 MED ORDER — POVIDONE-IODINE 7.5 % EX SOLN: Freq: Once | CUTANEOUS | Status: DC

## 2022-04-20 MED ORDER — BUPIVACAINE HCL (PF) 0.5 % IJ SOLN
INTRAMUSCULAR | Status: DC | PRN
  Administered 2022-04-20: 12 mL via PERINEURAL

## 2022-04-20 MED ORDER — SODIUM CHLORIDE 0.9 % IV SOLN: INTRAVENOUS | Status: DC

## 2022-04-20 MED ORDER — BUPIVACAINE HCL (PF) 0.25 % IJ SOLN
INTRAMUSCULAR | Status: DC | PRN
  Administered 2022-04-20: 3 mL

## 2022-04-20 MED ORDER — MENTHOL 3 MG MT LOZG: 1.0000 | LOZENGE | OROMUCOSAL | Status: DC | PRN

## 2022-04-20 MED ORDER — FENTANYL CITRATE (PF) 100 MCG/2ML IJ SOLN: 25.0000 ug | Freq: Once | INTRAMUSCULAR | Status: AC

## 2022-04-20 MED ORDER — MIDAZOLAM HCL 2 MG/2ML IJ SOLN
INTRAMUSCULAR | Status: AC
  Filled 2022-04-20: qty 2

## 2022-04-20 MED ORDER — BUSPIRONE HCL 5 MG PO TABS
5.0000 mg | ORAL_TABLET | Freq: Every day | ORAL | Status: DC
  Administered 2022-04-21: 5 mg via ORAL
  Filled 2022-04-20: qty 1

## 2022-04-20 MED ORDER — FENTANYL CITRATE (PF) 250 MCG/5ML IJ SOLN
INTRAMUSCULAR | Status: DC | PRN
  Administered 2022-04-20: 50 ug via INTRAVENOUS

## 2022-04-20 MED ORDER — BUPIVACAINE LIPOSOME 1.3 % IJ SUSP
INTRAMUSCULAR | Status: AC
  Filled 2022-04-20: qty 10

## 2022-04-20 MED ORDER — FENTANYL CITRATE (PF) 250 MCG/5ML IJ SOLN
INTRAMUSCULAR | Status: AC
  Filled 2022-04-20: qty 5

## 2022-04-20 MED ORDER — FENTANYL CITRATE (PF) 100 MCG/2ML IJ SOLN
INTRAMUSCULAR | Status: AC
  Administered 2022-04-20: 25 ug via INTRAVENOUS
  Filled 2022-04-20: qty 2

## 2022-04-20 MED ORDER — ACETAMINOPHEN 325 MG PO TABS: 325.0000 mg | ORAL_TABLET | Freq: Four times a day (QID) | ORAL | Status: DC | PRN

## 2022-04-20 MED ORDER — METOCLOPRAMIDE HCL 5 MG/ML IJ SOLN: 5.0000 mg | Freq: Three times a day (TID) | INTRAMUSCULAR | Status: DC | PRN

## 2022-04-20 MED ORDER — WARFARIN SODIUM 2 MG PO TABS
2.0000 mg | ORAL_TABLET | Freq: Every day | ORAL | Status: DC
  Administered 2022-04-20: 2 mg via ORAL
  Filled 2022-04-20: qty 1

## 2022-04-20 MED ORDER — CEFAZOLIN SODIUM-DEXTROSE 2-4 GM/100ML-% IV SOLN
2.0000 g | INTRAVENOUS | Status: AC
  Administered 2022-04-20: 2 g via INTRAVENOUS
  Filled 2022-04-20: qty 100

## 2022-04-20 MED ORDER — ANTICOAGULANT SODIUM CITRATE 4% (200MG/5ML) IV SOLN
Status: AC | PRN
  Administered 2022-04-20: 30 mL

## 2022-04-20 MED ORDER — TRANEXAMIC ACID-NACL 1000-0.7 MG/100ML-% IV SOLN
1000.0000 mg | INTRAVENOUS | Status: DC
  Filled 2022-04-20: qty 100

## 2022-04-20 MED ORDER — ROCURONIUM BROMIDE 10 MG/ML (PF) SYRINGE
PREFILLED_SYRINGE | INTRAVENOUS | Status: AC
  Filled 2022-04-20: qty 10

## 2022-04-20 MED ORDER — POVIDONE-IODINE 10 % EX SWAB
2.0000 | Freq: Once | CUTANEOUS | Status: AC
  Administered 2022-04-20: 2 via TOPICAL

## 2022-04-20 MED ORDER — LACTATED RINGERS IV SOLN: INTRAVENOUS | Status: DC

## 2022-04-20 MED ORDER — CHLORHEXIDINE GLUCONATE 0.12 % MT SOLN
15.0000 mL | Freq: Once | OROMUCOSAL | Status: AC
  Administered 2022-04-20: 15 mL via OROMUCOSAL
  Filled 2022-04-20: qty 15

## 2022-04-20 MED ORDER — LIDOCAINE 2% (20 MG/ML) 5 ML SYRINGE
INTRAMUSCULAR | Status: AC
  Filled 2022-04-20: qty 5

## 2022-04-20 MED ORDER — ORAL CARE MOUTH RINSE: 15.0000 mL | Freq: Once | OROMUCOSAL | Status: AC

## 2022-04-20 MED ORDER — ONDANSETRON HCL 4 MG/2ML IJ SOLN
INTRAMUSCULAR | Status: AC
  Filled 2022-04-20: qty 2

## 2022-04-20 MED ORDER — ROCURONIUM BROMIDE 10 MG/ML (PF) SYRINGE
PREFILLED_SYRINGE | INTRAVENOUS | Status: DC | PRN
  Administered 2022-04-20: 40 mg via INTRAVENOUS

## 2022-04-20 MED ORDER — BUPIVACAINE LIPOSOME 1.3 % IJ SUSP
INTRAMUSCULAR | Status: DC | PRN
  Administered 2022-04-20: 10 mL via PERINEURAL

## 2022-04-20 MED ORDER — PROPOFOL 10 MG/ML IV BOLUS
INTRAVENOUS | Status: DC | PRN
  Administered 2022-04-20: 100 mg via INTRAVENOUS

## 2022-04-20 MED ORDER — AMIODARONE HCL 200 MG PO TABS
200.0000 mg | ORAL_TABLET | Freq: Every day | ORAL | Status: DC
  Administered 2022-04-21: 200 mg via ORAL
  Filled 2022-04-20: qty 1

## 2022-04-20 MED ORDER — PHENOL 1.4 % MT LIQD: 1.0000 | OROMUCOSAL | Status: DC | PRN

## 2022-04-20 SURGICAL SUPPLY — 78 items
ALCOHOL 70% 16 OZ (MISCELLANEOUS) ×2 IMPLANT
APL PRP STRL LF DISP 70% ISPRP (MISCELLANEOUS) ×2
APL SKNCLS STERI-STRIP NONHPOA (GAUZE/BANDAGES/DRESSINGS) ×1
BAG COUNTER SPONGE SURGICOUNT (BAG) ×2 IMPLANT
BAG SPNG CNTER NS LX DISP (BAG) ×1
BENZOIN TINCTURE PRP APPL 2/3 (GAUZE/BANDAGES/DRESSINGS) ×2 IMPLANT
BIT DRILL 3.2 (BIT) ×1
BIT DRILL 3.2XCALB NS DISP (BIT) IMPLANT
BIT DRILL CALIBRATED 2.7 (BIT) IMPLANT
BIT DRL 3.2XCALB NS DISP (BIT) ×1
BNDG COHESIVE 4X5 TAN STRL (GAUZE/BANDAGES/DRESSINGS) ×2 IMPLANT
BNDG ELASTIC 4X5.8 VLCR STR LF (GAUZE/BANDAGES/DRESSINGS) IMPLANT
BNDG ELASTIC 6X5.8 VLCR STR LF (GAUZE/BANDAGES/DRESSINGS) IMPLANT
CHLORAPREP W/TINT 26 (MISCELLANEOUS) ×4 IMPLANT
CONT SPEC 4OZ CLIKSEAL STRL BL (MISCELLANEOUS) IMPLANT
COVER SURGICAL LIGHT HANDLE (MISCELLANEOUS) ×2 IMPLANT
DRAIN PENROSE .5X12 LATEX STL (DRAIN) IMPLANT
DRAPE C-ARM 42X72 X-RAY (DRAPES) IMPLANT
DRAPE IMP U-DRAPE 54X76 (DRAPES) ×2 IMPLANT
DRAPE U-SHAPE 47X51 STRL (DRAPES) ×4 IMPLANT
DRESSING MEPILEX FLEX 4X4 (GAUZE/BANDAGES/DRESSINGS) IMPLANT
DRIVER HEX QC T15 (ORTHOPEDIC DISPOSABLE SUPPLIES) IMPLANT
DRSG AQUACEL AG ADV 3.5X10 (GAUZE/BANDAGES/DRESSINGS) IMPLANT
DRSG MEPILEX FLEX 4X4 (GAUZE/BANDAGES/DRESSINGS) ×1
ELECT REM PT RETURN 9FT ADLT (ELECTROSURGICAL) ×1
ELECTRODE REM PT RTRN 9FT ADLT (ELECTROSURGICAL) ×2 IMPLANT
FACESHIELD WRAPAROUND (MASK) ×1 IMPLANT
FACESHIELD WRAPAROUND OR TEAM (MASK) ×2 IMPLANT
FIBER STAGRAFT 5CC (Tissue) IMPLANT
GAUZE PAD ABD 8X10 STRL (GAUZE/BANDAGES/DRESSINGS) IMPLANT
GAUZE SPONGE 4X4 12PLY STRL (GAUZE/BANDAGES/DRESSINGS) ×4 IMPLANT
GAUZE XEROFORM 5X9 LF (GAUZE/BANDAGES/DRESSINGS) IMPLANT
GLOVE BIOGEL PI IND STRL 8 (GLOVE) ×2 IMPLANT
GLOVE ECLIPSE 8.0 STRL XLNG CF (GLOVE) ×2 IMPLANT
GOWN STRL REUS W/ TWL LRG LVL3 (GOWN DISPOSABLE) ×4 IMPLANT
GOWN STRL REUS W/ TWL XL LVL3 (GOWN DISPOSABLE) ×2 IMPLANT
GOWN STRL REUS W/TWL LRG LVL3 (GOWN DISPOSABLE) ×2
GOWN STRL REUS W/TWL XL LVL3 (GOWN DISPOSABLE) ×1
HYDROGEN PEROXIDE 16OZ (MISCELLANEOUS) ×2 IMPLANT
K-WIRE 2X5 SS THRDED S3 (WIRE) ×4
KIT BASIN OR (CUSTOM PROCEDURE TRAY) ×2 IMPLANT
KIT PLATELET STD 3200 60X6 (KITS) IMPLANT
KIT TURNOVER KIT B (KITS) ×2 IMPLANT
KWIRE 2X5 SS THRDED S3 (WIRE) IMPLANT
MANIFOLD NEPTUNE II (INSTRUMENTS) ×2 IMPLANT
NS IRRIG 1000ML POUR BTL (IV SOLUTION) ×2 IMPLANT
PACK SHOULDER (CUSTOM PROCEDURE TRAY) ×2 IMPLANT
PAD ARMBOARD 7.5X6 YLW CONV (MISCELLANEOUS) ×4 IMPLANT
PAD CAST 4YDX4 CTTN HI CHSV (CAST SUPPLIES) IMPLANT
PADDING CAST COTTON 4X4 STRL (CAST SUPPLIES)
PEG LOCKING 3.2X32 (Peg) IMPLANT
PEG LOCKING 3.2X36 (Screw) IMPLANT
PEG LOCKING 3.2X40 (Peg) IMPLANT
PEG LOCKING 3.2X42 (Screw) IMPLANT
PEG LOCKING 3.2X52 (Peg) IMPLANT
PLATE PROX HUM HI R 3H 80 (Plate) IMPLANT
SCREW LOCK CORT STAR 3.5X28 (Screw) IMPLANT
SCREW LOW PROF TIS 3.5X28MM (Screw) IMPLANT
SCREW LP NL T15 3.5X22 (Screw) IMPLANT
SCREW LP NL T15 3.5X24 (Screw) IMPLANT
SCREW PEG LOCK 3.2X30MM (Screw) IMPLANT
SLING ARM FOAM STRAP SML (SOFTGOODS) IMPLANT
SPONGE T-LAP 18X18 ~~LOC~~+RFID (SPONGE) ×2 IMPLANT
SPONGE T-LAP 4X18 ~~LOC~~+RFID (SPONGE) IMPLANT
STAPLER VISISTAT 35W (STAPLE) IMPLANT
STOCKINETTE IMPERVIOUS 9X36 MD (GAUZE/BANDAGES/DRESSINGS) IMPLANT
STRIP CLOSURE SKIN 1/2X4 (GAUZE/BANDAGES/DRESSINGS) IMPLANT
SUCTION FRAZIER HANDLE 10FR (MISCELLANEOUS) ×1
SUCTION TUBE FRAZIER 10FR DISP (MISCELLANEOUS) IMPLANT
SUT ETHILON 3 0 PS 1 (SUTURE) IMPLANT
SUT VIC AB 0 CT1 27 (SUTURE) ×2
SUT VIC AB 0 CT1 27XBRD ANBCTR (SUTURE) IMPLANT
SUT VIC AB 1 CT1 36 (SUTURE) IMPLANT
SUT VIC AB 2-0 CTB1 (SUTURE) IMPLANT
TOWEL GREEN STERILE (TOWEL DISPOSABLE) ×2 IMPLANT
TOWEL GREEN STERILE FF (TOWEL DISPOSABLE) ×2 IMPLANT
WATER STERILE IRR 1000ML POUR (IV SOLUTION) ×2 IMPLANT
YANKAUER SUCT BULB TIP NO VENT (SUCTIONS) IMPLANT

## 2022-04-20 NOTE — H&P (Signed)
Kristin Haas is an 67 y.o. female.   Chief Complaint: Right shoulder pain HPI:  Kristin Haas is a 67 y.o. female who presents  reporting right shoulder pain.  Patient sustained a right proximal humerus fracture about 12 days ago.  Had a mechanical fall and injured her shoulder.Denies any other orthopedic complaints.  Patient is a smoker.  Does take Coumadin as well for atrial fibrillation but has not been taking it since the 25th.  Radiographs are reviewed and it does show what appears to be a 2 or 3 part proximal humerus fracture with significant displacement of the shaft anterior relative to the head.  The head remains located.   Past Medical History:  Diagnosis Date   Anxiety    Atrial fibrillation    Complication of anesthesia    difficulty waking up   COPD (chronic obstructive pulmonary disease)    Depression    Dysrhythmia    A-fib   Flushing 09/06/2015   Headache    Hypercholesteremia    Hypertension    Palpitation 09/06/2015   Rectus sheath hematoma, initial encounter 02/04/2020    Past Surgical History:  Procedure Laterality Date   HERNIA REPAIR     TONSILLECTOMY     TUBAL LIGATION      Family History  Problem Relation Age of Onset   Hypertension Father    Lung cancer Father    Hypertension Sister    Uterine cancer Maternal Grandmother    Cervical cancer Paternal Grandmother    Lung cancer Son    Social History:  reports that she has been smoking cigarettes. She has a 25.00 pack-year smoking history. She has never used smokeless tobacco. She reports current alcohol use. She reports that she does not use drugs.  Allergies:  Allergies  Allergen Reactions   Codeine Nausea And Vomiting    Medications Prior to Admission  Medication Sig Dispense Refill   acetaminophen (TYLENOL) 500 MG tablet Take 500 mg by mouth every 6 (six) hours as needed for mild pain.     amiodarone (PACERONE) 200 MG tablet Take 1 tablet by mouth once daily 90 tablet 0   busPIRone  (BUSPAR) 5 MG tablet Take 5 mg by mouth daily.     citalopram (CELEXA) 40 MG tablet Take 1 tablet (40 mg total) by mouth daily. 90 tablet 1   Cyanocobalamin (B-12) 1000 MCG SUBL Place 1,000 mcg under the tongue once a week. B-12 Drops     ferrous sulfate 324 MG TBEC Take 324 mg by mouth once a week.     losartan (COZAAR) 25 MG tablet Take 1 tablet (25 mg total) by mouth daily. 30 tablet 11   metoprolol succinate (TOPROL-XL) 25 MG 24 hr tablet Take 25 mg by mouth daily.     oxyCODONE-acetaminophen (PERCOCET/ROXICET) 5-325 MG tablet Take 1 tablet by mouth every 6 (six) hours as needed for severe pain. 20 tablet 0   rosuvastatin (CRESTOR) 40 MG tablet Take 1 tablet (40 mg total) by mouth daily. (Patient taking differently: Take 40 mg by mouth every evening.) 90 tablet 3   Vitamin D, Ergocalciferol, (DRISDOL) 1.25 MG (50000 UNIT) CAPS capsule Take 50,000 Units by mouth once a week.     warfarin (COUMADIN) 2 MG tablet Take 1 tablet (2 mg total) by mouth daily. 30 tablet 3    Results for orders placed or performed during the hospital encounter of 04/20/22 (from the past 48 hour(s))  Protime-INR     Status: None  Collection Time: 04/20/22  8:49 AM  Result Value Ref Range   Prothrombin Time 13.2 11.4 - 15.2 seconds   INR 1.0 0.8 - 1.2    Comment: (NOTE) INR goal varies based on device and disease states. Performed at [facility], [place] [address], [place], [place] [address]    CT SHOULDER RIGHT WO CONTRAST  Result Date: 04/18/2022 CLINICAL DATA:  Right shoulder pain after fall on 04/08/2022 EXAM: CT OF THE UPPER RIGHT EXTREMITY WITHOUT CONTRAST TECHNIQUE: Multidetector CT imaging of the upper right extremity was performed according to the standard protocol. RADIATION DOSE REDUCTION: This exam was performed according to the departmental dose-optimization program which includes automated exposure control, adjustment of the mA and/or kV according to patient size and/or use of iterative  reconstruction technique. COMPARISON:  X-ray 04/08/2022 FINDINGS: Bones/Joint/Cartilage Subacute fracture of the right humeral head and neck with transversely oriented component through the surgical neck which is moderately impacted and demonstrates varus angulation. Nondisplaced fracture extending to the proximal metaphysis along the medial cortex. Nondisplaced mildly comminuted fracture of the greater tuberosity. No fracture of the lesser tuberosity or humeral head articular surface. Glenohumeral joint alignment is maintained without dislocation. Intact AC joint with mild osteoarthritis. Remaining visualized osseous structures are otherwise intact and unremarkable. No lytic or sclerotic bone lesion. Ligaments Suboptimally assessed by CT. Muscles and Tendons No rotator cuff muscle atrophy or fatty infiltration. Rotator cuff tendons are not well assessed by CT. Soft tissues No organized fluid collection or hematoma. No right axillary lymphadenopathy. Included portion of the right lung field is clear. IMPRESSION: Subacute fracture of the right humeral head and neck, as described. Electronically Signed   By: Davina Poke D.O.   On: 04/18/2022 15:00    Review of Systems  Musculoskeletal:  Positive for arthralgias.  All other systems reviewed and are negative.   Blood pressure (!) 168/77, pulse (!) 54, temperature 98.1 F (36.7 C), temperature source Oral, resp. rate 17, height 5\' 6"  (1.676 m), weight 55.3 kg, SpO2 97 %. Physical Exam Vitals reviewed.  HENT:     Head: Normocephalic.     Nose: Nose normal.     Mouth/Throat:     Mouth: Mucous membranes are moist.  Eyes:     Pupils: Pupils are equal, round, and reactive to light.  Cardiovascular:     Rate and Rhythm: Normal rate.     Pulses: Normal pulses.  Pulmonary:     Effort: Pulmonary effort is normal.  Abdominal:     General: Abdomen is flat.  Musculoskeletal:     Cervical back: Normal range of motion.  Skin:    General: Skin is warm.      Capillary Refill: Capillary refill takes less than 2 seconds.  Neurological:     General: No focal deficit present.     Mental Status: She is alert.  Psychiatric:        Mood and Affect: Mood normal.    Ortho exam demonstrates ecchymosis and bruising in the right upper extremity. Shaft is palpable anteriorly. Motor or sensory function hand is intact. Radial pulses intact. Deltoid does fire. Elbow nontender.   Assessment/Plan Impression is right proximal humerus fracture.  Patient is a smoker.  Has high risk for nonunion or malunion.  Operative treatment indicated due to the amount of displacement.  CT scanning has been ordered and is available at the time of this dictation and it does show predominantly a 2 part fracture which should be amenable to reduction and  plate fixation.  Would also like to do B MAC with bone grafting to Boost the biological component of the healing.  She is at high risk of fracture nonunion and possibly avascular necrosis.  This would require hardware removal and reverse shoulder replacement.  The risk and benefits of the procedure are discussed with the patient including not limited to infection or vessel damage shoulder stiffness as well as potential need for more surgery.  Expected nature of the rehabilitative process is also discussed.  All questions answered.    Anderson Malta, MD 04/20/2022, 9:23 AM

## 2022-04-20 NOTE — Anesthesia Postprocedure Evaluation (Signed)
Anesthesia Post Note  Patient: Kristin Haas  Procedure(s) Performed: OPEN REDUCTION INTERNAL  FIXATION (ORIF) RIGHT PROXIMAL HUMERUS FRACTURE & BONE MARROW ASPIRATE FROM ILIAC CREST (Right: Shoulder)     Patient location during evaluation: PACU Anesthesia Type: Regional and General Level of consciousness: awake and alert Pain management: pain level controlled Vital Signs Assessment: post-procedure vital signs reviewed and stable Respiratory status: spontaneous breathing, nonlabored ventilation, respiratory function stable and patient connected to nasal cannula oxygen Cardiovascular status: blood pressure returned to baseline and stable Postop Assessment: no apparent nausea or vomiting Anesthetic complications: no  No notable events documented.  Last Vitals:  Vitals:   04/20/22 1530 04/20/22 1557  BP: (!) 110/55 (!) 107/59  Pulse: (!) 48 (!) 51  Resp: 11   Temp: 36.6 C 36.5 C  SpO2: 95% 99%    Last Pain:  Vitals:   04/20/22 1628  TempSrc:   PainSc: 0-No pain   Pain Goal: Patients Stated Pain Goal: 3 (04/20/22 0845)                 Barnet Glasgow

## 2022-04-20 NOTE — Op Note (Signed)
NAMELAYNA, Haas MEDICAL RECORD NO: [id number] ACCOUNT NO: [id number] DATE OF BIRTH: 1955/08/26 FACILITY: [facility] LOCATION: [facility] PHYSICIAN: Yetta Barre. Marlou Sa, MD  Operative Report   DATE OF PROCEDURE: 04/20/2022  PREOPERATIVE DIAGNOSIS:  Right shoulder proximal humerus fracture.  POSTOPERATIVE DIAGNOSIS:  Right shoulder proximal humerus fracture.  PROCEDURE:  Right shoulder proximal humerus fracture open reduction and internal fixation using Biomet plate and BMAC bone grafting.  SURGEON:  Yetta Barre. Marlou Sa, MD  ASSISTANT:  Annie Main, PA.  INDICATIONS:  This is a 67 year old patient with right proximal humerus pain following a fall.  She is 2 weeks out from her injury.  She presents now for operative management after explanation of risks and benefits.  DESCRIPTION OF PROCEDURE:  The patient was brought to the operating room where general anesthetic was induced.  Preoperative antibiotics administered.  Timeout was called.  Right iliac crest region as well as shoulder, arm and hand prescrubbed with  alcohol and Betadine, allowed to air dry, prepped with DuraPrep solution and draped in sterile manner.  Ioban used to cover the operative field.  Timeout was called about 3 fingerbreadths proximal to the anterior superior iliac crest, a small 4 mm  incision was made using a Jamshidi needle, the iliac crest bone marrow was aspirated between the inner and outer table and that was spun down on the back table.  This incision was irrigated and closed using 3-0 nylon.  Anesthetic agent x4 mL utilized  around this incision.  Next, attention directed towards the proximal humerus.  Deltopectoral approach was made over about 9 cm incision.  Skin and subcutaneous tissue were sharply divided.  Cephalic vein mobilized laterally.  It did require ligation due  to branch avulsion.  The deltoid was elevated off its anterior attachment manually.  Axillary nerve was palpated and protected at all times during the  case.  A Kolbel retractor was placed.  The subacromial and subdeltoid adhesions were released manually.   Fracture was identified.  Minimal soft tissue dissection was performed.  Rotator cuff was intact.  The fracture was reduced and a plate was applied to the lateral aspect of the humerus and metaphysis.  K-wires were placed.  Good reduction was achieved.   Length restored for the most part, but the head was taken out of varus.  A combination of screws and pegs were placed with a good reduction achieved.  Correct placement of the screws and pegs was confirmed in the AP and lateral planes under  fluoroscopy.  Next, thorough irrigation was performed with 3 liters of irrigating solution. The bone graft was then placed into the head shaft interface through a small drill hole on the lateral aspect of the plate.  The fracture reduction was very good.   We were able to put all the bone graft and bone marrow aspirate into the humeral head through this region.  At this time, pouring irrigation with IrriSept was utilized.  Vancomycin powder was then placed on top of the plate.  The deltopectoral interval  was then closed using #1 Vicryl suture followed by interrupted inverted 0 Vicryl suture, 2-0 Vicryl suture, and 3-0 Monocryl with Steri-Strips, Aquacel dressing, and shoulder immobilizer placed.  Impervious dressing placed over the hip as well.  The  patient tolerated the procedure well without immediate complication and transferred to the recovery room in stable condition.  Luke's assistance was required at all times for retraction, opening, closing, mobilization of tissue.  His assistance was a  medical necessity.  PUS D: 04/20/2022 12:45:58 pm T: 04/20/2022 2:38:00 pm  JOB: [id number]

## 2022-04-20 NOTE — Transfer of Care (Signed)
Immediate Anesthesia Transfer of Care Note  Patient: Kristin Haas  Procedure(s) Performed: OPEN REDUCTION INTERNAL  FIXATION (ORIF) RIGHT PROXIMAL HUMERUS FRACTURE & BONE MARROW ASPIRATE FROM ILIAC CREST (Right: Shoulder)  Patient Location: PACU  Anesthesia Type:GA combined with regional for post-op pain  Level of Consciousness: alert , oriented, patient cooperative, and responds to stimulation  Airway & Oxygen Therapy: Patient Spontanous Breathing and Patient connected to nasal cannula oxygen  Post-op Assessment: Report given to RN and Post -op Vital signs reviewed and stable  Post vital signs: Reviewed and stable  Last Vitals:  Vitals Value Taken Time  BP 114/53 04/20/22 1351  Temp    Pulse 53 04/20/22 1357  Resp 12 04/20/22 1357  SpO2 98 % 04/20/22 1357  Vitals shown include unvalidated device data.  Last Pain:  Vitals:   04/20/22 0959  TempSrc:   PainSc: 0-No pain      Patients Stated Pain Goal: 3 ([id number])  Complications: No notable events documented.

## 2022-04-20 NOTE — Brief Op Note (Signed)
   04/20/2022  12:41 PM  PATIENT:  Kristin Haas  67 y.o. female  PRE-OPERATIVE DIAGNOSIS:  right shoulder fracture  POST-OPERATIVE DIAGNOSIS:  right shoulder fracture  PROCEDURE:  Procedure(s): OPEN REDUCTION INTERNAL  FIXATION (ORIF) RIGHT PROXIMAL HUMERUS FRACTURE & BONE MARROW ASPIRATE FROM ILIAC CREST  SURGEON:  Surgeon(s): Meredith Pel, MD  ASSISTANT: magnant pa  ANESTHESIA:   general  EBL: 50 ml    Total I/O In: 1000 [I.V.:900; IV Piggyback:100] Out: -   BLOOD ADMINISTERED: none  DRAINS: none   LOCAL MEDICATIONS USED:  vanco  SPECIMEN:  No Specimen  COUNTS:  YES  TOURNIQUET:  * No tourniquets in log *  DICTATION: .Other Dictation: Dictation Number [id number]  PLAN OF CARE: Discharge to home after PACU  PATIENT DISPOSITION:  PACU - hemodynamically stable

## 2022-04-20 NOTE — Anesthesia Procedure Notes (Signed)
Anesthesia Regional Block: Interscalene brachial plexus block   Pre-Anesthetic Checklist: , timeout performed,  Correct Patient, Correct Site, Correct Laterality,  Correct Procedure, Correct Position, site marked,  Risks and benefits discussed,  Surgical consent,  Pre-op evaluation,  At surgeon's request and post-op pain management  Laterality: Upper and Right  Prep: Maximum Sterile Barrier Precautions used, chloraprep       Needles:  Injection technique: Single-shot  Needle Type: Echogenic Needle     Needle Length: 5cm  Needle Gauge: 21     Additional Needles:   Procedures:,,,, ultrasound used (permanent image in chart),,    Narrative:  Start time: 04/20/2022 9:42 AM End time: 04/20/2022 9:52 AM Injection made incrementally with aspirations every 5 mL.  Performed by: Personally  Anesthesiologist: Barnet Glasgow, MD  Additional Notes: Block assessed prior to procedure. Patient tolerated procedure well.

## 2022-04-20 NOTE — Anesthesia Procedure Notes (Signed)
Procedure Name: Intubation Date/Time: 04/20/2022 10:29 AM  Performed by: Michele Rockers, CRNAPre-anesthesia Checklist: Patient identified, Patient being monitored, Timeout performed, Emergency Drugs available and Suction available Patient Re-evaluated:Patient Re-evaluated prior to induction Oxygen Delivery Method: Circle System Utilized Preoxygenation: Pre-oxygenation with 100% oxygen Induction Type: IV induction Ventilation: Mask ventilation without difficulty Laryngoscope Size: Miller and 2 Grade View: Grade I Tube type: Oral Tube size: 7.0 mm Number of attempts: 1 Airway Equipment and Method: Stylet Placement Confirmation: ETT inserted through vocal cords under direct vision, positive ETCO2 and breath sounds checked- equal and bilateral Secured at: 21 cm Tube secured with: Tape Dental Injury: Teeth and Oropharynx as per pre-operative assessment

## 2022-04-21 DIAGNOSIS — I10 Essential (primary) hypertension: Secondary | ICD-10-CM | POA: Diagnosis not present

## 2022-04-21 DIAGNOSIS — I4891 Unspecified atrial fibrillation: Secondary | ICD-10-CM | POA: Diagnosis not present

## 2022-04-21 DIAGNOSIS — F17210 Nicotine dependence, cigarettes, uncomplicated: Secondary | ICD-10-CM | POA: Diagnosis not present

## 2022-04-21 DIAGNOSIS — Z79899 Other long term (current) drug therapy: Secondary | ICD-10-CM | POA: Diagnosis not present

## 2022-04-21 DIAGNOSIS — S42201A Unspecified fracture of upper end of right humerus, initial encounter for closed fracture: Secondary | ICD-10-CM | POA: Diagnosis not present

## 2022-04-21 DIAGNOSIS — J449 Chronic obstructive pulmonary disease, unspecified: Secondary | ICD-10-CM | POA: Diagnosis not present

## 2022-04-21 MED ORDER — METHOCARBAMOL 500 MG PO TABS: 500.0000 mg | ORAL_TABLET | Freq: Three times a day (TID) | ORAL | 0 refills | Status: DC | PRN

## 2022-04-21 MED ORDER — DOCUSATE SODIUM 100 MG PO CAPS: 100.0000 mg | ORAL_CAPSULE | Freq: Two times a day (BID) | ORAL | 0 refills | Status: DC

## 2022-04-21 MED ORDER — OXYCODONE HCL 5 MG PO TABS: 5.0000 mg | ORAL_TABLET | ORAL | 0 refills | Status: DC | PRN

## 2022-04-21 NOTE — Evaluation (Signed)
Occupational Therapy Evaluation Patient Details Name: Kristin Haas MRN: [id number] DOB: October 21, 1955 Today's Date: 04/21/2022   History of Present Illness Pt is a 67 y.o. female with proximal humerus fracture. S/p ORIF R proximal humerus fracture and aspirate from iliac crest 04/20/2022. PMH significant for afib, COPD, HTN, anxiety, depression.   Clinical Impression   PTA, pt lived alone and was independent. Upon eval, pt requiring light assist for sling application and will require assist for meal preparation and transportation at home. Pt reporting daughter and neighbor able to assist as needed. Pt educated and demonstrating use of compensatory techniques for bed mobility, sleep position, sling application, UB ADL, bathing within precautions. Reviewed precautions and NWB with patient as well as elbow/wrist/hand exercises per physician's orders. Recommending follow physician orders for discharge.      Recommendations for follow up therapy are one component of a multi-disciplinary discharge planning process, led by the attending physician.  Recommendations may be updated based on patient status, additional functional criteria and insurance authorization.   Assistance Recommended at Discharge Intermittent Supervision/Assistance  Patient can return home with the following A little help with bathing/dressing/bathroom;Assistance with cooking/housework;Assist for transportation    Functional Status Assessment  Patient has had a recent decline in their functional status and demonstrates the ability to make significant improvements in function in a reasonable and predictable amount of time.  Equipment Recommendations  None recommended by OT (Pt not interested)    Recommendations for Other Services       Precautions / Restrictions Precautions Precautions: Fall;Shoulder Type of Shoulder Precautions: NWB, no ROM shoulder, OK for AROM elbow/wrist/hand Restrictions Weight Bearing Restrictions:  Yes RUE Weight Bearing: Non weight bearing      Mobility Bed Mobility Overal bed mobility: Modified Independent                  Transfers Overall transfer level: Modified independent                        Balance Overall balance assessment: Modified Independent                                         ADL either performed or assessed with clinical judgement   ADL Overall ADL's : Needs assistance/impaired Eating/Feeding: Modified independent   Grooming: Modified independent   Upper Body Bathing: Modified independent   Lower Body Bathing: Modified independent   Upper Body Dressing : Minimal assistance Upper Body Dressing Details (indicate cue type and reason): for sling Lower Body Dressing: Modified independent Lower Body Dressing Details (indicate cue type and reason): donning underpants and pants this session Toilet Transfer: Modified Independent   Toileting- Clothing Manipulation and Hygiene: Modified independent     Tub/Shower Transfer Details (indicate cue type and reason): Pt reports she plans to sponge bathe for a while before attempting shower transfer Functional mobility during ADLs: Modified independent       Vision Baseline Vision/History: 1 Wears glasses Ability to See in Adequate Light: 0 Adequate Patient Visual Report: No change from baseline Vision Assessment?: No apparent visual deficits Additional Comments: reading glasses     Perception Perception Perception Tested?: No   Praxis Praxis Praxis tested?: Within functional limits    Pertinent Vitals/Pain Pain Assessment Pain Assessment: Faces Faces Pain Scale: Hurts little more Pain Descriptors / Indicators: Aching, Burning Pain Intervention(s): Limited activity within patient's tolerance, Monitored  during session     Hand Dominance Right   Extremity/Trunk Assessment Upper Extremity Assessment Upper Extremity Assessment: RUE deficits/detail RUE Deficits  / Details: [facility]ble to perform [facility]rist[facility]. No ROM shoulder per physician orders.   Lower Extremity Assessment Lower Extremity Assessment: Overall WFL for tasks assessed   Cervical / Trunk Assessment Cervical / Trunk Assessment: Normal   Communication Communication Communication: No difficulties   Cognition Arousal/Alertness: Awake/alert Behavior During Therapy: WFL for tasks assessed/performed Overall Cognitive Status: Within Functional Limits for tasks assessed                                 General Comments: Good use of compensatory techniques and problem solving during ADL to maintain precautions     General Comments  VSS    Exercises Exercises: Shoulder Shoulder Exercises Elbow Flexion: AROM, Right, 10 reps Elbow Extension: AROM, Right, 10 reps Wrist Flexion: AROM, Right, 10 reps Wrist Extension: AROM, Right, 10 reps Digit Composite Flexion: AROM, Right, 10 reps Composite Extension: AROM, Right, 10 reps Neck Flexion: AROM, 5 reps Neck Extension: AROM, 5 reps Neck Lateral Flexion - Right: AROM, 5 reps Neck Lateral Flexion - Left: AROM, 5 reps   Shoulder Instructions Shoulder Instructions Donning/doffing shirt without moving shoulder: Modified independent Method for sponge bathing under operated UE: Modified independent Donning/doffing sling/immobilizer: Minimal assistance Correct positioning of sling/immobilizer: Modified independent ROM for elbow, wrist and digits of operated UE: Modified independent Sling wearing schedule (on at all times/off for ADL's): Modified independent Proper positioning of operated UE when showering: Modified independent Positioning of UE while sleeping: Modified independent    Home Living Family/patient expects to be discharged to:: Private residence Living Arrangements: Alone Available Help at Discharge: Neighbor;Available PRN/intermittently Type of Home: Apartment (duplex) Home Access: Level entry     Home  Layout: One level     Bathroom Shower/Tub: Chief Strategy OfficerTub/shower unit   Bathroom Toilet: Standard     Home Equipment: Cane - single point          Prior Functioning/Environment Prior Level of Function : Independent/Modified Independent;Driving;History of Falls (last six months)             Mobility Comments: ambulating without AD. Used something to help push herself up from a chair/toilet. ADLs Comments: independent        OT Problem List: Decreased strength;Decreased activity tolerance;Decreased range of motion;Decreased knowledge of use of DME or AE;Pain;Impaired UE functional use      OT Treatment/Interventions:      OT Goals(Current goals can be found in the care plan section) Acute Rehab OT Goals Patient Stated Goal: go home OT Goal Formulation: With patient  OT Frequency:      Co-evaluation              AM-PAC OT "6 Clicks" Daily Activity     Outcome Measure Help from another person eating meals?: None Help from another person taking care of personal grooming?: None Help from another person toileting, which includes using toliet, bedpan, or urinal?: None Help from another person bathing (including washing, rinsing, drying)?: None Help from another person to put on and taking off regular upper body clothing?: A Little Help from another person to put on and taking off regular lower body clothing?: None 6 Click Score: 23   End of Session Equipment Utilized During Treatment: Other (comment) (sling) Nurse Communication: Mobility status  Activity Tolerance: Patient tolerated treatment well Patient left: in  bed;with call bell/phone within reach  OT Visit Diagnosis: Unsteadiness on feet (R26.81);Muscle weakness (generalized) (M62.81);Pain Pain - Right/Left: Right Pain - part of body: Shoulder                Time: 0929-1000 OT Time Calculation (min): 31 min Charges:  OT General Charges $OT Visit: 1 Visit OT Evaluation $OT Eval Low Complexity: 1 Low OT  Treatments $Self Care/Home Management : 8-22 mins  Tyler Deis, OTR/L [facility] Acute Rehabilitation Office: [phone]   Myrla Halsted 04/21/2022, 10:38 AM

## 2022-04-21 NOTE — Progress Notes (Signed)
CSW informed by RN that daughter Marchelle Folks called with concerns that pt house is very dirty and that pt is not taking good care of herself, asking about potential psych involvement.  CSW spoke with pt, who reports she does stay home most of the time, does not want to be away from her home.  Pt reports that she has gone through some "tough times" when she broke up with a long time partner some years ago and then when her son died in 2019/05/31.  Pt says that she does take medicine for depression and anxiety through her PCP but does not feel depressed currently.  CSW asked about any SI and pt responded, "Lord, no."    CSW spoke with daughter Marchelle Folks, discussed the above.  Marchelle Folks says her mother has not been "right" since her breakup and her son's death.  Not aware of SI but mother acts depressed, doesn't clean up, lays around.  Discussed living options: per Marchelle Folks pt has around $1500 in social security as her only income.  No other funds available for any sort of ALF type arrangement.  Marchelle Folks asked about [facility] aide type services and discussed that medicare does not pay for that.  Per Marchelle Folks, pt has been to DSS before to apply for food stamps and medicaid, not sure how long ago.  CSW suggested Marchelle Folks help mother get to DSS as medicaid eligibility would open access to services like HH aide to assist her at home.  PT/OT not recommending HH currently.    Message sent to MD/PA regarding this.  Daleen Squibb, MSW, LCSW 4/5/202411:22 AM

## 2022-04-21 NOTE — Evaluation (Signed)
Physical Therapy Evaluation Patient Details Name: Kristin Haas MRN: [id number] DOB: [birth year] Today's Date: 04/21/2022  History of Present Illness  Pt is a 67 y.o. female with proximal humerus fracture. S/p ORIF R proximal humerus fracture and aspirate from iliac crest 04/20/2022. PMH significant for afib, COPD, HTN, anxiety, depression.  Clinical Impression  Pt is presenting close to baseline for functional mobility at Independent to mod I. Pt requires a little extra time to perform some activities due to RUE limitations. Pt has daughters that can assist with transportation and PRN. Due to pt current functional status, home set up and available assistance at home no recommended skilled physical therapy services at this time. Pt was encouraged to contact MD for strength/balance to prevent falls in the future once her RUE has been resolved. Pt is cleared from a physical therapy stand point to go home with PRN assistance from family and neighbors pending medical clearance.       Recommendations for follow up therapy are one component of a multi-disciplinary discharge planning process, led by the attending physician.  Recommendations may be updated based on patient status, additional functional criteria and insurance authorization.  Follow Up Recommendations       Assistance Recommended at Discharge PRN  Patient can return home with the following  Assistance with cooking/housework;Help with stairs or ramp for entrance;Assist for transportation    Equipment Recommendations None recommended by PT  Recommendations for Other Services       Functional Status Assessment Patient has had a recent decline in their functional status and demonstrates the ability to make significant improvements in function in a reasonable and predictable amount of time.     Precautions / Restrictions Precautions Precautions: Fall;Shoulder Type of Shoulder Precautions: NWB, no ROM shoulder, OK for AROM  elbow/wrist/hand Restrictions Weight Bearing Restrictions: Yes RUE Weight Bearing: Non weight bearing      Mobility  Bed Mobility Overal bed mobility: Modified Independent     Patient Response: Cooperative  Transfers Overall transfer level: Modified independent    Ambulation/Gait Ambulation/Gait assistance: Independent   Assistive device: None Gait Pattern/deviations: WFL(Within Functional Limits)   Gait velocity interpretation: 1.31 - 2.62 ft/sec, indicative of limited community ambulator   General Gait Details: No noted deficits  Stairs Stairs: Yes Stairs assistance: Min guard Stair Management: No rails Number of Stairs: 2 General stair comments: Pt used no rails so required increased assistance. Pt states at home she uses her wall and door to help stabilize as she steps down the stairs.     Balance Overall balance assessment: No apparent balance deficits (not formally assessed)         Pertinent Vitals/Pain Pain Assessment Pain Assessment: 0-10 Pain Score: 1  Pain Descriptors / Indicators: Aching, Burning Pain Intervention(s): Monitored during session    Home Living Family/patient expects to be discharged to:: Private residence Living Arrangements: Alone Available Help at Discharge: Neighbor;Available PRN/intermittently Type of Home: Apartment (duplex) Home Access: Level entry       Home Layout: One level Home Equipment: Cane - single point      Prior Function Prior Level of Function : Independent/Modified Independent;Driving;History of Falls (last six months)             Mobility Comments: ambulating without AD. Used something to help push herself up from a chair/toilet. ADLs Comments: independent     Hand Dominance   Dominant Hand: Right    Extremity/Trunk Assessment   Upper Extremity Assessment Upper Extremity Assessment: RUE deficits/detail RUE  Deficits / Details: [facility]. No ROM shoulder per  physician orders.    Lower Extremity Assessment Lower Extremity Assessment: Overall WFL for tasks assessed    Cervical / Trunk Assessment Cervical / Trunk Assessment: Normal  Communication   Communication: No difficulties  Cognition Arousal/Alertness: Awake/alert Behavior During Therapy: WFL for tasks assessed/performed Overall Cognitive Status: Within Functional Limits for tasks assessed        General Comments General comments (skin integrity, edema, etc.): Pt has daughters that can provide assistance PRN        Assessment/Plan    PT Assessment Patient does not need any further PT services  PT Problem List         PT Treatment Interventions      PT Goals (Current goals can be found in the Care Plan section)  Acute Rehab PT Goals PT Goal Formulation: All assessment and education complete, DC therapy    Frequency       AM-PAC PT "6 Clicks" Mobility  Outcome Measure Help needed turning from your back to your side while in a flat bed without using bedrails?: None Help needed moving from lying on your back to sitting on the side of a flat bed without using bedrails?: None Help needed moving to and from a bed to a chair (including a wheelchair)?: None Help needed standing up from a chair using your arms (e.g., wheelchair or bedside chair)?: None Help needed to walk in hospital room?: None Help needed climbing 3-5 steps with a railing? : A Little 6 Click Score: 23    End of Session Equipment Utilized During Treatment: Gait belt Activity Tolerance: Patient tolerated treatment well Patient left: in chair;with call bell/phone within reach Nurse Communication: Mobility status      Time: [id number] PT Time Calculation (min) (ACUTE ONLY): 16 min   Charges:   PT Evaluation $PT Eval Low Complexity: 1 Low          Harrel Carinaatherine Dajanee Voorheis, DPT, CLT  Acute Rehabilitation Services Office: [phone] (Secure chat preferred)   Claudia DesanctisCatherine H Ladarryl Wrage 04/21/2022, 10:43 AM

## 2022-04-21 NOTE — Progress Notes (Signed)
  Subjective: Patient is a 67 year old female who presents s/p right proximal humerus fracture ORIF with administration of BMAC with bone graft from iliac crest.  She reports that pain is well-controlled.  Block is wearing off but she still has numbness in the humeral region.  No other complaints.   Objective: Vital signs in last 24 hours: Temp:  [97.7 F (36.5 C)-98.1 F (36.7 C)] 97.9 F (36.6 C) (04/05 0756) Pulse Rate:  [47-69] 69 (04/05 0756) Resp:  [7-18] 18 (04/05 0756) BP: (97-168)/(42-87) 127/52 (04/05 0756) SpO2:  [93 %-100 %] 97 % (04/05 0756) Weight:  [55.3 kg] 55.3 kg (04/04 0828)  Intake/Output from previous day: 04/04 0701 - 04/05 0700 In: 1331.8 [I.V.:1131.8; IV Piggyback:200] Out: 75 [Blood:75] Intake/Output this shift: No intake/output data recorded.  Exam:  2+ radial pulse of the operative extremity.  Intact EPL, FPL, finger abduction, finger adduction, grip strength, pronation/supination, bicep, tricep.  Dressings intact with no gross blood or drainage.  Labs: No results for input(s): "HGB" in the last 72 hours. No results for input(s): "WBC", "RBC", "HCT", "PLT" in the last 72 hours. No results for input(s): "NA", "K", "CL", "CO2", "BUN", "CREATININE", "GLUCOSE", "CALCIUM" in the last 72 hours. Recent Labs    04/20/22 0849  INR 1.0    Assessment/Plan: Patient is POD 1 s/p ORIF proximal humerus fracture.  Doing well.  Vital signs are stable.  Discussed the radiographs with her and discussed restrictions for her.  Also recommended she avoid smoking is much as possible.  Will plan to discharge her home today and follow-up with Dr. August Saucer in about 10 days.   Luke Zael Shuman 04/21/2022, 8:23 AM

## 2022-04-21 NOTE — Progress Notes (Signed)
Discharge instructions given. Patient verbalized understanding and all questions were answered.  ?

## 2022-04-22 ENCOUNTER — Telehealth: Payer: Self-pay | Admitting: Surgical

## 2022-04-22 ENCOUNTER — Encounter: Payer: Self-pay | Admitting: Surgical

## 2022-04-22 NOTE — Telephone Encounter (Signed)
I called patient to check on her.  She is at home by herself but she has her daughter checking on her every day.  Pain is controlled.  Block is worn off.  She has no fevers or chills.  Really no concerns and she is getting along pretty well at her house.  Has a little bit of burning sensation around the incision but there is no bleeding.  She is staying in sling and not lift anything with the operative arm.  She has resumed her Coumadin.  I recommended she take 2 doses of her normal Coumadin dose for the first 2 days after surgery and then resume her normal Coumadin dose.  She follows with her primary provider for management of her INR.  She is planning to call the office for follow-up visit to see Dr. August Saucer in clinic on 05/01/2022 which would be about 10 days out from surgery.  Call with any concerns in the meantime.

## 2022-04-25 ENCOUNTER — Encounter (HOSPITAL_COMMUNITY): Payer: Self-pay | Admitting: Orthopedic Surgery

## 2022-04-27 ENCOUNTER — Other Ambulatory Visit: Payer: Medicare PPO

## 2022-04-27 ENCOUNTER — Other Ambulatory Visit: Payer: Self-pay | Admitting: Cardiovascular Disease

## 2022-04-27 DIAGNOSIS — I2510 Atherosclerotic heart disease of native coronary artery without angina pectoris: Secondary | ICD-10-CM | POA: Diagnosis not present

## 2022-04-28 LAB — PROTIME-INR
INR: 2.5 — ABNORMAL HIGH (ref 0.9–1.2)
Prothrombin Time: 25.8 s — ABNORMAL HIGH (ref 9.1–12.0)

## 2022-04-30 NOTE — Discharge Summary (Signed)
Physician Discharge Summary      Patient ID: DYNVER CLEMSON MRN: [id number] DOB/AGE: [birth year] 67 y.o.  Admit date: 04/20/2022 Discharge date: 04/21/2022  Admission Diagnoses:  Principal Problem:   Proximal humerus fracture   Discharge Diagnoses:  Same  Surgeries: Procedure(s): OPEN REDUCTION INTERNAL  FIXATION (ORIF) RIGHT PROXIMAL HUMERUS FRACTURE & BONE MARROW ASPIRATE FROM ILIAC CREST on 04/20/2022   Consultants:   Discharged Condition: Stable  Hospital Course: JAMELA CUMBO is an 67 y.o. female who was admitted 04/20/2022 with a chief complaint of right shoulder pain, and found to have a diagnosis of right shoulder proximal humerus fracture.  They were brought to the operating room on 04/20/2022 and underwent the above named procedures.  Pt awoke from anesthesia without complication and was transferred to the floor. On POD1, patient's pain was controlled.  No red flag symptoms.  Vital signs are stable.  She was discharged home on POD 1..  Pt will f/u with Dr. August Saucer in clinic in ~2 weeks.   Antibiotics given:  Anti-infectives (From admission, onward)    Start     Dose/Rate Route Frequency Ordered Stop   04/20/22 1445  ceFAZolin (ANCEF) IVPB 2g/100 mL premix        2 g 200 mL/hr over 30 Minutes Intravenous Every 8 hours 04/20/22 1437 04/21/22 1107   04/20/22 0830  ceFAZolin (ANCEF) IVPB 2g/100 mL premix        2 g 200 mL/hr over 30 Minutes Intravenous On call to O.R. 04/20/22 0815 04/20/22 1105     .  Recent vital signs:  Vitals:   04/21/22 0756 04/21/22 1038  BP: (!) 127/52 (!) 110/51  Pulse: 69 66  Resp: 18   Temp: 97.9 F (36.6 C)   SpO2: 97%     Recent laboratory studies:  Results for orders placed or performed during the hospital encounter of 04/20/22  Protime-INR  Result Value Ref Range   Prothrombin Time 13.2 11.4 - 15.2 seconds   INR 1.0 0.8 - 1.2    Discharge Medications:   Allergies as of 04/21/2022       Reactions   Codeine Nausea And Vomiting         Medication List     STOP taking these medications    oxyCODONE-acetaminophen 5-325 MG tablet Commonly known as: PERCOCET/ROXICET       TAKE these medications    acetaminophen 500 MG tablet Commonly known as: TYLENOL Take 500 mg by mouth every 6 (six) hours as needed for mild pain.   amiodarone 200 MG tablet Commonly known as: PACERONE Take 1 tablet by mouth once daily   B-12 1000 MCG Subl Place 1,000 mcg under the tongue once a week. B-12 Drops   busPIRone 5 MG tablet Commonly known as: BUSPAR Take 5 mg by mouth daily.   citalopram 40 MG tablet Commonly known as: CELEXA Take 1 tablet (40 mg total) by mouth daily.   docusate sodium 100 MG capsule Commonly known as: COLACE Take 1 capsule (100 mg total) by mouth 2 (two) times daily.   ferrous sulfate 324 MG Tbec Take 324 mg by mouth once a week.   losartan 25 MG tablet Commonly known as: COZAAR Take 1 tablet (25 mg total) by mouth daily.   methocarbamol 500 MG tablet Commonly known as: ROBAXIN Take 1 tablet (500 mg total) by mouth every 8 (eight) hours as needed for muscle spasms.   metoprolol succinate 25 MG 24 hr tablet Commonly known as: TOPROL-XL Take  25 mg by mouth daily.   oxyCODONE 5 MG immediate release tablet Commonly known as: Oxy IR/ROXICODONE Take 1 tablet (5 mg total) by mouth every 4 (four) hours as needed for moderate pain (pain score 4-6).   rosuvastatin 40 MG tablet Commonly known as: CRESTOR Take 1 tablet (40 mg total) by mouth daily. What changed: when to take this   Vitamin D (Ergocalciferol) 1.25 MG (50000 UNIT) Caps capsule Commonly known as: DRISDOL Take 50,000 Units by mouth once a week.   warfarin 2 MG tablet Commonly known as: COUMADIN Take 1 tablet (2 mg total) by mouth daily.        Diagnostic Studies: DG Shoulder Right Port  Result Date: 04/20/2022 CLINICAL DATA:  Status post right shoulder surgery. EXAM: RIGHT SHOULDER - 1 VIEW COMPARISON:  Right shoulder  radiographs 04/08/2022, CT right shoulder 04/18/2022 FINDINGS: There is diffuse decreased bone mineralization. Interval lateral plate and screw fixation of the previously seen markedly comminuted fracture of the inferolateral humeral head and surgical neck. There is improved alignment on the provided single frontal view. The glenohumeral and acromioclavicular joints appear appropriately aligned. Mild acromioclavicular joint space narrowing with minimal peripheral degenerative osteophytosis. Visualized portion of the right lung is unremarkable. IMPRESSION: Interval lateral plate and screw fixation of the previously seen comminuted fracture of the proximal humerus. Improved alignment. Electronically Signed   By: Neita Garnet M.D.   On: 04/20/2022 15:23   DG Shoulder Right  Result Date: 04/20/2022 CLINICAL DATA:  RF right proximal humerus. Intraoperative fluoroscopy. EXAM: RIGHT SHOULDER - 2+ VIEW COMPARISON:  Right shoulder radiographs 04/08/2022 FINDINGS: Images were performed intraoperatively without the presence of a radiologist. Interval proximal humeral lateral endplate screw fixation of the previously seen comminuted surgical neck and inferior outer humeral head fracture. Improved alignment. The glenohumeral joint is appropriately aligned. Total fluoroscopy images: 2 Total fluoroscopy time: 24 seconds Total dose: Radiation Exposure Index (as provided by the fluoroscopic device): 1.34 mGy air Kerma Please see intraoperative findings for further detail. IMPRESSION: Intraoperative fluoroscopy for proximal humeral fracture fixation. Electronically Signed   By: Neita Garnet M.D.   On: 04/20/2022 12:36   DG C-Arm 1-60 Min-No Report  Result Date: 04/20/2022 Fluoroscopy was utilized by the requesting physician.  No radiographic interpretation.   DG C-Arm 1-60 Min-No Report  Result Date: 04/20/2022 Fluoroscopy was utilized by the requesting physician.  No radiographic interpretation.   CT SHOULDER RIGHT WO  CONTRAST  Result Date: 04/18/2022 CLINICAL DATA:  Right shoulder pain after fall on 04/08/2022 EXAM: CT OF THE UPPER RIGHT EXTREMITY WITHOUT CONTRAST TECHNIQUE: Multidetector CT imaging of the upper right extremity was performed according to the standard protocol. RADIATION DOSE REDUCTION: This exam was performed according to the departmental dose-optimization program which includes automated exposure control, adjustment of the mA and/or kV according to patient size and/or use of iterative reconstruction technique. COMPARISON:  X-ray 04/08/2022 FINDINGS: Bones/Joint/Cartilage Subacute fracture of the right humeral head and neck with transversely oriented component through the surgical neck which is moderately impacted and demonstrates varus angulation. Nondisplaced fracture extending to the proximal metaphysis along the medial cortex. Nondisplaced mildly comminuted fracture of the greater tuberosity. No fracture of the lesser tuberosity or humeral head articular surface. Glenohumeral joint alignment is maintained without dislocation. Intact AC joint with mild osteoarthritis. Remaining visualized osseous structures are otherwise intact and unremarkable. No lytic or sclerotic bone lesion. Ligaments Suboptimally assessed by CT. Muscles and Tendons No rotator cuff muscle atrophy or fatty infiltration. Rotator cuff tendons are  not well assessed by CT. Soft tissues No organized fluid collection or hematoma. No right axillary lymphadenopathy. Included portion of the right lung field is clear. IMPRESSION: Subacute fracture of the right humeral head and neck, as described. Electronically Signed   By: Duanne Guess D.O.   On: 04/18/2022 15:00   DG Shoulder Right  Result Date: 04/08/2022 CLINICAL DATA:  Fall, right shoulder EXAM: RIGHT SHOULDER - 2+ VIEW COMPARISON:  02/04/2020 FINDINGS: Acute fracture involving the surgical neck of the proximal right humerus with anterolateral displacement and varus angulation.  Suspected nondisplaced fracture involvement of the greater tuberosity. Glenohumeral joint alignment is maintained without dislocation. Intact AC joint. Soft tissue swelling at the fracture site. IMPRESSION: Acute displaced and angulated fracture of the right humeral neck. Electronically Signed   By: Duanne Guess D.O.   On: 04/08/2022 15:00    Disposition: Discharge disposition: 01-Home or Self Care       Discharge Instructions     Call MD / Call 911   Complete by: As directed    If you experience chest pain or shortness of breath, CALL 911 and be transported to the hospital emergency room.  If you develope a fever above 101 F, pus (white drainage) or increased drainage or redness at the wound, or calf pain, call your surgeon's office.   Constipation Prevention   Complete by: As directed    Drink plenty of fluids.  Prune juice may be helpful.  You may use a stool softener, such as Colace (over the counter) 100 mg twice a day.  Use MiraLax (over the counter) for constipation as needed.   Diet - low sodium heart healthy   Complete by: As directed    Discharge instructions   Complete by: As directed    No lifting with the operative arm or lifting the arm under its own power.  You may shower, dressings are waterproof.  Use the sling at all times, you may take it off for showering only.  You may come out of the sling 1-2 times per day to work on straightening your elbow, to avoid elbow stiffness.  Follow-up with Dr. August Saucer or Drema Pry in the clinic at your given appointment date in ~10 days.  If you do not have an appointment, call our office to schedule one postoperatively.  Call the office at [phone] with any questions/concerns or send [place] a message on MyChart   Increase activity slowly as tolerated   Complete by: As directed    Post-operative opioid taper instructions:   Complete by: As directed    POST-OPERATIVE OPIOID TAPER INSTRUCTIONS: It is important to wean off of your opioid  medication as soon as possible. If you do not need pain medication after your surgery it is ok to stop day one. Opioids include: Codeine, Hydrocodone(Norco, Vicodin), Oxycodone(Percocet, oxycontin) and hydromorphone amongst others.  Long term and even short term use of opiods can cause: Increased pain response Dependence Constipation Depression Respiratory depression And more.  Withdrawal symptoms can include Flu like symptoms Nausea, vomiting And more Techniques to manage these symptoms Hydrate well Eat regular healthy meals Stay active Use relaxation techniques(deep breathing, meditating, yoga) Do Not substitute Alcohol to help with tapering If you have been on opioids for less than two weeks and do not have pain than it is ok to stop all together.  Plan to wean off of opioids This plan should start within one week post op of your joint replacement. Maintain the same interval or  time between taking each dose and first decrease the dose.  Cut the total daily intake of opioids by one tablet each day Next start to increase the time between doses. The last dose that should be eliminated is the evening dose.           Follow-up Information     Miki Kins, FNP Follow up.   Specialty: Family Medicine Contact information: [address] [place] [address] [phone]         [facility] [place]. Schedule an appointment as soon as possible for a visit.   Specialty: Orthopedics Why: follow-up with Dr. August Saucer in about 10 days. Contact information: [address] [place] [address] [phone]                 Signed: Karenann Cai 04/30/2022, 4:16 PM

## 2022-05-03 ENCOUNTER — Ambulatory Visit: Payer: Medicare PPO | Admitting: Orthopedic Surgery

## 2022-05-03 ENCOUNTER — Encounter: Payer: Self-pay | Admitting: Orthopedic Surgery

## 2022-05-03 ENCOUNTER — Other Ambulatory Visit (INDEPENDENT_AMBULATORY_CARE_PROVIDER_SITE_OTHER): Payer: Medicare PPO

## 2022-05-03 DIAGNOSIS — S42291A Other displaced fracture of upper end of right humerus, initial encounter for closed fracture: Secondary | ICD-10-CM

## 2022-05-03 MED ORDER — TRAMADOL HCL 50 MG PO TABS: 50.0000 mg | ORAL_TABLET | Freq: Three times a day (TID) | ORAL | 0 refills | Status: DC | PRN

## 2022-05-03 MED ORDER — METHOCARBAMOL 500 MG PO TABS: 500.0000 mg | ORAL_TABLET | Freq: Three times a day (TID) | ORAL | 0 refills | Status: DC | PRN

## 2022-05-03 NOTE — Progress Notes (Signed)
Post-Op Visit Note   Patient: Kristin Haas           Date of Birth: 08-Dec-1955           MRN: [id number] Visit Date: 05/03/2022 PCP: Miki Kins, FNP   Assessment & Plan:  Chief Complaint:  Chief Complaint  Patient presents with   Right Shoulder - Follow-up, Fracture   Visit Diagnoses:  1. Closed 3-part fracture of proximal humerus, right, initial encounter     Plan: Gwendloyn is a patient who underwent right shoulder proximal humerus fracture open reduction internal fixation 04/20/2022.  Patient's been doing well.  Hip is not a problem.  On examination she has functional deltoid and predictably stiff shoulder.  I would like for her to start some pendulums 30 repetitions clockwise 30 counterclockwise 3 times a day and then go back in the sling.  Come back in 3 weeks for clinical recheck and radiographs and possible initiation of physical therapy at that time.  For now the only thing I really want her doing is the pendulum exercises.  Would like to see a little callus formation around that shoulder before she starts physical therapy.  Tramadol and Robaxin refilled.  Follow-Up Instructions: No follow-ups on file.   Orders:  Orders Placed This Encounter  Procedures   XR Shoulder Right   Meds ordered this encounter  Medications   traMADol (ULTRAM) 50 MG tablet    Sig: Take 1 tablet (50 mg total) by mouth every 8 (eight) hours as needed.    Dispense:  30 tablet    Refill:  0   methocarbamol (ROBAXIN) 500 MG tablet    Sig: Take 1 tablet (500 mg total) by mouth every 8 (eight) hours as needed for muscle spasms.    Dispense:  30 tablet    Refill:  0    Imaging: XR Shoulder Right  Result Date: 05/03/2022 2 views right proximal humerus reviewed.  Plate fixation of proximal humerus fracture in good position alignment with no complicating features.  Shoulder is located.  No hardware prominence.   PMFS History: Patient Active Problem List   Diagnosis Date Noted   Proximal  humerus fracture 04/20/2022   Closed 3-part fracture of proximal humerus, right, initial encounter 04/17/2022   Depression 04/04/2022   IDA (iron deficiency anemia) 12/06/2021   Abnormal CT of the chest 12/06/2021   Symptomatic anemia 07/11/2021   A-fib 02/04/2020   Supratherapeutic INR 02/04/2020   HTN (hypertension) 02/04/2020   HLD (hyperlipidemia) 02/04/2020   Past Medical History:  Diagnosis Date   Anxiety    Atrial fibrillation    Complication of anesthesia    difficulty waking up   COPD (chronic obstructive pulmonary disease)    Depression    Dysrhythmia    A-fib   Flushing 09/06/2015   Headache    Hypercholesteremia    Hypertension    Palpitation 09/06/2015   Rectus sheath hematoma, initial encounter 02/04/2020    Family History  Problem Relation Age of Onset   Hypertension Father    Lung cancer Father    Hypertension Sister    Uterine cancer Maternal Grandmother    Cervical cancer Paternal Grandmother    Lung cancer Son     Past Surgical History:  Procedure Laterality Date   HERNIA REPAIR     ORIF HUMERUS FRACTURE Right 04/20/2022   Procedure: OPEN REDUCTION INTERNAL  FIXATION (ORIF) RIGHT PROXIMAL HUMERUS FRACTURE & BONE MARROW ASPIRATE FROM ILIAC CREST;  Surgeon: Rise Paganini  Lorin Picket, MD;  Location: [facility] OR;  Service: Orthopedics;  Laterality: Right;   TONSILLECTOMY     TUBAL LIGATION     Social History   Occupational History   Not on file  Tobacco Use   Smoking status: Every Day    Packs/day: 0.50    Years: 50.00    Additional pack years: 0.00    Total pack years: 25.00    Types: Cigarettes   Smokeless tobacco: Never   Tobacco comments:    Patient down to 2 packs/week, which is approximately 1/2 of her previous intake.   Vaping Use   Vaping Use: Never used  Substance and Sexual Activity   Alcohol use: Yes    Comment: occasional/maybe once a year   Drug use: No   Sexual activity: Never

## 2022-05-22 ENCOUNTER — Other Ambulatory Visit: Payer: Self-pay | Admitting: Cardiovascular Disease

## 2022-05-24 ENCOUNTER — Telehealth: Payer: Self-pay

## 2022-05-24 ENCOUNTER — Ambulatory Visit (INDEPENDENT_AMBULATORY_CARE_PROVIDER_SITE_OTHER): Payer: Medicare PPO | Admitting: Orthopedic Surgery

## 2022-05-24 ENCOUNTER — Encounter: Payer: Self-pay | Admitting: Orthopedic Surgery

## 2022-05-24 ENCOUNTER — Other Ambulatory Visit (INDEPENDENT_AMBULATORY_CARE_PROVIDER_SITE_OTHER): Payer: Medicare PPO

## 2022-05-24 DIAGNOSIS — S42291A Other displaced fracture of upper end of right humerus, initial encounter for closed fracture: Secondary | ICD-10-CM

## 2022-05-24 NOTE — Telephone Encounter (Signed)
Pls see note from [employer].

## 2022-05-24 NOTE — Telephone Encounter (Signed)
-----   Message from Cammy Copa, MD sent at 05/24/2022  1:56 PM EDT ----- Cresenciano Lick can you have Harriett Sine follow-up with [facility] in 6 weeks.  Thanks

## 2022-05-24 NOTE — Progress Notes (Signed)
Post-Op Visit Note   Patient: Kristin Haas           Date of Birth: 12/08/1955           MRN: [id number] Visit Date: 05/24/2022 PCP: Miki Kins, FNP   Assessment & Plan:  Chief Complaint:  Chief Complaint  Patient presents with   Right Shoulder - Follow-up, Fracture   Visit Diagnoses:  1. Closed 3-part fracture of proximal humerus, right, initial encounter     Plan: Kristin Haas is a 67 year old patient is now a month out right shoulder open reduction internal fixation.  Not taking any pain medicine.  Taking Tylenol.  Doing pendulum exercises.  On examination the incision is intact.  Radiographs look good.  Deltoid fires but her shoulder is stiff.  Would like for her to increase pendulums and we will start her in physical therapy here for right shoulder passive range of motion as well as active assisted range of motion to pain tolerance.  3 times a week for 6 weeks and 6-week return with Kristin Haas.  Follow-Up Instructions: No follow-ups on file.   Orders:  Orders Placed This Encounter  Procedures   XR Shoulder Right   No orders of the defined types were placed in this encounter.   Imaging: XR Shoulder Right  Result Date: 05/24/2022 AP axillary lateral outlet views right shoulder reviewed.  Proximal humerus fracture with plate fixation unchanged in appearance.  Not too much callus formation visible.  No lucencies around the screws.  No hardware penetration.   PMFS History: Patient Active Problem List   Diagnosis Date Noted   Proximal humerus fracture 04/20/2022   Closed 3-part fracture of proximal humerus, right, initial encounter 04/17/2022   Depression 04/04/2022   IDA (iron deficiency anemia) 12/06/2021   Abnormal CT of the chest 12/06/2021   Symptomatic anemia 07/11/2021   A-fib (HCC) 02/04/2020   Supratherapeutic INR 02/04/2020   HTN (hypertension) 02/04/2020   HLD (hyperlipidemia) 02/04/2020   Past Medical History:  Diagnosis Date   Anxiety    Atrial  fibrillation (HCC)    Complication of anesthesia    difficulty waking up   COPD (chronic obstructive pulmonary disease) (HCC)    Depression    Dysrhythmia    A-fib   Flushing 09/06/2015   Headache    Hypercholesteremia    Hypertension    Palpitation 09/06/2015   Rectus sheath hematoma, initial encounter 02/04/2020    Family History  Problem Relation Age of Onset   Hypertension Father    Lung cancer Father    Hypertension Sister    Uterine cancer Maternal Grandmother    Cervical cancer Paternal Grandmother    Lung cancer Son     Past Surgical History:  Procedure Laterality Date   HERNIA REPAIR     ORIF HUMERUS FRACTURE Right 04/20/2022   Procedure: OPEN REDUCTION INTERNAL  FIXATION (ORIF) RIGHT PROXIMAL HUMERUS FRACTURE & BONE MARROW ASPIRATE FROM ILIAC CREST;  Surgeon: Cammy Copa, MD;  Location: [facility] OR;  Service: Orthopedics;  Laterality: Right;   TONSILLECTOMY     TUBAL LIGATION     Social History   Occupational History   Not on file  Tobacco Use   Smoking status: Every Day    Packs/day: 0.50    Years: 50.00    Additional pack years: 0.00    Total pack years: 25.00    Types: Cigarettes   Smokeless tobacco: Never   Tobacco comments:    Patient down to 2 packs/week,  which is approximately 1/2 of her previous intake.   Vaping Use   Vaping Use: Never used  Substance and Sexual Activity   Alcohol use: Yes    Comment: occasional/maybe once a year   Drug use: No   Sexual activity: Never

## 2022-05-24 NOTE — Addendum Note (Signed)
Addended byPrescott Parma on: 05/24/2022 02:25 PM   Modules accepted: Orders

## 2022-05-26 ENCOUNTER — Telehealth: Payer: Self-pay | Admitting: Family

## 2022-05-26 NOTE — Telephone Encounter (Signed)
Kristin Haas with [employer] called in to make sure we received paperwork for patient to have DEXA scan done due to recent bone fracture. Please order bone density scan.

## 2022-05-30 ENCOUNTER — Other Ambulatory Visit: Payer: Medicare PPO

## 2022-05-30 ENCOUNTER — Other Ambulatory Visit: Payer: Self-pay | Admitting: Cardiovascular Disease

## 2022-05-30 DIAGNOSIS — I2510 Atherosclerotic heart disease of native coronary artery without angina pectoris: Secondary | ICD-10-CM | POA: Diagnosis not present

## 2022-05-31 LAB — PROTIME-INR
INR: 2.2 — ABNORMAL HIGH (ref 0.9–1.2)
Prothrombin Time: 22.4 s — ABNORMAL HIGH (ref 9.1–12.0)

## 2022-06-06 ENCOUNTER — Other Ambulatory Visit: Payer: Self-pay

## 2022-06-06 ENCOUNTER — Encounter: Payer: Self-pay | Admitting: Physical Therapy

## 2022-06-06 ENCOUNTER — Ambulatory Visit: Payer: Medicare PPO | Admitting: Physical Therapy

## 2022-06-06 DIAGNOSIS — M25511 Pain in right shoulder: Secondary | ICD-10-CM | POA: Diagnosis not present

## 2022-06-06 DIAGNOSIS — M6281 Muscle weakness (generalized): Secondary | ICD-10-CM

## 2022-06-06 DIAGNOSIS — M25611 Stiffness of right shoulder, not elsewhere classified: Secondary | ICD-10-CM

## 2022-06-06 NOTE — Therapy (Signed)
OUTPATIENT PHYSICAL THERAPY SHOULDER EVALUATION Referring diagnosis? W09.811B  Treatment diagnosis? (if different than referring diagnosis) M25.511  What was this (referring dx) caused by? [x]  Surgery [x]  Fall []  Ongoing issue []  Arthritis []  Other: ____________  Laterality: [x]  Rt []  Lt []  Both  Check all possible CPT codes:  *CHOOSE 10 OR LESS*    [x]  97110 (Therapeutic Exercise)  []  92507 (SLP Treatment)  []  14782 (Neuro Re-ed)   []  92526 (Swallowing Treatment)   []  97116 (Gait Training)   []  [id number] (Cognitive Training, 1st 15 minutes) [x]  97140 (Manual Therapy)   []  97130 (Cognitive Training, each add'l 15 minutes)  []  97164 (Re-evaluation)                              []  Other, List CPT Code ____________  [x]  97530 (Therapeutic Activities)     [x]  97535 (Self Care)   []  All codes above (97110 - 97535)  []  97012 (Mechanical Traction)  [x]  97014 (E-stim Unattended)  []  97032 (E-stim manual)  []  97033 (Ionto)  []  97035 (Ultrasound) []  97750 (Physical Performance Training) []  [id number] (Aquatic Therapy) []  97016 (Vasopneumatic Device) []  [id number] (Paraffin) []  97034 (Contrast Bath) []  97597 (Wound Care 1st 20 sq cm) []  97598 (Wound Care each add'l 20 sq cm) []  97760 (Orthotic Fabrication, Fitting, Training Initial) []  [id number] (Prosthetic Management and Training Initial) []  [id number] (Orthotic or Prosthetic Training/ Modification Subsequent)   Patient Name: Kristin Haas MRN: [id number] DOB:March 09, 1955, 67 y.o., female Today's Date: 06/06/2022  END OF SESSION:  PT End of Session - 06/06/22 1429     Visit Number 1    Number of Visits 15    Date for PT Re-Evaluation 07/18/22    Authorization Type [employer]    PT Start Time 1347    PT Stop Time 1430    PT Time Calculation (min) 43 min    Activity Tolerance Patient tolerated treatment well    Behavior During Therapy WFL for tasks assessed/performed             Past Medical History:  Diagnosis Date   Anxiety    Atrial  fibrillation (HCC)    Complication of anesthesia    difficulty waking up   COPD (chronic obstructive pulmonary disease) (HCC)    Depression    Dysrhythmia    A-fib   Flushing 09/06/2015   Headache    Hypercholesteremia    Hypertension    Palpitation 09/06/2015   Rectus sheath hematoma, initial encounter 02/04/2020   Past Surgical History:  Procedure Laterality Date   HERNIA REPAIR     ORIF HUMERUS FRACTURE Right 04/20/2022   Procedure: OPEN REDUCTION INTERNAL  FIXATION (ORIF) RIGHT PROXIMAL HUMERUS FRACTURE & BONE MARROW ASPIRATE FROM ILIAC CREST;  Surgeon: Cammy Copa, MD;  Location: [facility] OR;  Service: Orthopedics;  Laterality: Right;   TONSILLECTOMY     TUBAL LIGATION     Patient Active Problem List   Diagnosis Date Noted   Proximal humerus fracture 04/20/2022   Closed 3-part fracture of proximal humerus, right, initial encounter 04/17/2022   Depression 04/04/2022   IDA (iron deficiency anemia) 12/06/2021   Abnormal CT of the chest 12/06/2021   Symptomatic anemia 07/11/2021   A-fib (HCC) 02/04/2020   Supratherapeutic INR 02/04/2020   HTN (hypertension) 02/04/2020   HLD (hyperlipidemia) 02/04/2020    PCP: Miki Kins   REFERRING PROVIDER: Cammy Copa, MD   REFERRING DIAG: [phone] (ICD-10-CM) -  Closed 3-part fracture of proximal humerus, right, initial encounter  THERAPY DIAG:  Acute pain of right shoulder  Stiffness of right shoulder, not elsewhere classified  Muscle weakness (generalized)  Rationale for Evaluation and Treatment: Rehabilitation  ONSET DATE: S/p ORIF R proximal humerus fracture 04/20/2022.  SUBJECTIVE:                                                                                                                                                                                      SUBJECTIVE STATEMENT: She fell and had shoulder fracture, had surgery 04/20/22. She is having associated pain and difficulty using her Rt arm. She says  she is not supposed to lift something over a pound.   Hand dominance: Right  PERTINENT HISTORY: PMH: S/p ORIF R proximal humerus fracture 04/20/2022. afib, COPD, HTN, anxiety, depression.   PAIN:  Are you having pain? Yes: NPRS scale: 0 at rest currently, but can get up to 3-4/10 Pain location: Rt shoulder Pain description: sharp shooting pain around incision Aggravating factors: using her arm Relieving factors: tyelonol  PRECAUTIONS: Shoulder MD recommending Right shoulder PROM and AAROM 3x/wk for 6 wks s/p right shoulder ORIF. Patient reports not lifting over 1# for now  WEIGHT BEARING RESTRICTIONS: No  FALLS:  Has patient fallen in last 6 months? Yes. Number of falls 1 reports it was a fluke fall and not balance deficits  OCCUPATION:retired   PLOF: Independent  PATIENT GOALS:reduce pain and use her arm better  NEXT MD VISIT:   OBJECTIVE:   DIAGNOSTIC FINDINGS:  05/24/22 shoulder XR AP axillary lateral outlet views right shoulder reviewed.  Proximal  humerus fracture with plate fixation unchanged in appearance.  Not too  much callus formation visible.  No lucencies around the screws.  No  hardware penetration.   PATIENT SURVEYS:  Eval: FOTO 43% functional intake, goal is 63%  COGNITION: Overall cognitive status: Within functional limits for tasks assessed     SENSATION: WFL   UPPER EXTREMITY ROM:   Active ROM/PROM Right eval Left eval  Shoulder flexion A:80 P:90   Shoulder extension    Shoulder abduction A:70 P:80   Shoulder adduction    Shoulder internal rotation P:40   Shoulder external rotation P:20   Elbow flexion    Elbow extension    Wrist flexion    Wrist extension    Wrist ulnar deviation    Wrist radial deviation    Wrist pronation    Wrist supination    (Blank rows = not tested)  UPPER EXTREMITY MMT:  MMT Right eval Left eval  Shoulder flexion 2   Shoulder extension    Shoulder abduction 2  Shoulder adduction    Shoulder  internal rotation 3   Shoulder external rotation 2   Middle trapezius    Lower trapezius    Elbow flexion    Elbow extension    Wrist flexion    Wrist extension    Wrist ulnar deviation    Wrist radial deviation    Wrist pronation    Wrist supination    Grip strength (lbs)    (Blank rows = not tested)  JOINT MOBILITY TESTING:  Eval: GH hypomobility noted    TODAY'S TREATMENT:  Eval HEP creation and review with demonstration and trial set preformed, see below for details Rt shoulder PROM gentle to tolerance all planes   PATIENT EDUCATION: Education details: HEP, PT plan of care Person educated: Patient Education method: Explanation, Demonstration, Verbal cues, and Handouts Education comprehension: verbalized understanding and needs further education   HOME EXERCISE PROGRAM: Access Code: [email or web address] URL: [email or web address] Date: 06/06/2022 Prepared by: Ivery Quale  Exercises - Seated Shoulder Pendulum Exercise  - 2 x daily - 6 x weekly - 1 sets - 20 reps - Seated Shoulder Flexion Towel Slide at Table Top  - 2 x daily - 6 x weekly - 1 sets - 10 reps - 5 sec hold - Seated Shoulder Abduction Towel Slide at Table Top  - 2 x daily - 6 x weekly - 1 sets - 10 reps - 5 sec hold - Seated Shoulder External Rotation PROM on Table  - 2-3 x daily - 6 x weekly - 1 sets - 10 reps - 5 sec hold - Seated Scapular Retraction with External Rotation  - 2 x daily - 6 x weekly - 3 sets - 10 reps - Seated Gripping Towel  - 2 x daily - 6 x weekly - 1 sets - 25 reps - Seated Shoulder Flexion Self PROM  - 2 x daily - 6 x weekly - 1 sets - 10 reps - 2 sec hold  ASSESSMENT:  CLINICAL IMPRESSION: Patient referred to PT S/p ORIF R proximal humerus fracture 04/20/2022. Patient reports she has 1# lifting restriction and MD referral recommends PROM and AAROM for 6 weeks. Patient will benefit from skilled PT to address below impairments, limitations and improve overall function.  OBJECTIVE  IMPAIRMENTS: decreased activity tolerance, decreased shoulder mobility, decreased ROM, decreased strength, impaired flexibility, impaired UE use,, and pain.  ACTIVITY LIMITATIONS: reaching, lifting, carry,  cleaning, driving, and or occupation  PERSONAL FACTORS: PMH: S/p ORIF R proximal humerus fracture 04/20/2022. afib, COPD, HTN, anxiety, depression.  also affecting patient's functional outcome.  REHAB POTENTIAL: Good  CLINICAL DECISION MAKING: Stable/uncomplicated  EVALUATION COMPLEXITY: Low    GOALS: Short term PT Goals Target date: 07/04/2022   Pt will be I and compliant with HEP. Baseline:  Goal status: New Pt will decrease pain by 25% overall with activity Baseline: Goal status: New  Long term PT goals Target date:07/18/2022   Pt will improve Rt shoulder AROM to [facility] to improve functional reaching Baseline: Goal status: New Pt will improve  Rt shoulder strength to at least 4/5 MMT to improve functional strength Baseline: Goal status: New Pt will improve FOTO to at least 63% functional to show improved function Baseline: Goal status: New Pt will reduce pain to overall less than 3/10 with usual activity and work activity. Baseline: Goal status: New  PLAN: PT FREQUENCY: 2-3 times per week   PT DURATION: 6 weeks  PLANNED INTERVENTIONS (unless contraindicated): aquatic PT, Canalith repositioning, cryotherapy, Electrical stimulation, Iontophoresis with  4 mg/ml dexamethasome, Moist heat, traction, Ultrasound, gait training, Therapeutic exercise, balance training, neuromuscular re-education, patient/family education, prosthetic training, manual techniques, passive ROM, dry needling, taping, vasopnuematic device, vestibular, spinal manipulations, joint manipulations  PLAN FOR NEXT SESSION: review HEP, MD recommending Right shoulder PROM and AAROM 3x/wk for 6 wks s/p right shoulder ORIF    April Manson, PT,DPT 06/06/2022, 2:34 PM

## 2022-06-07 ENCOUNTER — Encounter: Payer: Self-pay | Admitting: Physical Therapy

## 2022-06-07 ENCOUNTER — Ambulatory Visit: Payer: Medicare PPO | Admitting: Physical Therapy

## 2022-06-07 DIAGNOSIS — M25611 Stiffness of right shoulder, not elsewhere classified: Secondary | ICD-10-CM

## 2022-06-07 DIAGNOSIS — M6281 Muscle weakness (generalized): Secondary | ICD-10-CM

## 2022-06-07 DIAGNOSIS — M25511 Pain in right shoulder: Secondary | ICD-10-CM | POA: Diagnosis not present

## 2022-06-07 NOTE — Therapy (Addendum)
OUTPATIENT PHYSICAL THERAPY SHOULDER TREATMENT/ DISCHARGE   Patient Name: Kristin Haas MRN: [id number] DOB:Jun 25, 1955, 67 y.o., female Today's Date: 06/07/2022  END OF SESSION:  PT End of Session - 06/07/22 1304     Visit Number 2    Number of Visits 15    Date for PT Re-Evaluation 07/18/22    Authorization Type [employer]    PT Start Time 1300    PT Stop Time 1342    PT Time Calculation (min) 42 min    Activity Tolerance Patient tolerated treatment well    Behavior During Therapy WFL for tasks assessed/performed             Past Medical History:  Diagnosis Date   Anxiety    Atrial fibrillation (HCC)    Complication of anesthesia    difficulty waking up   COPD (chronic obstructive pulmonary disease) (HCC)    Depression    Dysrhythmia    A-fib   Flushing 09/06/2015   Headache    Hypercholesteremia    Hypertension    Palpitation 09/06/2015   Rectus sheath hematoma, initial encounter 02/04/2020   Past Surgical History:  Procedure Laterality Date   HERNIA REPAIR     ORIF HUMERUS FRACTURE Right 04/20/2022   Procedure: OPEN REDUCTION INTERNAL  FIXATION (ORIF) RIGHT PROXIMAL HUMERUS FRACTURE & BONE MARROW ASPIRATE FROM ILIAC CREST;  Surgeon: Cammy Copa, MD;  Location: [facility] OR;  Service: Orthopedics;  Laterality: Right;   TONSILLECTOMY     TUBAL LIGATION     Patient Active Problem List   Diagnosis Date Noted   Proximal humerus fracture 04/20/2022   Closed 3-part fracture of proximal humerus, right, initial encounter 04/17/2022   Depression 04/04/2022   IDA (iron deficiency anemia) 12/06/2021   Abnormal CT of the chest 12/06/2021   Symptomatic anemia 07/11/2021   A-fib (HCC) 02/04/2020   Supratherapeutic INR 02/04/2020   HTN (hypertension) 02/04/2020   HLD (hyperlipidemia) 02/04/2020    PCP: Miki Kins   REFERRING PROVIDER: Cammy Copa, MD   REFERRING DIAG: [phone] (ICD-10-CM) - Closed 3-part fracture of proximal humerus, right, initial  encounter  THERAPY DIAG:  Acute pain of right shoulder  Stiffness of right shoulder, not elsewhere classified  Muscle weakness (generalized)  Rationale for Evaluation and Treatment: Rehabilitation  ONSET DATE: S/p ORIF R proximal humerus fracture 04/20/2022.  SUBJECTIVE:                                                                                                                                                                                      SUBJECTIVE STATEMENT: She relays compliance with HEP. She says she will have to cancel  her remaining visits until at least 07/05/22 due to financial restrictions  Hand dominance: Right  PERTINENT HISTORY: PMH: S/p ORIF R proximal humerus fracture 04/20/2022. afib, COPD, HTN, anxiety, depression.   PAIN:  Are you having pain? Yes: NPRS scale: 0 at rest currently, but can get up to 3-4/10 Pain location: Rt shoulder Pain description: sharp shooting pain around incision Aggravating factors: using her arm Relieving factors: tyelonol  PRECAUTIONS: Shoulder MD recommending Right shoulder PROM and AAROM 3x/wk for 6 wks s/p right shoulder ORIF. Patient reports not lifting over 1# for now  WEIGHT BEARING RESTRICTIONS: No  FALLS:  Has patient fallen in last 6 months? Yes. Number of falls 1 reports it was a fluke fall and not balance deficits  OCCUPATION:retired   PLOF: Independent  PATIENT GOALS:reduce pain and use her arm better  NEXT MD VISIT:   OBJECTIVE:   DIAGNOSTIC FINDINGS:  05/24/22 shoulder XR AP axillary lateral outlet views right shoulder reviewed.  Proximal  humerus fracture with plate fixation unchanged in appearance.  Not too  much callus formation visible.  No lucencies around the screws.  No  hardware penetration.   PATIENT SURVEYS:  Eval: FOTO 43% functional intake, goal is 63%  COGNITION: Overall cognitive status: Within functional limits for tasks assessed     SENSATION: WFL   UPPER EXTREMITY ROM:    Active ROM/PROM Right eval Left eval  Shoulder flexion A:80 P:90   Shoulder extension    Shoulder abduction A:70 P:80   Shoulder adduction    Shoulder internal rotation P:40   Shoulder external rotation P:20   Elbow flexion    Elbow extension    Wrist flexion    Wrist extension    Wrist ulnar deviation    Wrist radial deviation    Wrist pronation    Wrist supination    (Blank rows = not tested)  UPPER EXTREMITY MMT:  MMT Right eval Left eval  Shoulder flexion 2   Shoulder extension    Shoulder abduction 2   Shoulder adduction    Shoulder internal rotation 3   Shoulder external rotation 2   Middle trapezius    Lower trapezius    Elbow flexion    Elbow extension    Wrist flexion    Wrist extension    Wrist ulnar deviation    Wrist radial deviation    Wrist pronation    Wrist supination    Grip strength (lbs)    (Blank rows = not tested)  JOINT MOBILITY TESTING:  Eval: GH hypomobility noted    TODAY'S TREATMENT:  06/07/22 Pulleys AAROM 3 min flexion, 3 min abduction (holding 3 sec each time) UBE L2 X 3 min fwd, 3 min retro Wall ladder X 10 flexion and X 10 scaption holding 3 sec Supine wand AAROM flexion, abd, ER X 15 each holding 3 sec Added additional exercises to HEP  Eval HEP creation and review with demonstration and trial set preformed, see below for details Rt shoulder PROM gentle to tolerance all planes   PATIENT EDUCATION: Education details: HEP, PT plan of care Person educated: Patient Education method: Explanation, Demonstration, Verbal cues, and Handouts Education comprehension: verbalized understanding and needs further education   HOME EXERCISE PROGRAM: Access Code: [email or web address] URL: [email or web address] Date: 06/07/2022 Prepared by: Ivery Quale  Exercises - Seated Shoulder Pendulum Exercise  - 2 x daily - 6 x weekly - 1 sets - 20 reps - Seated Shoulder Flexion Towel Slide at Table Top  - 2 x daily -  6 x weekly - 1  sets - 10 reps - 5 sec hold - Seated Shoulder Abduction Towel Slide at Table Top  - 2 x daily - 6 x weekly - 1 sets - 10 reps - 5 sec hold - Seated Shoulder External Rotation PROM on Table  - 2-3 x daily - 6 x weekly - 1 sets - 10 reps - 5 sec hold - Seated Scapular Retraction with External Rotation  - 2 x daily - 6 x weekly - 3 sets - 10 reps - Seated Gripping Towel  - 2 x daily - 6 x weekly - 1 sets - 25 reps - Seated Shoulder Flexion Self PROM  - 2 x daily - 6 x weekly - 1 sets - 10 reps - 2 sec hold - Seated Shoulder Flexion AAROM with Pulley Behind  - 2-3 x daily - 6 x weekly - 1 sets - 25 reps - 3 sec hold - Seated Shoulder Abduction AAROM with Pulley Behind (Mirrored)  - 2-3 x daily - 6 x weekly - 1 sets - 25 reps - 3 sec hold - Supine Shoulder Flexion Extension AAROM with Dowel  - 2-3 x daily - 6 x weekly - 1 sets - 10 reps - 3 sec hold - Supine Shoulder Abduction AAROM with Dowel (Mirrored)  - 2 x daily - 6 x weekly - 1 sets - 10 reps - 3 sec hold - Supine Shoulder External Rotation in 45 Degrees Abduction AAROM with Dowel (Mirrored)  - 2 x daily - 6 x weekly - 1 sets - 10 reps - 3 sec hold ASSESSMENT:  CLINICAL IMPRESSION: She shows good effort with PT and HEP. She relays she will be unable to attend PT as often as recommended due to financial restrictions so I did provide her with additional exercises to add into HEP and provided her with set of shoulder pulleys to take home for AAROM stretching as well.   OBJECTIVE IMPAIRMENTS: decreased activity tolerance, decreased shoulder mobility, decreased ROM, decreased strength, impaired flexibility, impaired UE use,, and pain.  ACTIVITY LIMITATIONS: reaching, lifting, carry,  cleaning, driving, and or occupation  PERSONAL FACTORS: PMH: S/p ORIF R proximal humerus fracture 04/20/2022. afib, COPD, HTN, anxiety, depression.  also affecting patient's functional outcome.  REHAB POTENTIAL: Good  CLINICAL DECISION MAKING:  Stable/uncomplicated  EVALUATION COMPLEXITY: Low    GOALS: Short term PT Goals Target date: 07/04/2022   Pt will be I and compliant with HEP. Baseline:  Goal status: New Pt will decrease pain by 25% overall with activity Baseline: Goal status: New  Long term PT goals Target date:07/18/2022   Pt will improve Rt shoulder AROM to [facility] to improve functional reaching Baseline: Goal status: New Pt will improve  Rt shoulder strength to at least 4/5 MMT to improve functional strength Baseline: Goal status: New Pt will improve FOTO to at least 63% functional to show improved function Baseline: Goal status: New Pt will reduce pain to overall less than 3/10 with usual activity and work activity. Baseline: Goal status: New  PLAN: PT FREQUENCY: 2-3 times per week   PT DURATION: 6 weeks  PLANNED INTERVENTIONS (unless contraindicated): aquatic PT, Canalith repositioning, cryotherapy, Electrical stimulation, Iontophoresis with 4 mg/ml dexamethasome, Moist heat, traction, Ultrasound, gait training, Therapeutic exercise, balance training, neuromuscular re-education, patient/family education, prosthetic training, manual techniques, passive ROM, dry needling, taping, vasopnuematic device, vestibular, spinal manipulations, joint manipulations  PLAN FOR NEXT SESSION: review HEP, MD recommending Right shoulder PROM and AAROM 3x/wk for 6  wks s/p right shoulder ORIF    April Manson, PT,DPT 06/07/2022, 1:43 PM   PHYSICAL THERAPY DISCHARGE SUMMARY  Visits from Start of Care: 2  Current functional level related to goals / functional outcomes: See note   Remaining deficits: See note   Education / Equipment: HEP  Patient goals were partially met. Patient is being discharged due to not returning since the last visit.  Chyrel Masson, PT, DPT, OCS, ATC 07/27/22  1:35 PM

## 2022-06-08 ENCOUNTER — Encounter: Payer: Medicare PPO | Admitting: Physical Therapy

## 2022-06-13 ENCOUNTER — Encounter: Payer: Medicare PPO | Admitting: Physical Therapy

## 2022-06-13 ENCOUNTER — Ambulatory Visit: Payer: Medicare PPO | Admitting: Cardiovascular Disease

## 2022-06-13 ENCOUNTER — Ambulatory Visit: Payer: Medicare PPO | Admitting: Internal Medicine

## 2022-06-14 ENCOUNTER — Encounter: Payer: Medicare PPO | Admitting: Orthopedic Surgery

## 2022-06-14 ENCOUNTER — Encounter: Payer: Medicare PPO | Admitting: Physical Therapy

## 2022-06-15 ENCOUNTER — Encounter: Payer: Medicare PPO | Admitting: Physical Therapy

## 2022-06-30 ENCOUNTER — Telehealth: Payer: Self-pay | Admitting: Family

## 2022-06-30 MED ORDER — BUSPIRONE HCL 5 MG PO TABS: 5.0000 mg | ORAL_TABLET | Freq: Every day | ORAL | 2 refills | Status: DC

## 2022-06-30 MED ORDER — AMIODARONE HCL 200 MG PO TABS: 200.0000 mg | ORAL_TABLET | Freq: Every day | ORAL | 2 refills | Status: DC

## 2022-06-30 NOTE — Telephone Encounter (Signed)
Patient called needing refills of her amiodarone and buspirone.  [facility] - [address], [address]

## 2022-07-04 ENCOUNTER — Encounter: Payer: Self-pay | Admitting: Family

## 2022-07-04 ENCOUNTER — Other Ambulatory Visit: Payer: Self-pay | Admitting: Cardiovascular Disease

## 2022-07-04 ENCOUNTER — Ambulatory Visit (INDEPENDENT_AMBULATORY_CARE_PROVIDER_SITE_OTHER): Payer: Medicare PPO | Admitting: Family

## 2022-07-04 VITALS — BP 150/82 | HR 51 | Ht 66.5 in | Wt 129.0 lb

## 2022-07-04 DIAGNOSIS — E559 Vitamin D deficiency, unspecified: Secondary | ICD-10-CM

## 2022-07-04 DIAGNOSIS — I10 Essential (primary) hypertension: Secondary | ICD-10-CM | POA: Diagnosis not present

## 2022-07-04 DIAGNOSIS — E538 Deficiency of other specified B group vitamins: Secondary | ICD-10-CM | POA: Diagnosis not present

## 2022-07-04 DIAGNOSIS — D509 Iron deficiency anemia, unspecified: Secondary | ICD-10-CM

## 2022-07-04 DIAGNOSIS — I482 Chronic atrial fibrillation, unspecified: Secondary | ICD-10-CM | POA: Diagnosis not present

## 2022-07-04 DIAGNOSIS — R5383 Other fatigue: Secondary | ICD-10-CM | POA: Diagnosis not present

## 2022-07-04 DIAGNOSIS — E782 Mixed hyperlipidemia: Secondary | ICD-10-CM

## 2022-07-04 DIAGNOSIS — M80021S Age-related osteoporosis with current pathological fracture, right humerus, sequela: Secondary | ICD-10-CM | POA: Diagnosis not present

## 2022-07-04 DIAGNOSIS — R7303 Prediabetes: Secondary | ICD-10-CM | POA: Diagnosis not present

## 2022-07-04 DIAGNOSIS — I2510 Atherosclerotic heart disease of native coronary artery without angina pectoris: Secondary | ICD-10-CM | POA: Diagnosis not present

## 2022-07-05 ENCOUNTER — Other Ambulatory Visit: Payer: Self-pay

## 2022-07-05 ENCOUNTER — Encounter: Payer: Medicare PPO | Admitting: Orthopedic Surgery

## 2022-07-05 ENCOUNTER — Ambulatory Visit: Payer: Medicare PPO | Admitting: Surgical

## 2022-07-05 ENCOUNTER — Ambulatory Visit: Payer: Medicare PPO | Admitting: Family

## 2022-07-05 DIAGNOSIS — S42291A Other displaced fracture of upper end of right humerus, initial encounter for closed fracture: Secondary | ICD-10-CM

## 2022-07-05 LAB — CMP14+EGFR
ALT: 22 IU/L (ref 0–32)
AST: 22 IU/L (ref 0–40)
Albumin: 4.4 g/dL (ref 3.9–4.9)
Alkaline Phosphatase: 109 IU/L (ref 44–121)
BUN/Creatinine Ratio: 19 (ref 12–28)
BUN: 19 mg/dL (ref 8–27)
Bilirubin Total: 0.2 mg/dL (ref 0.0–1.2)
CO2: 22 mmol/L (ref 20–29)
Calcium: 9.5 mg/dL (ref 8.7–10.3)
Chloride: 101 mmol/L (ref 96–106)
Creatinine, Ser: 0.98 mg/dL (ref 0.57–1.00)
Globulin, Total: 2.2 g/dL (ref 1.5–4.5)
Glucose: 98 mg/dL (ref 70–99)
Potassium: 4.6 mmol/L (ref 3.5–5.2)
Sodium: 137 mmol/L (ref 134–144)
Total Protein: 6.6 g/dL (ref 6.0–8.5)
eGFR: 63 mL/min/{1.73_m2} (ref >59)

## 2022-07-05 LAB — CBC WITH DIFFERENTIAL
Basophils Absolute: 0.1 10*3/uL (ref 0.0–0.2)
Basos: 1 %
EOS (ABSOLUTE): 0.2 10*3/uL (ref 0.0–0.4)
Eos: 2 %
Hematocrit: 35.2 % (ref 34.0–46.6)
Hemoglobin: 10.7 g/dL — ABNORMAL LOW (ref 11.1–15.9)
Immature Grans (Abs): 0 10*3/uL (ref 0.0–0.1)
Immature Granulocytes: 0 %
Lymphocytes Absolute: 1.5 10*3/uL (ref 0.7–3.1)
Lymphs: 22 %
MCH: 24.7 pg — ABNORMAL LOW (ref 26.6–33.0)
MCHC: 30.4 g/dL — ABNORMAL LOW (ref 31.5–35.7)
MCV: 81 fL (ref 79–97)
Monocytes Absolute: 0.4 10*3/uL (ref 0.1–0.9)
Monocytes: 6 %
Neutrophils Absolute: 4.8 10*3/uL (ref 1.4–7.0)
Neutrophils: 69 %
RBC: 4.33 x10E6/uL (ref 3.77–5.28)
RDW: 14.1 % (ref 11.7–15.4)
WBC: 6.9 10*3/uL (ref 3.4–10.8)

## 2022-07-05 LAB — LIPID PANEL
Chol/HDL Ratio: 2.9 ratio (ref 0.0–4.4)
Cholesterol, Total: 152 mg/dL (ref 100–199)
HDL: 53 mg/dL (ref >39)
LDL Chol Calc (NIH): 73 mg/dL (ref 0–99)
Triglycerides: 155 mg/dL — ABNORMAL HIGH (ref 0–149)
VLDL Cholesterol Cal: 26 mg/dL (ref 5–40)

## 2022-07-05 LAB — HEMOGLOBIN A1C
Est. average glucose Bld gHb Est-mCnc: 108 mg/dL
Hgb A1c MFr Bld: 5.4 % (ref 4.8–5.6)

## 2022-07-05 LAB — PROTIME-INR
INR: 2.3 — ABNORMAL HIGH (ref 0.9–1.2)
Prothrombin Time: 23.4 s — ABNORMAL HIGH (ref 9.1–12.0)

## 2022-07-05 LAB — TSH: TSH: 3.53 u[IU]/mL (ref 0.450–4.500)

## 2022-07-05 LAB — VITAMIN B12: Vitamin B-12: 318 pg/mL (ref 232–1245)

## 2022-07-05 LAB — VITAMIN D 25 HYDROXY (VIT D DEFICIENCY, FRACTURES): Vit D, 25-Hydroxy: 16.8 ng/mL — ABNORMAL LOW (ref 30.0–100.0)

## 2022-07-07 ENCOUNTER — Encounter: Payer: Medicare PPO | Admitting: Surgical

## 2022-07-11 ENCOUNTER — Ambulatory Visit: Payer: Medicare PPO | Admitting: Cardiovascular Disease

## 2022-07-11 VITALS — BP 125/55 | HR 53 | Ht 66.0 in | Wt 130.4 lb

## 2022-07-11 DIAGNOSIS — E782 Mixed hyperlipidemia: Secondary | ICD-10-CM | POA: Diagnosis not present

## 2022-07-11 DIAGNOSIS — I480 Paroxysmal atrial fibrillation: Secondary | ICD-10-CM | POA: Diagnosis not present

## 2022-07-11 DIAGNOSIS — I10 Essential (primary) hypertension: Secondary | ICD-10-CM | POA: Diagnosis not present

## 2022-07-13 ENCOUNTER — Encounter: Payer: Self-pay | Admitting: Family

## 2022-07-13 ENCOUNTER — Encounter: Payer: Self-pay | Admitting: Cardiovascular Disease

## 2022-07-13 MED ORDER — ROSUVASTATIN CALCIUM 40 MG PO TABS: 40.0000 mg | ORAL_TABLET | Freq: Every evening | ORAL | 2 refills | Status: DC

## 2022-07-13 NOTE — Assessment & Plan Note (Signed)
Patient is seen by Cardiology, who manage this condition.  She is well controlled with current therapy.   Will defer to them for further changes to plan of care.  

## 2022-07-13 NOTE — Progress Notes (Signed)
Established Patient Office Visit  Subjective:  Patient ID: Kristin Haas, female    DOB: 1955/05/18  Age: 67 y.o. MRN: [id number]  Chief Complaint  Patient presents with   Follow-up    3 month follow up    Patient is here today for her 3 months follow up.  She has been feeling well since last appointment.   She does not have additional concerns to discuss today.  Labs are due today. She needs refills.   I have reviewed her active problem list, medication list, allergies, notes from last encounter, lab results for her appointment today.    No other concerns at this time.   Past Medical History:  Diagnosis Date   Anxiety    Atrial fibrillation (HCC)    Complication of anesthesia    difficulty waking up   COPD (chronic obstructive pulmonary disease) (HCC)    Depression    Dysrhythmia    A-fib   Flushing 09/06/2015   Headache    Hypercholesteremia    Hypertension    Palpitation 09/06/2015   Rectus sheath hematoma, initial encounter 02/04/2020    Past Surgical History:  Procedure Laterality Date   HERNIA REPAIR     ORIF HUMERUS FRACTURE Right 04/20/2022   Procedure: OPEN REDUCTION INTERNAL  FIXATION (ORIF) RIGHT PROXIMAL HUMERUS FRACTURE & BONE MARROW ASPIRATE FROM ILIAC CREST;  Surgeon: Cammy Copa, MD;  Location: [facility] OR;  Service: Orthopedics;  Laterality: Right;   TONSILLECTOMY     TUBAL LIGATION      Social History   Socioeconomic History   Marital status: Single    Spouse name: Not on file   Number of children: Not on file   Years of education: Not on file   Highest education level: Not on file  Occupational History   Not on file  Tobacco Use   Smoking status: Every Day    Packs/day: 0.50    Years: 50.00    Additional pack years: 0.00    Total pack years: 25.00    Types: Cigarettes   Smokeless tobacco: Never   Tobacco comments:    Patient down to 2 packs/week, which is approximately 1/2 of her previous intake.   Vaping Use   Vaping Use:  Never used  Substance and Sexual Activity   Alcohol use: Yes    Comment: occasional/maybe once a year   Drug use: No   Sexual activity: Never  Other Topics Concern   Not on file  Social History Narrative   Patient used to be a Production designer, theatre/television/film in a warehouse.  Currently retired.  Active smoker.  No significant alcohol.   Social Determinants of Health   Financial Resource Strain: Not on file  Food Insecurity: No Food Insecurity (04/20/2022)   Hunger Vital Sign    Worried About Running Out of Food in the Last Year: Never true    Ran Out of Food in the Last Year: Never true  Transportation Needs: No Transportation Needs (04/20/2022)   PRAPARE - Administrator, Civil Service (Medical): No    Lack of Transportation (Non-Medical): No  Physical Activity: Not on file  Stress: Not on file  Social Connections: Not on file  Intimate Partner Violence: Not At Risk (04/20/2022)   Humiliation, Afraid, Rape, and Kick questionnaire    Fear of Current or Ex-Partner: No    Emotionally Abused: No    Physically Abused: No    Sexually Abused: No    Family History  Problem  Relation Age of Onset   Hypertension Father    Lung cancer Father    Hypertension Sister    Uterine cancer Maternal Grandmother    Cervical cancer Paternal Grandmother    Lung cancer Son     Allergies  Allergen Reactions   Codeine Nausea And Vomiting    Review of Systems  All other systems reviewed and are negative.      Objective:   BP (!) 150/82   Pulse (!) 51   Ht 5' 6.5" (1.689 m)   Wt 129 lb (58.5 kg)   SpO2 95%   BMI 20.51 kg/m   Vitals:   07/04/22 1150  BP: (!) 150/82  Pulse: (!) 51  Height: 5' 6.5" (1.689 m)  Weight: 129 lb (58.5 kg)  SpO2: 95%  BMI (Calculated): 20.51    Physical Exam Vitals and nursing note reviewed.  Constitutional:      Appearance: Normal appearance. She is normal weight.  HENT:     Head: Normocephalic and atraumatic.  Eyes:     Extraocular Movements: Extraocular  movements intact.     Conjunctiva/sclera: Conjunctivae normal.     Pupils: Pupils are equal, round, and reactive to light.  Cardiovascular:     Rate and Rhythm: Normal rate.  Pulmonary:     Effort: Pulmonary effort is normal.  Neurological:     General: No focal deficit present.     Mental Status: She is alert and oriented to person, place, and time. Mental status is at baseline.      Results for orders placed or performed in visit on 07/04/22  Protime-INR  Result Value Ref Range   INR 2.3 (H) 0.9 - 1.2   Prothrombin Time 23.4 (H) 9.1 - 12.0 sec  Results for orders placed or performed in visit on 07/04/22  Lipid panel  Result Value Ref Range   Cholesterol, Total 152 100 - 199 mg/dL   Triglycerides 119 (H) 0 - 149 mg/dL   HDL 53 >14 mg/dL   VLDL Cholesterol Cal 26 5 - 40 mg/dL   LDL Chol Calc (NIH) 73 0 - 99 mg/dL   Chol/HDL Ratio 2.9 0.0 - 4.4 ratio  VITAMIN D 25 Hydroxy (Vit-D Deficiency, Fractures)  Result Value Ref Range   Vit D, 25-Hydroxy 16.8 (L) 30.0 - 100.0 ng/mL  CBC With Differential  Result Value Ref Range   WBC 6.9 3.4 - 10.8 x10E3/uL   RBC 4.33 3.77 - 5.28 x10E6/uL   Hemoglobin 10.7 (L) 11.1 - 15.9 g/dL   Hematocrit 78.2 95.6 - 46.6 %   MCV 81 79 - 97 fL   MCH 24.7 (L) 26.6 - 33.0 pg   MCHC 30.4 (L) 31.5 - 35.7 g/dL   RDW 21.3 08.6 - 57.8 %   Neutrophils 69 Not Estab. %   Lymphs 22 Not Estab. %   Monocytes 6 Not Estab. %   Eos 2 Not Estab. %   Basos 1 Not Estab. %   Neutrophils Absolute 4.8 1.4 - 7.0 x10E3/uL   Lymphocytes Absolute 1.5 0.7 - 3.1 x10E3/uL   Monocytes Absolute 0.4 0.1 - 0.9 x10E3/uL   EOS (ABSOLUTE) 0.2 0.0 - 0.4 x10E3/uL   Basophils Absolute 0.1 0.0 - 0.2 x10E3/uL   Immature Granulocytes 0 Not Estab. %   Immature Grans (Abs) 0.0 0.0 - 0.1 x10E3/uL  CMP14+EGFR  Result Value Ref Range   Glucose 98 70 - 99 mg/dL   BUN 19 8 - 27 mg/dL   Creatinine, Ser 4.69 0.57 -  1.00 mg/dL   eGFR 63 >91 YN/WGN/5.62   BUN/Creatinine Ratio 19 12 - 28    Sodium 137 134 - 144 mmol/L   Potassium 4.6 3.5 - 5.2 mmol/L   Chloride 101 96 - 106 mmol/L   CO2 22 20 - 29 mmol/L   Calcium 9.5 8.7 - 10.3 mg/dL   Total Protein 6.6 6.0 - 8.5 g/dL   Albumin 4.4 3.9 - 4.9 g/dL   Globulin, Total 2.2 1.5 - 4.5 g/dL   Bilirubin Total 0.2 0.0 - 1.2 mg/dL   Alkaline Phosphatase 109 44 - 121 IU/L   AST 22 0 - 40 IU/L   ALT 22 0 - 32 IU/L  TSH  Result Value Ref Range   TSH 3.530 0.450 - 4.500 uIU/mL  Hemoglobin A1c  Result Value Ref Range   Hgb A1c MFr Bld 5.4 4.8 - 5.6 %   Est. average glucose Bld gHb Est-mCnc 108 mg/dL  Vitamin Z30  Result Value Ref Range   Vitamin B-12 318 232 - 1,245 pg/mL    Recent Results (from the past 2160 hour(s))  Protime-INR     Status: None   Collection Time: 04/20/22  8:49 AM  Result Value Ref Range   Prothrombin Time 13.2 11.4 - 15.2 seconds   INR 1.0 0.8 - 1.2    Comment: (NOTE) INR goal varies based on device and disease states. Performed at [facility], [address], [place], [place] [address]   Protime-INR     Status: Abnormal   Collection Time: 04/27/22 10:34 AM  Result Value Ref Range   INR 2.5 (H) 0.9 - 1.2    Comment: Reference interval is for non-anticoagulated patients. Suggested INR therapeutic range for Vitamin K antagonist therapy:    Standard Dose (moderate intensity                   therapeutic range):       2.0 - 3.0    Higher intensity therapeutic range       2.5 - 3.5    Prothrombin Time 25.8 (H) 9.1 - 12.0 sec  Protime-INR     Status: Abnormal   Collection Time: 05/30/22 11:58 AM  Result Value Ref Range   INR 2.2 (H) 0.9 - 1.2    Comment: Reference interval is for non-anticoagulated patients. Suggested INR therapeutic range for Vitamin K antagonist therapy:    Standard Dose (moderate intensity                   therapeutic range):       2.0 - 3.0    Higher intensity therapeutic range       2.5 - 3.5    Prothrombin Time 22.4 (H) 9.1 - 12.0 sec  Lipid panel     Status:  Abnormal   Collection Time: 07/04/22 12:27 PM  Result Value Ref Range   Cholesterol, Total 152 100 - 199 mg/dL   Triglycerides 469 (H) 0 - 149 mg/dL   HDL 53 >62 mg/dL   VLDL Cholesterol Cal 26 5 - 40 mg/dL   LDL Chol Calc (NIH) 73 0 - 99 mg/dL   Chol/HDL Ratio 2.9 0.0 - 4.4 ratio    Comment:                                   T. Chol/HDL Ratio  Men  Women                               1/2 Avg.Risk  3.4    3.3                                   Avg.Risk  5.0    4.4                                2X Avg.Risk  9.6    7.1                                3X Avg.Risk 23.4   11.0   VITAMIN D 25 Hydroxy (Vit-D Deficiency, Fractures)     Status: Abnormal   Collection Time: 07/04/22 12:27 PM  Result Value Ref Range   Vit D, 25-Hydroxy 16.8 (L) 30.0 - 100.0 ng/mL    Comment: Vitamin D deficiency has been defined by the Institute of Medicine and an Endocrine Society practice guideline as a level of serum 25-OH vitamin D less than 20 ng/mL (1,2). The Endocrine Society went on to further define vitamin D insufficiency as a level between 21 and 29 ng/mL (2). 1. IOM (Institute of Medicine). 2010. Dietary reference    intakes for calcium and D. [place]: The    [employer]. 2. Holick MF, Binkley Benton, Bischoff-Ferrari HA, et al.    Evaluation, treatment, and prevention of vitamin D    deficiency: an Endocrine Society clinical practice    guideline. JCEM. 2011 Jul; 96(7):1911-30.   CBC With Differential     Status: Abnormal   Collection Time: 07/04/22 12:27 PM  Result Value Ref Range   WBC 6.9 3.4 - 10.8 x10E3/uL   RBC 4.33 3.77 - 5.28 x10E6/uL   Hemoglobin 10.7 (L) 11.1 - 15.9 g/dL   Hematocrit 78.2 95.6 - 46.6 %   MCV 81 79 - 97 fL   MCH 24.7 (L) 26.6 - 33.0 pg   MCHC 30.4 (L) 31.5 - 35.7 g/dL   RDW 21.3 08.6 - 57.8 %   Neutrophils 69 Not Estab. %   Lymphs 22 Not Estab. %   Monocytes 6 Not Estab. %   Eos 2 Not Estab. %   Basos 1  Not Estab. %   Neutrophils Absolute 4.8 1.4 - 7.0 x10E3/uL   Lymphocytes Absolute 1.5 0.7 - 3.1 x10E3/uL   Monocytes Absolute 0.4 0.1 - 0.9 x10E3/uL   EOS (ABSOLUTE) 0.2 0.0 - 0.4 x10E3/uL   Basophils Absolute 0.1 0.0 - 0.2 x10E3/uL   Immature Granulocytes 0 Not Estab. %   Immature Grans (Abs) 0.0 0.0 - 0.1 x10E3/uL    Comment: **Effective August 14, 2022, profile [id number] CBC/Differential**   (No Platelet) will be made non-orderable. [facility] Offers:   [id number] CBC With Differential/Platelet   CMP14+EGFR     Status: None   Collection Time: 07/04/22 12:27 PM  Result Value Ref Range   Glucose 98 70 - 99 mg/dL   BUN 19 8 - 27 mg/dL   Creatinine, Ser 5.28 0.57 - 1.00 mg/dL   eGFR 63 >41 LK/GMW/1.02   BUN/Creatinine Ratio 19 12 - 28   Sodium 137 134 - 144 mmol/L   Potassium 4.6 3.5 -  5.2 mmol/L   Chloride 101 96 - 106 mmol/L   CO2 22 20 - 29 mmol/L   Calcium 9.5 8.7 - 10.3 mg/dL   Total Protein 6.6 6.0 - 8.5 g/dL   Albumin 4.4 3.9 - 4.9 g/dL   Globulin, Total 2.2 1.5 - 4.5 g/dL   Bilirubin Total 0.2 0.0 - 1.2 mg/dL   Alkaline Phosphatase 109 44 - 121 IU/L   AST 22 0 - 40 IU/L   ALT 22 0 - 32 IU/L  TSH     Status: None   Collection Time: 07/04/22 12:27 PM  Result Value Ref Range   TSH 3.530 0.450 - 4.500 uIU/mL  Hemoglobin A1c     Status: None   Collection Time: 07/04/22 12:27 PM  Result Value Ref Range   Hgb A1c MFr Bld 5.4 4.8 - 5.6 %    Comment:          Prediabetes: 5.7 - 6.4          Diabetes: >6.4          Glycemic control for adults with diabetes: <7.0    Est. average glucose Bld gHb Est-mCnc 108 mg/dL  Vitamin B14     Status: None   Collection Time: 07/04/22 12:27 PM  Result Value Ref Range   Vitamin B-12 318 232 - 1,245 pg/mL  Protime-INR     Status: Abnormal   Collection Time: 07/04/22  1:10 PM  Result Value Ref Range   INR 2.3 (H) 0.9 - 1.2    Comment: Reference interval is for non-anticoagulated patients. Suggested INR therapeutic range for Vitamin K antagonist  therapy:    Standard Dose (moderate intensity                   therapeutic range):       2.0 - 3.0    Higher intensity therapeutic range       2.5 - 3.5    Prothrombin Time 23.4 (H) 9.1 - 12.0 sec       Assessment & Plan:   Problem List Items Addressed This Visit       Active Problems   A-fib [facility])    Patient is seen by Cardiology, who manage this condition.  She is well controlled with current therapy.   Will defer to them for further changes to plan of care.       Relevant Orders   CBC With Differential (Completed)   CMP14+EGFR (Completed)   HTN (hypertension)    Blood pressure well controlled with current medications.  Continue current therapy.  Will reassess at follow up.       Relevant Orders   CBC With Differential (Completed)   CMP14+EGFR (Completed)   HLD (hyperlipidemia)    Checking labs today.  Continue current therapy for lipid control. Will modify as needed based on labwork results.       Relevant Orders   Lipid panel (Completed)   CBC With Differential (Completed)   CMP14+EGFR (Completed)   IDA (iron deficiency anemia)    Patient stable.  Well controlled with current therapy.   Continue current meds.       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  Given recent fracture, we will await these results.   Relevant Orders   DG Bone  Density   CBC With Differential (Completed)   CMP14+EGFR (Completed)   Vitamin D deficiency, unspecified       Checking labs today.  Will continue supplements as needed.   Relevant Orders   VITAMIN D 25 Hydroxy (Vit-D Deficiency, Fractures) (Completed)   CBC With Differential (Completed)   CMP14+EGFR (Completed)   B12 deficiency due to diet       Checking labs today.  Will continue supplements as needed.   Relevant Orders   CBC  With Differential (Completed)   CMP14+EGFR (Completed)   Vitamin B12 (Completed)   Other fatigue       Relevant Orders   CBC With Differential (Completed)   CMP14+EGFR (Completed)   TSH (Completed)   Prediabetes       Will reassess at follow up after next lab check.?Patient counseled on dietary choices and verbalized understanding.   Relevant Orders   Hemoglobin A1c (Completed)       Return in about 3 months (around 10/04/2022).   Total time spent: 20 minutes  Miki Kins, FNP  07/04/2022   This document may have been prepared by [facility] Voice Recognition software and as such may include unintentional dictation errors.

## 2022-07-13 NOTE — Assessment & Plan Note (Signed)
Checking labs today.  Continue current therapy for lipid control. Will modify as needed based on labwork results.  

## 2022-07-13 NOTE — Assessment & Plan Note (Signed)
Blood pressure well controlled with current medications.  Continue current therapy.  Will reassess at follow up.  

## 2022-07-13 NOTE — Progress Notes (Signed)
Cardiology Office Note   Date:  07/13/2022   ID:  LAVENIA STUMPO, DOB 1955/03/28, MRN [id number]  PCP:  Miki Kins, FNP  Cardiologist:  Adrian Blackwater, MD      History of Present Illness: Kristin Haas is a 67 y.o. female who presents for  Chief Complaint  Patient presents with   Follow-up    Results    Doing well      Past Medical History:  Diagnosis Date   Anxiety    Atrial fibrillation (HCC)    Complication of anesthesia    difficulty waking up   COPD (chronic obstructive pulmonary disease) (HCC)    Depression    Dysrhythmia    A-fib   Flushing 09/06/2015   Headache    Hypercholesteremia    Hypertension    Palpitation 09/06/2015   Rectus sheath hematoma, initial encounter 02/04/2020     Past Surgical History:  Procedure Laterality Date   HERNIA REPAIR     ORIF HUMERUS FRACTURE Right 04/20/2022   Procedure: OPEN REDUCTION INTERNAL  FIXATION (ORIF) RIGHT PROXIMAL HUMERUS FRACTURE & BONE MARROW ASPIRATE FROM ILIAC CREST;  Surgeon: Cammy Copa, MD;  Location: [facility] OR;  Service: Orthopedics;  Laterality: Right;   TONSILLECTOMY     TUBAL LIGATION       Current Outpatient Medications  Medication Sig Dispense Refill   acetaminophen (TYLENOL) 500 MG tablet Take 500 mg by mouth every 6 (six) hours as needed for mild pain.     amiodarone (PACERONE) 200 MG tablet Take 1 tablet (200 mg total) by mouth daily. 30 tablet 2   busPIRone (BUSPAR) 5 MG tablet Take 1 tablet (5 mg total) by mouth daily. 30 tablet 2   citalopram (CELEXA) 40 MG tablet Take 1 tablet (40 mg total) by mouth daily. 90 tablet 1   Cyanocobalamin (B-12) 1000 MCG SUBL Place 1,000 mcg under the tongue once a week. B-12 Drops     ferrous sulfate 324 MG TBEC Take 324 mg by mouth once a week.     losartan (COZAAR) 25 MG tablet Take 1 tablet (25 mg total) by mouth daily. 30 tablet 11   metoprolol succinate (TOPROL-XL) 25 MG 24 hr tablet Take 1 tablet by mouth once daily 90 tablet 0    rosuvastatin (CRESTOR) 40 MG tablet Take 1 tablet (40 mg total) by mouth every evening. 30 tablet 2   warfarin (COUMADIN) 2 MG tablet Take 1 tablet (2 mg total) by mouth daily. 30 tablet 3   No current facility-administered medications for this visit.    Allergies:   Codeine    Social History:   reports that she has been smoking cigarettes. She has a 25.00 pack-year smoking history. She has never used smokeless tobacco. She reports current alcohol use. She reports that she does not use drugs.   Family History:  family history includes Cervical cancer in her paternal grandmother; Hypertension in her father and sister; Lung cancer in her father and son; Uterine cancer in her maternal grandmother.    ROS:     Review of Systems  Constitutional: Negative.   HENT: Negative.    Eyes: Negative.   Respiratory: Negative.    Gastrointestinal: Negative.   Genitourinary: Negative.   Musculoskeletal: Negative.   Skin: Negative.   Neurological: Negative.   Endo/Heme/Allergies: Negative.   Psychiatric/Behavioral: Negative.    All other systems reviewed and are negative.     All other systems are reviewed and negative.  PHYSICAL EXAM: VS:  BP (!) 125/55   Pulse (!) 53   Ht 5\' 6"  (1.676 m)   Wt 130 lb 6.4 oz (59.1 kg)   SpO2 95%   BMI 21.05 kg/m  , BMI Body mass index is 21.05 kg/m. Last weight:  Wt Readings from Last 3 Encounters:  07/11/22 130 lb 6.4 oz (59.1 kg)  07/04/22 129 lb (58.5 kg)  04/20/22 122 lb (55.3 kg)     Physical Exam Constitutional:      Appearance: Normal appearance.  Cardiovascular:     Rate and Rhythm: Normal rate and regular rhythm.     Heart sounds: Normal heart sounds.  Pulmonary:     Effort: Pulmonary effort is normal.     Breath sounds: Normal breath sounds.  Musculoskeletal:     Right lower leg: No edema.     Left lower leg: No edema.  Neurological:     Mental Status: She is alert.       EKG:   Recent Labs: 04/05/2022: Platelets  212 07/04/2022: ALT 22; BUN 19; Creatinine, Ser 0.98; Hemoglobin 10.7; Potassium 4.6; Sodium 137; TSH 3.530    Lipid Panel    Component Value Date/Time   CHOL 152 07/04/2022 1227   TRIG 155 (H) 07/04/2022 1227   HDL 53 07/04/2022 1227   CHOLHDL 2.9 07/04/2022 1227   CHOLHDL 4.6 09/06/2015 1324   VLDL 29 09/06/2015 1324   LDLCALC 73 07/04/2022 1227      Other studies Reviewed: Additional studies/ records that were reviewed today include:  Review of the above records demonstrates:       No data to display            ASSESSMENT AND PLAN:    ICD-10-CM   1. Paroxysmal atrial fibrillation (HCC)  I48.0 rosuvastatin (CRESTOR) 40 MG tablet   Patient is doing well with no chest pain or shortness of breath and remains in sinus rhythm.  INR was 2.3    2. Primary hypertension  I10 rosuvastatin (CRESTOR) 40 MG tablet    3. Mixed hyperlipidemia  E78.2 rosuvastatin (CRESTOR) 40 MG tablet       Problem List Items Addressed This Visit       Cardiovascular and Mediastinum   A-fib (HCC) - Primary   Relevant Medications   rosuvastatin (CRESTOR) 40 MG tablet   HTN (hypertension)   Relevant Medications   rosuvastatin (CRESTOR) 40 MG tablet     Other   HLD (hyperlipidemia)   Relevant Medications   rosuvastatin (CRESTOR) 40 MG tablet       Disposition:   No follow-ups on file.    Total time spent: 30 minutes  Signed,  Adrian Blackwater, MD  07/13/2022 11:08 AM    [facility]

## 2022-07-13 NOTE — Assessment & Plan Note (Signed)
Patient stable.  Well controlled with current therapy.   Continue current meds.  

## 2022-07-15 ENCOUNTER — Other Ambulatory Visit: Payer: Self-pay | Admitting: Cardiovascular Disease

## 2022-07-25 ENCOUNTER — Ambulatory Visit (INDEPENDENT_AMBULATORY_CARE_PROVIDER_SITE_OTHER): Payer: Medicare PPO

## 2022-07-25 DIAGNOSIS — M8589 Other specified disorders of bone density and structure, multiple sites: Secondary | ICD-10-CM

## 2022-07-25 DIAGNOSIS — M80021S Age-related osteoporosis with current pathological fracture, right humerus, sequela: Secondary | ICD-10-CM | POA: Diagnosis not present

## 2022-08-16 ENCOUNTER — Other Ambulatory Visit: Payer: Self-pay | Admitting: Cardiovascular Disease

## 2022-08-17 ENCOUNTER — Other Ambulatory Visit: Payer: Self-pay | Admitting: Cardiovascular Disease

## 2022-08-20 ENCOUNTER — Other Ambulatory Visit: Payer: Self-pay | Admitting: Family

## 2022-09-04 ENCOUNTER — Other Ambulatory Visit (INDEPENDENT_AMBULATORY_CARE_PROVIDER_SITE_OTHER): Payer: Medicare PPO

## 2022-09-04 ENCOUNTER — Ambulatory Visit: Payer: Medicare PPO | Admitting: Surgical

## 2022-09-04 ENCOUNTER — Encounter: Payer: Self-pay | Admitting: Surgical

## 2022-09-04 DIAGNOSIS — S42291A Other displaced fracture of upper end of right humerus, initial encounter for closed fracture: Secondary | ICD-10-CM

## 2022-09-06 ENCOUNTER — Other Ambulatory Visit: Payer: Self-pay | Admitting: Cardiovascular Disease

## 2022-09-07 MED ORDER — WARFARIN SODIUM 2 MG PO TABS: 2.0000 mg | ORAL_TABLET | Freq: Every day | ORAL | 0 refills | Status: DC

## 2022-09-08 NOTE — Progress Notes (Signed)
Post-Op Visit Note   Patient: Kristin Haas           Date of Birth: 1955/04/18           MRN: [id number] Visit Date: 09/04/2022 PCP: Miki Kins, FNP   Assessment & Plan:  Chief Complaint:  Chief Complaint  Patient presents with   Other     Folllow up fracture   Visit Diagnoses:  1. Closed 3-part fracture of proximal humerus, right, initial encounter     Plan: Patient is a 67 year old female who presents s/p right shoulder proximal humerus fracture ORIF on 4//24.  She is doing well.  She is doing a home exercise program along with just living her daily life.  She states that she has good function of the shoulder and only a little bit of discomfort with terminal forward flexion and going behind her back.  She is able to lay on her right shoulder at night.  She is okay lifting about half a gallon of milk without any issue.  On exam she has 30 degrees X rotation, 90 degrees abduction, 150 degrees forward elevation both passively and actively.  Good rotator cuff strength of supra, infra, subscap.  Axillary nerve intact with deltoid firing.  Incision is well-healed without evidence of infection or dehiscence.  2+ radial pulse of the operative extremity.  Plan is to have patient continue with home exercise program and follow-up with the office as needed.  She was counseled to avoid certain activities such as overhead lifting vigorously and significant lifting out away from her body.  She understands the risk of eventual avascular necrosis and she will return to the office if there is any increased crepitus or pain in the shoulder.  Follow-Up Instructions: Return if symptoms worsen or fail to improve.   Orders:  Orders Placed This Encounter  Procedures   XR Shoulder Right   No orders of the defined types were placed in this encounter.   Imaging: No results found.  PMFS History: Patient Active Problem List   Diagnosis Date Noted   Proximal humerus fracture 04/20/2022    Closed 3-part fracture of proximal humerus, right, initial encounter 04/17/2022   Depression 04/04/2022   IDA (iron deficiency anemia) 12/06/2021   Abnormal CT of the chest 12/06/2021   Symptomatic anemia 07/11/2021   A-fib (HCC) 02/04/2020   Supratherapeutic INR 02/04/2020   HTN (hypertension) 02/04/2020   HLD (hyperlipidemia) 02/04/2020   Past Medical History:  Diagnosis Date   Anxiety    Atrial fibrillation (HCC)    Complication of anesthesia    difficulty waking up   COPD (chronic obstructive pulmonary disease) (HCC)    Depression    Dysrhythmia    A-fib   Flushing 09/06/2015   Headache    Hypercholesteremia    Hypertension    Palpitation 09/06/2015   Rectus sheath hematoma, initial encounter 02/04/2020    Family History  Problem Relation Age of Onset   Hypertension Father    Lung cancer Father    Hypertension Sister    Uterine cancer Maternal Grandmother    Cervical cancer Paternal Grandmother    Lung cancer Son     Past Surgical History:  Procedure Laterality Date   HERNIA REPAIR     ORIF HUMERUS FRACTURE Right 04/20/2022   Procedure: OPEN REDUCTION INTERNAL  FIXATION (ORIF) RIGHT PROXIMAL HUMERUS FRACTURE & BONE MARROW ASPIRATE FROM ILIAC CREST;  Surgeon: Cammy Copa, MD;  Location: [facility] OR;  Service: Orthopedics;  Laterality:  Right;   TONSILLECTOMY     TUBAL LIGATION     Social History   Occupational History   Not on file  Tobacco Use   Smoking status: Every Day    Current packs/day: 0.50    Average packs/day: 0.5 packs/day for 50.0 years (25.0 ttl pk-yrs)    Types: Cigarettes   Smokeless tobacco: Never   Tobacco comments:    Patient down to 2 packs/week, which is approximately 1/2 of her previous intake.   Vaping Use   Vaping status: Never Used  Substance and Sexual Activity   Alcohol use: Yes    Comment: occasional/maybe once a year   Drug use: No   Sexual activity: Never

## 2022-09-22 ENCOUNTER — Other Ambulatory Visit: Payer: Self-pay | Admitting: Cardiovascular Disease

## 2022-10-05 ENCOUNTER — Ambulatory Visit (INDEPENDENT_AMBULATORY_CARE_PROVIDER_SITE_OTHER): Payer: Medicare PPO | Admitting: Family

## 2022-10-05 ENCOUNTER — Other Ambulatory Visit: Payer: Self-pay | Admitting: Cardiovascular Disease

## 2022-10-05 ENCOUNTER — Encounter: Payer: Self-pay | Admitting: Family

## 2022-10-05 VITALS — BP 120/74 | HR 52 | Ht 66.0 in | Wt 132.2 lb

## 2022-10-05 DIAGNOSIS — R5383 Other fatigue: Secondary | ICD-10-CM

## 2022-10-05 DIAGNOSIS — E559 Vitamin D deficiency, unspecified: Secondary | ICD-10-CM

## 2022-10-05 DIAGNOSIS — I10 Essential (primary) hypertension: Secondary | ICD-10-CM

## 2022-10-05 DIAGNOSIS — E782 Mixed hyperlipidemia: Secondary | ICD-10-CM

## 2022-10-05 DIAGNOSIS — I259 Chronic ischemic heart disease, unspecified: Secondary | ICD-10-CM | POA: Diagnosis not present

## 2022-10-05 DIAGNOSIS — R7303 Prediabetes: Secondary | ICD-10-CM | POA: Diagnosis not present

## 2022-10-05 DIAGNOSIS — E538 Deficiency of other specified B group vitamins: Secondary | ICD-10-CM

## 2022-10-05 NOTE — Assessment & Plan Note (Signed)
Checking labs today.  Continue current therapy for lipid control. Will modify as needed based on labwork results.  

## 2022-10-05 NOTE — Assessment & Plan Note (Signed)
Blood pressure well controlled with current medications.  Continue current therapy.  Will reassess at follow up.

## 2022-10-05 NOTE — Progress Notes (Signed)
Established Patient Office Visit  Subjective:  Patient ID: Kristin Haas, female    DOB: [birth year]  Age: 67 y.o. MRN: [id number]  Chief Complaint  Patient presents with   Follow-up    3 mo f/u    Patient is here today for her 3 months follow up.  She has been feeling fairly well since last appointment.   She does not have additional concerns to discuss today.   Labs are due today. She needs refills.   I have reviewed her active problem list, medication list, allergies, notes from last encounter, lab results\ for her appointment today.    No other concerns at this time.   Past Medical History:  Diagnosis Date   Anxiety    Atrial fibrillation (HCC)    Complication of anesthesia    difficulty waking up   COPD (chronic obstructive pulmonary disease) (HCC)    Depression    Dysrhythmia    A-fib   Flushing 09/06/2015   Headache    Hypercholesteremia    Hypertension    Palpitation 09/06/2015   Rectus sheath hematoma, initial encounter 02/04/2020    Past Surgical History:  Procedure Laterality Date   HERNIA REPAIR     ORIF HUMERUS FRACTURE Right 04/20/2022   Procedure: OPEN REDUCTION INTERNAL  FIXATION (ORIF) RIGHT PROXIMAL HUMERUS FRACTURE & BONE MARROW ASPIRATE FROM ILIAC CREST;  Surgeon: Cammy Copa, MD;  Location: [facility] OR;  Service: Orthopedics;  Laterality: Right;   TONSILLECTOMY     TUBAL LIGATION      Social History   Socioeconomic History   Marital status: Single    Spouse name: Not on file   Number of children: Not on file   Years of education: Not on file   Highest education level: Not on file  Occupational History   Not on file  Tobacco Use   Smoking status: Every Day    Current packs/day: 0.50    Average packs/day: 0.5 packs/day for 50.0 years (25.0 ttl pk-yrs)    Types: Cigarettes   Smokeless tobacco: Never   Tobacco comments:    Patient down to 2 packs/week, which is approximately 1/2 of her previous intake.   Vaping Use   Vaping  status: Never Used  Substance and Sexual Activity   Alcohol use: Yes    Comment: occasional/maybe once a year   Drug use: No   Sexual activity: Never  Other Topics Concern   Not on file  Social History Narrative   Patient used to be a Production designer, theatre/television/film in a warehouse.  Currently retired.  Active smoker.  No significant alcohol.   Social Determinants of Health   Financial Resource Strain: Not on file  Food Insecurity: No Food Insecurity (04/20/2022)   Hunger Vital Sign    Worried About Running Out of Food in the Last Year: Never true    Ran Out of Food in the Last Year: Never true  Transportation Needs: No Transportation Needs (04/20/2022)   PRAPARE - Administrator, Civil Service (Medical): No    Lack of Transportation (Non-Medical): No  Physical Activity: Not on file  Stress: Not on file  Social Connections: Not on file  Intimate Partner Violence: Not At Risk (04/20/2022)   Humiliation, Afraid, Rape, and Kick questionnaire    Fear of Current or Ex-Partner: No    Emotionally Abused: No    Physically Abused: No    Sexually Abused: No    Family History  Problem Relation Age of Onset  Hypertension Father    Lung cancer Father    Hypertension Sister    Uterine cancer Maternal Grandmother    Cervical cancer Paternal Grandmother    Lung cancer Son     Allergies  Allergen Reactions   Codeine Nausea And Vomiting    Review of Systems  Constitutional:  Positive for malaise/fatigue.  Psychiatric/Behavioral:  Negative for suicidal ideas. The patient is nervous/anxious.   All other systems reviewed and are negative.     Objective:   BP 120/74   Pulse (!) 52   Ht 5\' 6"  (1.676 m)   Wt 132 lb 3.2 oz (60 kg)   SpO2 96%   BMI 21.34 kg/m   Vitals:   10/05/22 1052  BP: 120/74  Pulse: (!) 52  Height: 5\' 6"  (1.676 m)  Weight: 132 lb 3.2 oz (60 kg)  SpO2: 96%  BMI (Calculated): 21.35    Physical Exam Vitals and nursing note reviewed.  Constitutional:       Appearance: Normal appearance. She is normal weight.  HENT:     Head: Normocephalic.  Eyes:     Pupils: Pupils are equal, round, and reactive to light.  Cardiovascular:     Rate and Rhythm: Normal rate.  Pulmonary:     Effort: Pulmonary effort is normal.  Neurological:     Mental Status: She is alert.      No results found for any visits on 10/05/22.  No results found for this or any previous visit (from the past 2160 hour(s)).     Assessment & Plan:   Problem List Items Addressed This Visit       Active Problems   HTN (hypertension)    Blood pressure well controlled with current medications.  Continue current therapy.  Will reassess at follow up.       Relevant Orders   CMP14+EGFR   CBC with Differential/Platelet   HLD (hyperlipidemia) - Primary    Checking labs today.  Continue current therapy for lipid control. Will modify as needed based on labwork results.       Relevant Orders   Lipid panel   CMP14+EGFR   CBC with Differential/Platelet   Other Visit Diagnoses     Vitamin D deficiency, unspecified       Checking labs today.  Will continue supplements as needed.   Relevant Orders   VITAMIN D 25 Hydroxy (Vit-D Deficiency, Fractures)   CMP14+EGFR   CBC with Differential/Platelet   B12 deficiency due to diet       Checking labs today.  Will continue supplements as needed.   Relevant Orders   CMP14+EGFR   Vitamin B12   CBC with Differential/Platelet   Other fatigue       Relevant Orders   CMP14+EGFR   TSH   CBC with Differential/Platelet   Prediabetes       Checking labs today Patient counseled on dietary choices and verbalized understanding.   Relevant Orders   CMP14+EGFR   Hemoglobin A1c   CBC with Differential/Platelet       Return in about 3 months (around 01/04/2023) for AWV.   Total time spent: 20 minutes  Miki Kins, FNP  10/05/2022   This document may have been prepared by [facility] Voice Recognition software and as such  may include unintentional dictation errors.

## 2022-10-06 ENCOUNTER — Inpatient Hospital Stay (HOSPITAL_BASED_OUTPATIENT_CLINIC_OR_DEPARTMENT_OTHER): Payer: Medicare PPO | Admitting: Nurse Practitioner

## 2022-10-06 ENCOUNTER — Inpatient Hospital Stay: Payer: Medicare PPO | Attending: Internal Medicine

## 2022-10-06 ENCOUNTER — Other Ambulatory Visit: Payer: Self-pay

## 2022-10-06 ENCOUNTER — Encounter: Payer: Self-pay | Admitting: Nurse Practitioner

## 2022-10-06 ENCOUNTER — Encounter: Payer: Self-pay | Admitting: Cardiovascular Disease

## 2022-10-06 ENCOUNTER — Inpatient Hospital Stay: Payer: Medicare PPO

## 2022-10-06 VITALS — BP 149/67 | HR 53

## 2022-10-06 VITALS — BP 143/75 | HR 55 | Temp 97.7°F | Wt 134.0 lb

## 2022-10-06 DIAGNOSIS — K5900 Constipation, unspecified: Secondary | ICD-10-CM | POA: Insufficient documentation

## 2022-10-06 DIAGNOSIS — D649 Anemia, unspecified: Secondary | ICD-10-CM

## 2022-10-06 DIAGNOSIS — R911 Solitary pulmonary nodule: Secondary | ICD-10-CM | POA: Insufficient documentation

## 2022-10-06 DIAGNOSIS — J449 Chronic obstructive pulmonary disease, unspecified: Secondary | ICD-10-CM | POA: Diagnosis not present

## 2022-10-06 DIAGNOSIS — I10 Essential (primary) hypertension: Secondary | ICD-10-CM | POA: Insufficient documentation

## 2022-10-06 DIAGNOSIS — Z7901 Long term (current) use of anticoagulants: Secondary | ICD-10-CM | POA: Insufficient documentation

## 2022-10-06 DIAGNOSIS — J439 Emphysema, unspecified: Secondary | ICD-10-CM | POA: Diagnosis not present

## 2022-10-06 DIAGNOSIS — D509 Iron deficiency anemia, unspecified: Secondary | ICD-10-CM | POA: Insufficient documentation

## 2022-10-06 DIAGNOSIS — N183 Chronic kidney disease, stage 3 unspecified: Secondary | ICD-10-CM | POA: Insufficient documentation

## 2022-10-06 DIAGNOSIS — R195 Other fecal abnormalities: Secondary | ICD-10-CM | POA: Diagnosis not present

## 2022-10-06 DIAGNOSIS — Z801 Family history of malignant neoplasm of trachea, bronchus and lung: Secondary | ICD-10-CM | POA: Insufficient documentation

## 2022-10-06 DIAGNOSIS — I4891 Unspecified atrial fibrillation: Secondary | ICD-10-CM | POA: Diagnosis not present

## 2022-10-06 DIAGNOSIS — Z79899 Other long term (current) drug therapy: Secondary | ICD-10-CM | POA: Insufficient documentation

## 2022-10-06 DIAGNOSIS — F17210 Nicotine dependence, cigarettes, uncomplicated: Secondary | ICD-10-CM | POA: Diagnosis not present

## 2022-10-06 DIAGNOSIS — I129 Hypertensive chronic kidney disease with stage 1 through stage 4 chronic kidney disease, or unspecified chronic kidney disease: Secondary | ICD-10-CM | POA: Diagnosis not present

## 2022-10-06 DIAGNOSIS — E7800 Pure hypercholesterolemia, unspecified: Secondary | ICD-10-CM | POA: Diagnosis not present

## 2022-10-06 DIAGNOSIS — Z87891 Personal history of nicotine dependence: Secondary | ICD-10-CM

## 2022-10-06 LAB — CMP14+EGFR
ALT: 23 IU/L (ref 0–32)
AST: 30 IU/L (ref 0–40)
Albumin: 4.6 g/dL (ref 3.9–4.9)
Alkaline Phosphatase: 112 IU/L (ref 44–121)
BUN/Creatinine Ratio: 20 (ref 12–28)
BUN: 21 mg/dL (ref 8–27)
Bilirubin Total: 0.3 mg/dL (ref 0.0–1.2)
CO2: 22 mmol/L (ref 20–29)
Calcium: 9.6 mg/dL (ref 8.7–10.3)
Chloride: 103 mmol/L (ref 96–106)
Creatinine, Ser: 1.07 mg/dL — ABNORMAL HIGH (ref 0.57–1.00)
Globulin, Total: 2.6 g/dL (ref 1.5–4.5)
Glucose: 93 mg/dL (ref 70–99)
Potassium: 4.5 mmol/L (ref 3.5–5.2)
Sodium: 140 mmol/L (ref 134–144)
Total Protein: 7.2 g/dL (ref 6.0–8.5)
eGFR: 57 mL/min/{1.73_m2} — ABNORMAL LOW (ref >59)

## 2022-10-06 LAB — LIPID PANEL
Chol/HDL Ratio: 2.7 ratio (ref 0.0–4.4)
Cholesterol, Total: 165 mg/dL (ref 100–199)
HDL: 61 mg/dL (ref >39)
LDL Chol Calc (NIH): 85 mg/dL (ref 0–99)
Triglycerides: 103 mg/dL (ref 0–149)
VLDL Cholesterol Cal: 19 mg/dL (ref 5–40)

## 2022-10-06 LAB — CMP (CANCER CENTER ONLY)
ALT: 21 U/L (ref 0–44)
AST: 21 U/L (ref 15–41)
Albumin: 3.9 g/dL (ref 3.5–5.0)
Alkaline Phosphatase: 78 U/L (ref 38–126)
Anion gap: 6 (ref 5–15)
BUN: 25 mg/dL — ABNORMAL HIGH (ref 8–23)
CO2: 25 mmol/L (ref 22–32)
Calcium: 8.8 mg/dL — ABNORMAL LOW (ref 8.9–10.3)
Chloride: 105 mmol/L (ref 98–111)
Creatinine: 0.89 mg/dL (ref 0.44–1.00)
GFR, Estimated: >60 mL/min (ref >60)
Glucose, Bld: 82 mg/dL (ref 70–99)
Potassium: 4 mmol/L (ref 3.5–5.1)
Sodium: 136 mmol/L (ref 135–145)
Total Bilirubin: 0.4 mg/dL (ref 0.3–1.2)
Total Protein: 6.8 g/dL (ref 6.5–8.1)

## 2022-10-06 LAB — CBC WITH DIFFERENTIAL (CANCER CENTER ONLY)
Abs Immature Granulocytes: 0.02 10*3/uL (ref 0.00–0.07)
Basophils Absolute: 0.1 10*3/uL (ref 0.0–0.1)
Basophils Relative: 1 %
Eosinophils Absolute: 0.1 10*3/uL (ref 0.0–0.5)
Eosinophils Relative: 2 %
HCT: 33.9 % — ABNORMAL LOW (ref 36.0–46.0)
Hemoglobin: 10.6 g/dL — ABNORMAL LOW (ref 12.0–15.0)
Immature Granulocytes: 0 %
Lymphocytes Relative: 25 %
Lymphs Abs: 1.8 10*3/uL (ref 0.7–4.0)
MCH: 25.4 pg — ABNORMAL LOW (ref 26.0–34.0)
MCHC: 31.3 g/dL (ref 30.0–36.0)
MCV: 81.3 fL (ref 80.0–100.0)
Monocytes Absolute: 0.6 10*3/uL (ref 0.1–1.0)
Monocytes Relative: 9 %
Neutro Abs: 4.5 10*3/uL (ref 1.7–7.7)
Neutrophils Relative %: 63 %
Platelet Count: 202 10*3/uL (ref 150–400)
RBC: 4.17 MIL/uL (ref 3.87–5.11)
RDW: 17.4 % — ABNORMAL HIGH (ref 11.5–15.5)
WBC Count: 7.1 10*3/uL (ref 4.0–10.5)
nRBC: 0 % (ref 0.0–0.2)

## 2022-10-06 LAB — HEMOGLOBIN A1C
Est. average glucose Bld gHb Est-mCnc: 120 mg/dL
Hgb A1c MFr Bld: 5.8 % — ABNORMAL HIGH (ref 4.8–5.6)

## 2022-10-06 LAB — VITAMIN D 25 HYDROXY (VIT D DEFICIENCY, FRACTURES): Vit D, 25-Hydroxy: 17.5 ng/mL — ABNORMAL LOW (ref 30.0–100.0)

## 2022-10-06 LAB — PROTIME-INR
INR: 2.2 — ABNORMAL HIGH (ref 0.9–1.2)
Prothrombin Time: 23.1 s — ABNORMAL HIGH (ref 9.1–12.0)

## 2022-10-06 LAB — VITAMIN B12: Vitamin B-12: 335 pg/mL (ref 232–1245)

## 2022-10-06 LAB — FERRITIN: Ferritin: 7 ng/mL — ABNORMAL LOW (ref 11–307)

## 2022-10-06 LAB — TSH: TSH: 1.92 u[IU]/mL (ref 0.450–4.500)

## 2022-10-06 LAB — IRON AND TIBC
Iron: 30 ug/dL (ref 28–170)
Saturation Ratios: 6 % — ABNORMAL LOW (ref 10.4–31.8)
TIBC: 486 ug/dL — ABNORMAL HIGH (ref 250–450)
UIBC: 456 ug/dL

## 2022-10-06 MED ORDER — SODIUM CHLORIDE 0.9 % IV SOLN
200.0000 mg | Freq: Once | INTRAVENOUS | Status: AC
  Administered 2022-10-06: 200 mg via INTRAVENOUS
  Filled 2022-10-06: qty 200

## 2022-10-06 MED ORDER — SODIUM CHLORIDE 0.9 % IV SOLN
Freq: Once | INTRAVENOUS | Status: AC
  Filled 2022-10-06: qty 250

## 2022-10-06 MED ORDER — VITAMIN D (ERGOCALCIFEROL) 1.25 MG (50000 UNIT) PO CAPS: 50000.0000 [IU] | ORAL_CAPSULE | ORAL | 3 refills | Status: AC

## 2022-10-06 NOTE — Progress Notes (Signed)
[facility] CONSULT NOTE  Patient Care Team: Miki Kins, FNP as PCP - General (Family Medicine) Earna Coder, MD as Consulting Physician (Oncology)  CHIEF COMPLAINTS/PURPOSE OF CONSULTATION: ANEMIA   HEMATOLOGY HISTORY  # JUNE 2023- ANEMIA- [Hb; 8- MCV-platelets/WBC- WNL; Iron sat: 4%; ferritin: 13 [LL of N- 15] creat 1.1; CT/US- ;  EGD/colonoscopy-never  # A.fib on coumadin [Dr.Khan; Hx of Hematoma- JAN 2022- noted to have anemia at that time.  Hemoglobin around 7.  Admitted to hospital however did not have any blood transfusions on.  Patient was taken off Coumadin at the time for 3 months.  She is currently on Coumadin [Dr.Khan]]  # active smoker;   HISTORY OF PRESENTING ILLNESS: Alone.  Ambulating independently.  Kristin Haas 67 y.o. pleasant female with history of iron deficiency anemia, stroke and afib, on coumadin, who returns to clinic for follow up and consideration of IV iron. She is taking oral slow iron. Tolerating well. Constipation is unchanged. Stools are dark. Denies additional weight loss. Continues to maintain increased intake of iron rich foods. In interim, she fell at home on slick deck and suffered fracture of proximal humerus of right arm. She underwent surgery and has healed well. No vaginal bleeding or hematuria. Denies any neurologic complaints. Denies recent fevers or illnesses. Denies any easy bleeding or bruising. No melena or hematochezia. No pica or restless leg. Reports good appetite and denies weight loss. Denies chest pain. Denies any nausea, vomiting, constipation, or diarrhea. Denies urinary complaints. Patient offers no further specific complaints today.    Review of Systems  Constitutional:  Positive for malaise/fatigue. Negative for chills, diaphoresis, fever and weight loss.  HENT:  Negative for nosebleeds and sore throat.        Poor dentition  Eyes:  Negative for double vision.  Respiratory:  Negative for cough,  hemoptysis, sputum production, shortness of breath and wheezing.   Cardiovascular:  Negative for chest pain, palpitations, orthopnea and leg swelling.  Gastrointestinal:  Negative for abdominal pain, blood in stool, constipation, diarrhea, heartburn, melena, nausea and vomiting.  Genitourinary:  Negative for dysuria, frequency and urgency.  Musculoskeletal:  Negative for back pain and joint pain.  Skin: Negative.  Negative for itching and rash.  Neurological:  Negative for dizziness, tingling, focal weakness, weakness and headaches.  Endo/Heme/Allergies:  Does not bruise/bleed easily.  Psychiatric/Behavioral:  Negative for depression. The patient is not nervous/anxious and does not have insomnia.    MEDICAL HISTORY:  Past Medical History:  Diagnosis Date   Anxiety    Atrial fibrillation (HCC)    Complication of anesthesia    difficulty waking up   COPD (chronic obstructive pulmonary disease) (HCC)    Depression    Dysrhythmia    A-fib   Flushing 09/06/2015   Headache    Hypercholesteremia    Hypertension    Palpitation 09/06/2015   Rectus sheath hematoma, initial encounter 02/04/2020    SURGICAL HISTORY: Past Surgical History:  Procedure Laterality Date   HERNIA REPAIR     ORIF HUMERUS FRACTURE Right 04/20/2022   Procedure: OPEN REDUCTION INTERNAL  FIXATION (ORIF) RIGHT PROXIMAL HUMERUS FRACTURE & BONE MARROW ASPIRATE FROM ILIAC CREST;  Surgeon: Cammy Copa, MD;  Location: [facility] OR;  Service: Orthopedics;  Laterality: Right;   TONSILLECTOMY     TUBAL LIGATION      SOCIAL HISTORY: Social History   Socioeconomic History   Marital status: Single    Spouse name: Not on file  Number of children: Not on file   Years of education: Not on file   Highest education level: Not on file  Occupational History   Not on file  Tobacco Use   Smoking status: Every Day    Current packs/day: 0.50    Average packs/day: 0.5 packs/day for 50.0 years (25.0 ttl pk-yrs)    Types:  Cigarettes   Smokeless tobacco: Never   Tobacco comments:    Patient down to 2 packs/week, which is approximately 1/2 of her previous intake.   Vaping Use   Vaping status: Never Used  Substance and Sexual Activity   Alcohol use: Yes    Comment: occasional/maybe once a year   Drug use: No   Sexual activity: Never  Other Topics Concern   Not on file  Social History Narrative   Patient used to be a Production designer, theatre/television/film in a warehouse.  Currently retired.  Active smoker.  No significant alcohol.   Social Determinants of Health   Financial Resource Strain: Not on file  Food Insecurity: No Food Insecurity (04/20/2022)   Hunger Vital Sign    Worried About Running Out of Food in the Last Year: Never true    Ran Out of Food in the Last Year: Never true  Transportation Needs: No Transportation Needs (04/20/2022)   PRAPARE - Administrator, Civil Service (Medical): No    Lack of Transportation (Non-Medical): No  Physical Activity: Not on file  Stress: Not on file  Social Connections: Not on file  Intimate Partner Violence: Not At Risk (04/20/2022)   Humiliation, Afraid, Rape, and Kick questionnaire    Fear of Current or Ex-Partner: No    Emotionally Abused: No    Physically Abused: No    Sexually Abused: No    FAMILY HISTORY: Family History  Problem Relation Age of Onset   Hypertension Father    Lung cancer Father    Hypertension Sister    Uterine cancer Maternal Grandmother    Cervical cancer Paternal Grandmother    Lung cancer Son   Son diagnosed with lung cancer; never smoked. Passed away at age 25.   ALLERGIES:  is allergic to codeine.  MEDICATIONS:  Current Outpatient Medications  Medication Sig Dispense Refill   acetaminophen (TYLENOL) 500 MG tablet Take 500 mg by mouth every 6 (six) hours as needed for mild pain.     amiodarone (PACERONE) 200 MG tablet Take 1 tablet by mouth once daily 30 tablet 0   busPIRone (BUSPAR) 5 MG tablet Take 1 tablet by mouth once daily 30 tablet  0   citalopram (CELEXA) 40 MG tablet Take 1 tablet by mouth once daily 90 tablet 0   Cyanocobalamin (B-12) 1000 MCG SUBL Place 1,000 mcg under the tongue once a week. B-12 Drops     ferrous sulfate 324 MG TBEC Take 324 mg by mouth once a week.     losartan (COZAAR) 25 MG tablet Take 1 tablet (25 mg total) by mouth daily. 30 tablet 11   metoprolol succinate (TOPROL-XL) 25 MG 24 hr tablet Take 1 tablet by mouth once daily 90 tablet 0   rosuvastatin (CRESTOR) 40 MG tablet Take 1 tablet (40 mg total) by mouth every evening. 30 tablet 2   Vitamin D, Ergocalciferol, (DRISDOL) 1.25 MG (50000 UNIT) CAPS capsule Take 1 capsule (50,000 Units total) by mouth every 7 (seven) days. 12 capsule 3   warfarin (COUMADIN) 2 MG tablet Take 1 tablet (2 mg total) by mouth daily. 90 tablet  0   No current facility-administered medications for this visit.     Marland Kitchen  PHYSICAL EXAMINATION: Vitals:   10/06/22 1415  BP: (!) 143/75  Pulse: (!) 55  Temp: 97.7 F (36.5 C)  SpO2: 100%   Filed Weights   10/06/22 1415  Weight: 134 lb (60.8 kg)   Physical Exam Vitals reviewed.  Constitutional:      Appearance: She is not ill-appearing.  HENT:     Head: Normocephalic and atraumatic.  Cardiovascular:     Rate and Rhythm: Normal rate and regular rhythm.  Pulmonary:     Effort: No respiratory distress.     Comments: Decreased breath sounds bilaterally.  Abdominal:     General: There is no distension.     Palpations: Abdomen is soft.     Tenderness: There is no abdominal tenderness.  Musculoskeletal:        General: No deformity.  Skin:    General: Skin is warm.     Coloration: Skin is not pale.  Neurological:     Mental Status: She is alert and oriented to person, place, and time.  Psychiatric:        Mood and Affect: Mood normal.        Behavior: Behavior normal.      LABORATORY DATA:  I have reviewed the data as listed Lab Results  Component Value Date   WBC 7.1 10/06/2022   HGB 10.6 (L)  10/06/2022   HCT 33.9 (L) 10/06/2022   MCV 81.3 10/06/2022   PLT 202 10/06/2022   Recent Labs    12/05/21 1305 04/05/22 1326 07/04/22 1227 10/05/22 1124 10/06/22 1405  NA 134* 138 137 140 136  K 3.8 4.4 4.6 4.5 4.0  CL 103 105 101 103 105  CO2 25 25 22 22 25   GLUCOSE 166* 98 98 93 82  BUN 18 19 19 21  25*  CREATININE 1.49* 1.05* 0.98 1.07* 0.89  CALCIUM 8.9 8.6* 9.5 9.6 8.8*  GFRNONAA 38* 58*  --   --  >60  PROT 7.2 7.1 6.6 7.2 6.8  ALBUMIN 3.8 3.9 4.4 4.6 3.9  AST 88* 43* 22 30 21   ALT 100* 43 22 23 21   ALKPHOS 111 92 109 112 78  BILITOT 0.3 0.5 0.2 0.3 0.4   Iron/TIBC/Ferritin/ %Sat    Component Value Date/Time   IRON 50 04/05/2022 1326   TIBC 476 (H) 04/05/2022 1326   FERRITIN 11 04/05/2022 1326   IRONPCTSAT 11 04/05/2022 1326      No results found.  ASSESSMENT & PLAN:   # Iron Deficiency Anemia- JUNE 2023- [PCP; Hb; 8--platelets/WBC- WNL; Iron sat: 4%; ferritin: 13 [LL of N- 15] secondary to to iron deficiency. s/p venofer in 2023. Now on gentle iron every 3 days. Tolerating well. Hemoglobin is stable but persistently decreased at 10.6. Iron studies pending at time of visit. Recommend IV Venofer today.   # Etiology of iron deficiency- reviewed recommendation for GI evaluation including colonoscopy, EGD, possible capsule study. She declines citing concern for perforation. I encouraged her to consider noninvasive colon cancer screening options for now as this may better help guide her decision making. She will discuss with pcp.   # History of lung nodule/opacity active smoker -CT scan January 2024 -left upper lobe lung resolved.  Previous right upper lobe lung nodule- improved. Repeat CT Jan 2024 confirmed stability. No additional imaging was recommended.    # A.fib/ hx of stroke on  Coumadin [Dr.Khan; cardiology]   # CKD-  stage III- stable.     # Active smoker-counseled to quit. Referred to lung cancer screening program.    # Incidental findings on Imaging  CT  2024: emphysema; atherosclerosis. She can follow up with pcp for management    DISPOSITION: Venofer today 4 months- labs (cbc, cmp, ferritin, iron studies), Dr Donneta Romberg, +/- venofer- la  No problem-specific Assessment & Plan notes found for this encounter.   All questions were answered. The patient knows to call the clinic with any problems, questions or concerns.  Alinda Dooms, NP 10/06/2022

## 2022-10-13 ENCOUNTER — Ambulatory Visit: Payer: Medicare PPO | Admitting: Cardiovascular Disease

## 2022-10-21 ENCOUNTER — Other Ambulatory Visit: Payer: Self-pay | Admitting: Cardiovascular Disease

## 2022-11-10 ENCOUNTER — Other Ambulatory Visit: Payer: Self-pay | Admitting: Cardiovascular Disease

## 2022-11-21 ENCOUNTER — Other Ambulatory Visit: Payer: Self-pay | Admitting: Family

## 2022-11-22 ENCOUNTER — Other Ambulatory Visit: Payer: Self-pay | Admitting: Cardiovascular Disease

## 2022-12-05 ENCOUNTER — Other Ambulatory Visit: Payer: Self-pay | Admitting: Cardiovascular Disease

## 2022-12-05 ENCOUNTER — Encounter: Payer: Self-pay | Admitting: Cardiovascular Disease

## 2022-12-05 ENCOUNTER — Ambulatory Visit: Payer: Medicare PPO | Admitting: Cardiovascular Disease

## 2022-12-05 VITALS — BP 142/73 | HR 55 | Ht 66.0 in | Wt 138.4 lb

## 2022-12-05 DIAGNOSIS — I259 Chronic ischemic heart disease, unspecified: Secondary | ICD-10-CM | POA: Diagnosis not present

## 2022-12-05 DIAGNOSIS — E782 Mixed hyperlipidemia: Secondary | ICD-10-CM

## 2022-12-05 DIAGNOSIS — I480 Paroxysmal atrial fibrillation: Secondary | ICD-10-CM | POA: Diagnosis not present

## 2022-12-05 DIAGNOSIS — I10 Essential (primary) hypertension: Secondary | ICD-10-CM

## 2022-12-05 DIAGNOSIS — M80021S Age-related osteoporosis with current pathological fracture, right humerus, sequela: Secondary | ICD-10-CM | POA: Diagnosis not present

## 2022-12-05 MED ORDER — LOSARTAN POTASSIUM 50 MG PO TABS: 50.0000 mg | ORAL_TABLET | Freq: Two times a day (BID) | ORAL | 11 refills | Status: DC

## 2022-12-05 NOTE — Progress Notes (Signed)
Cardiology Office Note   Date:  12/05/2022   ID:  Kristin Haas, DOB 1955-06-06, MRN [id number]  PCP:  Miki Kins, FNP  Cardiologist:  Adrian Blackwater, MD      History of Present Illness: Kristin Haas is a 67 y.o. female who presents for  Chief Complaint  Patient presents with  . Follow-up    Doing fine     Past Medical History:  Diagnosis Date  . Anxiety   . Atrial fibrillation (HCC)   . Complication of anesthesia    difficulty waking up  . COPD (chronic obstructive pulmonary disease) (HCC)   . Depression   . Dysrhythmia    A-fib  . Flushing 09/06/2015  . Headache   . Hypercholesteremia   . Hypertension   . Palpitation 09/06/2015  . Rectus sheath hematoma, initial encounter 02/04/2020     Past Surgical History:  Procedure Laterality Date  . HERNIA REPAIR    . ORIF HUMERUS FRACTURE Right 04/20/2022   Procedure: OPEN REDUCTION INTERNAL  FIXATION (ORIF) RIGHT PROXIMAL HUMERUS FRACTURE & BONE MARROW ASPIRATE FROM ILIAC CREST;  Surgeon: Cammy Copa, MD;  Location: [facility] OR;  Service: Orthopedics;  Laterality: Right;  . TONSILLECTOMY    . TUBAL LIGATION       Current Outpatient Medications  Medication Sig Dispense Refill  . acetaminophen (TYLENOL) 500 MG tablet Take 500 mg by mouth every 6 (six) hours as needed for mild pain.    Marland Kitchen amiodarone (PACERONE) 200 MG tablet Take 1 tablet by mouth once daily 30 tablet 0  . busPIRone (BUSPAR) 5 MG tablet Take 1 tablet by mouth once daily 30 tablet 0  . citalopram (CELEXA) 40 MG tablet Take 1 tablet by mouth once daily 90 tablet 0  . Cyanocobalamin (B-12) 1000 MCG SUBL Place 1,000 mcg under the tongue once a week. B-12 Drops    . ferrous sulfate 324 MG TBEC Take 324 mg by mouth once a week.    . losartan (COZAAR) 50 MG tablet Take 1 tablet (50 mg total) by mouth 2 (two) times daily. 60 tablet 11  . metoprolol succinate (TOPROL-XL) 25 MG 24 hr tablet Take 1 tablet by mouth once daily 90 tablet 0  .  rosuvastatin (CRESTOR) 40 MG tablet Take 1 tablet (40 mg total) by mouth every evening. 30 tablet 2  . Vitamin D, Ergocalciferol, (DRISDOL) 1.25 MG (50000 UNIT) CAPS capsule Take 1 capsule (50,000 Units total) by mouth every 7 (seven) days. 12 capsule 3  . warfarin (COUMADIN) 2 MG tablet Take 1 tablet (2 mg total) by mouth daily. 90 tablet 0   No current facility-administered medications for this visit.    Allergies:   Codeine    Social History:   reports that she has been smoking cigarettes. She has a 25 pack-year smoking history. She has never used smokeless tobacco. She reports current alcohol use. She reports that she does not use drugs.   Family History:  family history includes Cervical cancer in her paternal grandmother; Hypertension in her father and sister; Lung cancer in her father and son; Uterine cancer in her maternal grandmother.    ROS:     Review of Systems  Constitutional: Negative.   HENT: Negative.    Eyes: Negative.   Respiratory: Negative.    Gastrointestinal: Negative.   Genitourinary: Negative.   Musculoskeletal: Negative.   Skin: Negative.   Neurological: Negative.   Endo/Heme/Allergies: Negative.   Psychiatric/Behavioral: Negative.    All  other systems reviewed and are negative.     All other systems are reviewed and negative.    PHYSICAL EXAM: VS:  BP (!) 142/73   Pulse (!) 55   Ht 5\' 6"  (1.676 m)   Wt 138 lb 6.4 oz (62.8 kg)   SpO2 99%   BMI 22.34 kg/m  , BMI Body mass index is 22.34 kg/m. Last weight:  Wt Readings from Last 3 Encounters:  12/05/22 138 lb 6.4 oz (62.8 kg)  10/06/22 134 lb (60.8 kg)  10/05/22 132 lb 3.2 oz (60 kg)     Physical Exam Constitutional:      Appearance: Normal appearance.  Cardiovascular:     Rate and Rhythm: Normal rate and regular rhythm.     Heart sounds: Normal heart sounds.  Pulmonary:     Effort: Pulmonary effort is normal.     Breath sounds: Normal breath sounds.  Musculoskeletal:     Right  lower leg: No edema.     Left lower leg: No edema.  Neurological:     Mental Status: She is alert.      EKG:   Recent Labs: 10/05/2022: TSH 1.920 10/06/2022: ALT 21; BUN 25; Creatinine 0.89; Hemoglobin 10.6; Platelet Count 202; Potassium 4.0; Sodium 136    Lipid Panel    Component Value Date/Time   CHOL 165 10/05/2022 1124   TRIG 103 10/05/2022 1124   HDL 61 10/05/2022 1124   CHOLHDL 2.7 10/05/2022 1124   CHOLHDL 4.6 09/06/2015 1324   VLDL 29 09/06/2015 1324   LDLCALC 85 10/05/2022 1124      Other studies Reviewed: Additional studies/ records that were reviewed today include:  Review of the above records demonstrates:       No data to display            ASSESSMENT AND PLAN:    ICD-10-CM   1. Paroxysmal atrial fibrillation (HCC)  I48.0 losartan (COZAAR) 50 MG tablet   Remains in sinus rhythm on exam    2. Primary hypertension  I10 losartan (COZAAR) 50 MG tablet   Repeat blood pressure was 150/80 thus will change losartan from 25 mg once a day to 50 mg once a day.    3. Mixed hyperlipidemia  E78.2 losartan (COZAAR) 50 MG tablet    4. 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    Total time spent: 30 minutes  Signed,  Adrian Blackwater, MD  12/05/2022 11:07 AM    [facility]

## 2022-12-06 ENCOUNTER — Encounter: Payer: Self-pay | Admitting: Cardiovascular Disease

## 2022-12-06 LAB — PROTIME-INR
INR: 2.9 — ABNORMAL HIGH (ref 0.9–1.2)
Prothrombin Time: 30.3 s — ABNORMAL HIGH (ref 9.1–12.0)

## 2022-12-07 ENCOUNTER — Encounter: Payer: Self-pay | Admitting: Cardiovascular Disease

## 2022-12-18 ENCOUNTER — Other Ambulatory Visit: Payer: Self-pay | Admitting: Cardiovascular Disease

## 2023-01-04 ENCOUNTER — Ambulatory Visit: Payer: Medicare PPO | Admitting: Family

## 2023-01-04 ENCOUNTER — Encounter: Payer: Self-pay | Admitting: Family

## 2023-01-04 VITALS — BP 124/65 | HR 55 | Wt 134.6 lb

## 2023-01-04 DIAGNOSIS — Z0130 Encounter for examination of blood pressure without abnormal findings: Secondary | ICD-10-CM

## 2023-01-04 DIAGNOSIS — Z0000 Encounter for general adult medical examination without abnormal findings: Secondary | ICD-10-CM

## 2023-01-04 MED ORDER — ALENDRONATE SODIUM 70 MG PO TABS: 70.0000 mg | ORAL_TABLET | ORAL | 11 refills | Status: DC

## 2023-01-04 MED ORDER — CICLOPIROX 8 % EX SOLN: Freq: Every day | CUTANEOUS | 0 refills | Status: DC

## 2023-01-15 ENCOUNTER — Other Ambulatory Visit: Payer: Self-pay | Admitting: Cardiovascular Disease

## 2023-01-16 ENCOUNTER — Encounter: Payer: Self-pay | Admitting: Family

## 2023-01-18 ENCOUNTER — Other Ambulatory Visit: Payer: Self-pay | Admitting: Cardiovascular Disease

## 2023-01-18 MED ORDER — BUSPIRONE HCL 5 MG PO TABS: 5.0000 mg | ORAL_TABLET | Freq: Every day | ORAL | 0 refills | Status: DC

## 2023-01-18 MED ORDER — AMIODARONE HCL 200 MG PO TABS: 200.0000 mg | ORAL_TABLET | Freq: Every day | ORAL | 0 refills | Status: DC

## 2023-01-29 ENCOUNTER — Other Ambulatory Visit: Payer: Self-pay | Admitting: Family

## 2023-01-30 ENCOUNTER — Other Ambulatory Visit: Payer: Self-pay | Admitting: Family

## 2023-01-30 MED ORDER — CICLOPIROX 8 % EX SOLN: Freq: Every day | CUTANEOUS | 0 refills | Status: AC

## 2023-02-02 NOTE — Progress Notes (Unsigned)
 Erroneous encounter

## 2023-02-05 ENCOUNTER — Inpatient Hospital Stay: Payer: Medicare PPO

## 2023-02-05 ENCOUNTER — Encounter: Payer: Self-pay | Admitting: Internal Medicine

## 2023-02-05 ENCOUNTER — Inpatient Hospital Stay: Payer: Medicare PPO | Attending: Internal Medicine

## 2023-02-05 ENCOUNTER — Inpatient Hospital Stay (HOSPITAL_BASED_OUTPATIENT_CLINIC_OR_DEPARTMENT_OTHER): Payer: Medicare PPO | Admitting: Internal Medicine

## 2023-02-05 VITALS — BP 119/64 | HR 55 | Temp 97.1°F | Resp 16 | Wt 138.6 lb

## 2023-02-05 DIAGNOSIS — N183 Chronic kidney disease, stage 3 unspecified: Secondary | ICD-10-CM | POA: Insufficient documentation

## 2023-02-05 DIAGNOSIS — I4891 Unspecified atrial fibrillation: Secondary | ICD-10-CM | POA: Diagnosis not present

## 2023-02-05 DIAGNOSIS — F17210 Nicotine dependence, cigarettes, uncomplicated: Secondary | ICD-10-CM | POA: Diagnosis not present

## 2023-02-05 DIAGNOSIS — D509 Iron deficiency anemia, unspecified: Secondary | ICD-10-CM | POA: Diagnosis not present

## 2023-02-05 DIAGNOSIS — Z7901 Long term (current) use of anticoagulants: Secondary | ICD-10-CM | POA: Insufficient documentation

## 2023-02-05 DIAGNOSIS — D649 Anemia, unspecified: Secondary | ICD-10-CM

## 2023-02-05 DIAGNOSIS — Z8049 Family history of malignant neoplasm of other genital organs: Secondary | ICD-10-CM | POA: Diagnosis not present

## 2023-02-05 DIAGNOSIS — Z801 Family history of malignant neoplasm of trachea, bronchus and lung: Secondary | ICD-10-CM | POA: Diagnosis not present

## 2023-02-05 DIAGNOSIS — R918 Other nonspecific abnormal finding of lung field: Secondary | ICD-10-CM | POA: Diagnosis not present

## 2023-02-05 DIAGNOSIS — Z8673 Personal history of transient ischemic attack (TIA), and cerebral infarction without residual deficits: Secondary | ICD-10-CM | POA: Insufficient documentation

## 2023-02-05 LAB — CBC WITH DIFFERENTIAL/PLATELET
Abs Immature Granulocytes: 0.05 10*3/uL (ref 0.00–0.07)
Basophils Absolute: 0.1 10*3/uL (ref 0.0–0.1)
Basophils Relative: 1 %
Eosinophils Absolute: 0.1 10*3/uL (ref 0.0–0.5)
Eosinophils Relative: 1 %
HCT: 41.5 % (ref 36.0–46.0)
Hemoglobin: 13.8 g/dL (ref 12.0–15.0)
Immature Granulocytes: 1 %
Lymphocytes Relative: 23 %
Lymphs Abs: 2 10*3/uL (ref 0.7–4.0)
MCH: 29.2 pg (ref 26.0–34.0)
MCHC: 33.3 g/dL (ref 30.0–36.0)
MCV: 87.9 fL (ref 80.0–100.0)
Monocytes Absolute: 0.4 10*3/uL (ref 0.1–1.0)
Monocytes Relative: 5 %
Neutro Abs: 5.7 10*3/uL (ref 1.7–7.7)
Neutrophils Relative %: 69 %
Platelets: 202 10*3/uL (ref 150–400)
RBC: 4.72 MIL/uL (ref 3.87–5.11)
RDW: 14.1 % (ref 11.5–15.5)
WBC: 8.3 10*3/uL (ref 4.0–10.5)
nRBC: 0 % (ref 0.0–0.2)

## 2023-02-05 LAB — COMPREHENSIVE METABOLIC PANEL
ALT: 31 U/L (ref 0–44)
AST: 27 U/L (ref 15–41)
Albumin: 4.1 g/dL (ref 3.5–5.0)
Alkaline Phosphatase: 81 U/L (ref 38–126)
Anion gap: 9 (ref 5–15)
BUN: 21 mg/dL (ref 8–23)
CO2: 24 mmol/L (ref 22–32)
Calcium: 8.9 mg/dL (ref 8.9–10.3)
Chloride: 103 mmol/L (ref 98–111)
Creatinine, Ser: 1.01 mg/dL — ABNORMAL HIGH (ref 0.44–1.00)
GFR, Estimated: >60 mL/min (ref >60)
Glucose, Bld: 135 mg/dL — ABNORMAL HIGH (ref 70–99)
Potassium: 3.7 mmol/L (ref 3.5–5.1)
Sodium: 136 mmol/L (ref 135–145)
Total Bilirubin: 0.5 mg/dL (ref 0.0–1.2)
Total Protein: 7.5 g/dL (ref 6.5–8.1)

## 2023-02-05 LAB — IRON AND TIBC
Iron: 76 ug/dL (ref 28–170)
Saturation Ratios: 19 % (ref 10.4–31.8)
TIBC: 407 ug/dL (ref 250–450)
UIBC: 331 ug/dL

## 2023-02-05 LAB — FERRITIN: Ferritin: 35 ng/mL (ref 11–307)

## 2023-02-05 NOTE — Progress Notes (Signed)
Follow up for anemia. Pt denies any symptoms of anemia. Denies dyspnea or dizziness. No blood in stool. Takes coumadin 3 mg daily.

## 2023-02-05 NOTE — Progress Notes (Signed)
[facility] CONSULT NOTE  Patient Care Team: Miki Kins, FNP as PCP - General (Family Medicine) Earna Coder, MD as Consulting Physician (Oncology)  CHIEF COMPLAINTS/PURPOSE OF CONSULTATION: ANEMIA   HEMATOLOGY HISTORY  # JUNE 2023- ANEMIA[Hb; 8- MCV-platelets/WBC- WNL; Iron sat: 4%; ferritin: 13 [LL of N- 15] creat 1.1; CT/US- ;  EGD/colonoscopy-never  # A.fib on coumadin [Dr.Khan; Hx of Hematoma- JAN 2022- noted to have anemia at that time.  Hemoglobin around 7.  Admitted to hospital however did not have any blood transfusions on.  Patient was taken off Coumadin at the time for 3 months.  She is currently on Coumadin [Dr.Khan]]  # active smoker;   HISTORY OF PRESENTING ILLNESS: Alone.  Ambulating independently.  Kristin Haas 68 y.o.  female pleasant with a history of iron deficient anemia; history of stroke/A-fib on Coumadin is here for follow-up.   Today pt denies any symptoms of anemia. Denies dyspnea or dizziness. No blood in stool. Takes coumadin 3 mg daily.   Denies fatigue. No dizziness or dyspnea.    Review of Systems  Constitutional:  Negative for chills, diaphoresis and fever.  HENT:  Negative for nosebleeds and sore throat.   Eyes:  Negative for double vision.  Respiratory:  Negative for cough, hemoptysis, sputum production, shortness of breath and wheezing.   Cardiovascular:  Negative for chest pain, palpitations, orthopnea and leg swelling.  Gastrointestinal:  Negative for abdominal pain, blood in stool, constipation, diarrhea, heartburn, melena, nausea and vomiting.  Genitourinary:  Negative for dysuria, frequency and urgency.  Musculoskeletal:  Negative for back pain and joint pain.  Skin: Negative.  Negative for itching and rash.  Neurological:  Positive for weakness. Negative for dizziness, tingling, focal weakness and headaches.  Endo/Heme/Allergies:  Does not bruise/bleed easily.  Psychiatric/Behavioral:  Negative for  depression. The patient is not nervous/anxious and does not have insomnia.    MEDICAL HISTORY:  Past Medical History:  Diagnosis Date   Anxiety    Atrial fibrillation (HCC)    Complication of anesthesia    difficulty waking up   COPD (chronic obstructive pulmonary disease) (HCC)    Depression    Dysrhythmia    A-fib   Flushing 09/06/2015   Headache    Hypercholesteremia    Hypertension    Palpitation 09/06/2015   Rectus sheath hematoma, initial encounter 02/04/2020    SURGICAL HISTORY: Past Surgical History:  Procedure Laterality Date   HERNIA REPAIR     ORIF HUMERUS FRACTURE Right 04/20/2022   Procedure: OPEN REDUCTION INTERNAL  FIXATION (ORIF) RIGHT PROXIMAL HUMERUS FRACTURE & BONE MARROW ASPIRATE FROM ILIAC CREST;  Surgeon: Cammy Copa, MD;  Location: [facility] OR;  Service: Orthopedics;  Laterality: Right;   TONSILLECTOMY     TUBAL LIGATION      SOCIAL HISTORY: Social History   Socioeconomic History   Marital status: Single    Spouse name: Not on file   Number of children: Not on file   Years of education: Not on file   Highest education level: Not on file  Occupational History   Not on file  Tobacco Use   Smoking status: Every Day    Current packs/day: 0.50    Average packs/day: 0.5 packs/day for 50.0 years (25.0 ttl pk-yrs)    Types: Cigarettes   Smokeless tobacco: Never   Tobacco comments:    Patient down to 2 packs/week, which is approximately 1/2 of her previous intake.   Vaping Use   Vaping status:  Never Used  Substance and Sexual Activity   Alcohol use: Yes    Comment: occasional/maybe once a year   Drug use: No   Sexual activity: Never  Other Topics Concern   Not on file  Social History Narrative   Patient used to be a Production designer, theatre/television/film in a warehouse.  Currently retired.  Active smoker.  No significant alcohol.   Social Drivers of Corporate investment banker Strain: Low Risk  (01/04/2023)   Overall Financial Resource Strain (CARDIA)    Difficulty of  Paying Living Expenses: Not very hard  Food Insecurity: No Food Insecurity (01/04/2023)   Hunger Vital Sign    Worried About Running Out of Food in the Last Year: Never true    Ran Out of Food in the Last Year: Never true  Transportation Needs: No Transportation Needs (01/04/2023)   PRAPARE - Administrator, Civil Service (Medical): No    Lack of Transportation (Non-Medical): No  Physical Activity: Inactive (01/04/2023)   Exercise Vital Sign    Days of Exercise per Week: 0 days    Minutes of Exercise per Session: 0 min  Stress: No Stress Concern Present (01/04/2023)   [employer] of Occupational Health - Occupational Stress Questionnaire    Feeling of Stress : Only a little  Social Connections: Moderately Isolated (01/04/2023)   Social Connection and Isolation Panel [NHANES]    Frequency of Communication with Friends and Family: More than three times a week    Frequency of Social Gatherings with Friends and Family: More than three times a week    Attends Religious Services: More than 4 times per year    Active Member of [employer] or Organizations: No    Attends Banker Meetings: Never    Marital Status: Widowed  Intimate Partner Violence: Not At Risk (01/04/2023)   Humiliation, Afraid, Rape, and Kick questionnaire    Fear of Current or Ex-Partner: No    Emotionally Abused: No    Physically Abused: No    Sexually Abused: No    FAMILY HISTORY: Family History  Problem Relation Age of Onset   Hypertension Father    Lung cancer Father    Hypertension Sister    Uterine cancer Maternal Grandmother    Cervical cancer Paternal Grandmother    Lung cancer Son     ALLERGIES:  is allergic to codeine.  MEDICATIONS:  Current Outpatient Medications  Medication Sig Dispense Refill   acetaminophen (TYLENOL) 500 MG tablet Take 500 mg by mouth every 6 (six) hours as needed for mild pain.     amiodarone (PACERONE) 200 MG tablet Take 1 tablet (200 mg total) by  mouth daily. 30 tablet 0   busPIRone (BUSPAR) 5 MG tablet Take 1 tablet (5 mg total) by mouth daily. 30 tablet 0   ciclopirox (PENLAC) 8 % solution Apply topically at bedtime. Apply over nail and surrounding skin. Apply daily over previous coat. After seven (7) days, may remove with alcohol and continue cycle. 6.6 mL 0   citalopram (CELEXA) 40 MG tablet Take 1 tablet by mouth once daily 90 tablet 0   Cyanocobalamin (B-12) 1000 MCG SUBL Place 1,000 mcg under the tongue once a week. B-12 Drops     ferrous sulfate 324 MG TBEC Take 324 mg by mouth once a week.     losartan (COZAAR) 50 MG tablet Take 1 tablet (50 mg total) by mouth 2 (two) times daily. 60 tablet 11   metoprolol succinate (TOPROL-XL) 25  MG 24 hr tablet Take 1 tablet by mouth once daily 90 tablet 0   rosuvastatin (CRESTOR) 40 MG tablet Take 1 tablet (40 mg total) by mouth every evening. 30 tablet 2   Vitamin D, Ergocalciferol, (DRISDOL) 1.25 MG (50000 UNIT) CAPS capsule Take 1 capsule (50,000 Units total) by mouth every 7 (seven) days. 12 capsule 3   warfarin (COUMADIN) 3 MG tablet Take 3 mg by mouth daily.     No current facility-administered medications for this visit.     Marland Kitchen  PHYSICAL EXAMINATION:   Vitals:   02/05/23 1421  BP: 119/64  Pulse: (!) 55  Resp: 16  Temp: (!) 97.1 F (36.2 C)    Filed Weights   02/05/23 1421  Weight: 138 lb 9.6 oz (62.9 kg)     Physical Exam Vitals and nursing note reviewed.  HENT:     Head: Normocephalic and atraumatic.     Mouth/Throat:     Pharynx: Oropharynx is clear.  Eyes:     Extraocular Movements: Extraocular movements intact.     Pupils: Pupils are equal, round, and reactive to light.  Cardiovascular:     Rate and Rhythm: Normal rate and regular rhythm.  Pulmonary:     Comments: Decreased breath sounds bilaterally.  Abdominal:     Palpations: Abdomen is soft.  Musculoskeletal:        General: Normal range of motion.     Cervical back: Normal range of motion.   Skin:    General: Skin is warm.  Neurological:     General: No focal deficit present.     Mental Status: She is alert and oriented to person, place, and time.  Psychiatric:        Behavior: Behavior normal.        Judgment: Judgment normal.      LABORATORY DATA:  I have reviewed the data as listed Lab Results  Component Value Date   WBC 8.3 02/05/2023   HGB 13.8 02/05/2023   HCT 41.5 02/05/2023   MCV 87.9 02/05/2023   PLT 202 02/05/2023   Recent Labs    04/05/22 1326 07/04/22 1227 10/05/22 1124 10/06/22 1405 02/05/23 1400  NA 138   < > 140 136 136  K 4.4   < > 4.5 4.0 3.7  CL 105   < > 103 105 103  CO2 25   < > 22 25 24   GLUCOSE 98   < > 93 82 135*  BUN 19   < > 21 25* 21  CREATININE 1.05*   < > 1.07* 0.89 1.01*  CALCIUM 8.6*   < > 9.6 8.8* 8.9  GFRNONAA 58*  --   --  >60 >60  PROT 7.1   < > 7.2 6.8 7.5  ALBUMIN 3.9   < > 4.6 3.9 4.1  AST 43*   < > 30 21 27   ALT 43   < > 23 21 31   ALKPHOS 92   < > 112 78 81  BILITOT 0.5   < > 0.3 0.4 0.5   < > = values in this interval not displayed.     No results found.  ASSESSMENT & PLAN:   Symptomatic anemia # JUNE 2023- [PCP; Hb; 8--platelets/WBC- WNL; Iron sat: 4%; ferritin: 13 [LL of N- 15] secondary to to iron deficiency. s/p venofer. HOLD IV venofer.  Currently on iron sulfate causing constipation.  Recommend gentle iron every 3 days.   #Etiology of iron deficiency: pt declines GI evaluation-EGD  colonoscopy/ capsule study.    # History of lung nodule/opacity active smoker -CT scan January 2024 -left upper lobe lung resolved.  Previous right upper lobe lung nodule-improved question scar.  As per radiology,additional chest CT in  12 months to confirm stability- Discussed re: repeating CT scan- pt will talk to Dr.Khan re: follow up CT scan.   # A.fib/ hx of stroke on  Coumadin [Dr.Khan; cardiology]  # CKD- stage III- stable.    # DISPOSITION: # HOLD venofer today.  # follow up in  6 months-MD; labs cbc/;cmp;  iron studies ferritin possible venofer;-Dr.B  Cc; Dr. Welton Flakes  All questions were answered. The patient knows to call the clinic with any problems, questions or concerns.  Earna Coder, MD 02/05/2023 2:30 PM

## 2023-02-05 NOTE — Assessment & Plan Note (Addendum)
#   JUNE 2023- [PCP; Hb; 8--platelets/WBC- WNL; Iron sat: 4%; ferritin: 13 [LL of N- 15] secondary to to iron deficiency. s/p venofer. HOLD IV venofer.  Currently on iron sulfate causing constipation.  Recommend gentle iron every 3 days.   #Etiology of iron deficiency: pt declines GI evaluation-EGD colonoscopy/ capsule study.    # History of lung nodule/opacity active smoker -CT scan January 2024 -left upper lobe lung resolved.  Previous right upper lobe lung nodule-improved question scar.  As per radiology,additional chest CT in  12 months to confirm stability- Discussed re: repeating CT scan- pt will talk to Dr.Khan re: follow up CT scan.   # A.fib/ hx of stroke on  Coumadin [Dr.Khan; cardiology]  # CKD- stage III- stable.    # DISPOSITION: # HOLD venofer today.  # follow up in  6 months-MD; labs cbc/;cmp; iron studies ferritin possible venofer;-Dr.B  Cc; Dr. Welton Flakes

## 2023-02-07 ENCOUNTER — Other Ambulatory Visit: Payer: Self-pay | Admitting: Cardiovascular Disease

## 2023-02-16 ENCOUNTER — Other Ambulatory Visit: Payer: Self-pay | Admitting: Family

## 2023-02-18 ENCOUNTER — Other Ambulatory Visit: Payer: Self-pay | Admitting: Family

## 2023-02-28 ENCOUNTER — Other Ambulatory Visit: Payer: Self-pay | Admitting: Cardiovascular Disease

## 2023-03-23 ENCOUNTER — Other Ambulatory Visit: Payer: Self-pay | Admitting: Family

## 2023-04-04 ENCOUNTER — Ambulatory Visit (INDEPENDENT_AMBULATORY_CARE_PROVIDER_SITE_OTHER): Payer: Medicare PPO | Admitting: Family

## 2023-04-04 ENCOUNTER — Encounter: Payer: Self-pay | Admitting: Family

## 2023-04-04 VITALS — BP 154/74 | HR 48 | Ht 66.0 in | Wt 137.6 lb

## 2023-04-04 DIAGNOSIS — R5383 Other fatigue: Secondary | ICD-10-CM

## 2023-04-04 DIAGNOSIS — I10 Essential (primary) hypertension: Secondary | ICD-10-CM

## 2023-04-04 DIAGNOSIS — E039 Hypothyroidism, unspecified: Secondary | ICD-10-CM | POA: Diagnosis not present

## 2023-04-04 DIAGNOSIS — R7303 Prediabetes: Secondary | ICD-10-CM | POA: Diagnosis not present

## 2023-04-04 DIAGNOSIS — E538 Deficiency of other specified B group vitamins: Secondary | ICD-10-CM | POA: Diagnosis not present

## 2023-04-04 DIAGNOSIS — N760 Acute vaginitis: Secondary | ICD-10-CM

## 2023-04-04 DIAGNOSIS — I480 Paroxysmal atrial fibrillation: Secondary | ICD-10-CM | POA: Diagnosis not present

## 2023-04-04 DIAGNOSIS — E782 Mixed hyperlipidemia: Secondary | ICD-10-CM

## 2023-04-04 DIAGNOSIS — E559 Vitamin D deficiency, unspecified: Secondary | ICD-10-CM

## 2023-04-04 DIAGNOSIS — Z202 Contact with and (suspected) exposure to infections with a predominantly sexual mode of transmission: Secondary | ICD-10-CM

## 2023-04-04 MED ORDER — LOSARTAN POTASSIUM 50 MG PO TABS: 50.0000 mg | ORAL_TABLET | Freq: Every day | ORAL | 3 refills | Status: DC

## 2023-04-04 NOTE — Assessment & Plan Note (Signed)
 Blood pressure well controlled with current medications.  Continue current therapy.  Will reassess at follow up.

## 2023-04-04 NOTE — Assessment & Plan Note (Signed)
 Checking labs today.  Continue current therapy for lipid control. Will modify as needed based on labwork results.

## 2023-04-04 NOTE — Progress Notes (Signed)
 Established Patient Office Visit  Subjective:  Patient ID: Kristin Haas, female    DOB: Feb 14, 1955  Age: 68 y.o. MRN: [id number]  Chief Complaint  Patient presents with   Follow-up    3 month follow up    Patient is here today for her 3 months follow up.  She has been feeling well since last appointment.   She does have additional concerns to discuss today.  Labs are due today. She needs refills.   I have reviewed her active problem list, medication list, allergies, health maintenance, notes from last encounter, lab results for her appointment today.    No other concerns at this time.   Past Medical History:  Diagnosis Date   Anxiety    Atrial fibrillation (HCC)    Complication of anesthesia    difficulty waking up   COPD (chronic obstructive pulmonary disease) (HCC)    Depression    Dysrhythmia    A-fib   Flushing 09/06/2015   Headache    Hypercholesteremia    Hypertension    Palpitation 09/06/2015   Rectus sheath hematoma, initial encounter 02/04/2020    Past Surgical History:  Procedure Laterality Date   HERNIA REPAIR     ORIF HUMERUS FRACTURE Right 04/20/2022   Procedure: OPEN REDUCTION INTERNAL  FIXATION (ORIF) RIGHT PROXIMAL HUMERUS FRACTURE & BONE MARROW ASPIRATE FROM ILIAC CREST;  Surgeon: Cammy Copa, MD;  Location: [facility] OR;  Service: Orthopedics;  Laterality: Right;   TONSILLECTOMY     TUBAL LIGATION      Social History   Socioeconomic History   Marital status: Single    Spouse name: Not on file   Number of children: Not on file   Years of education: Not on file   Highest education level: Not on file  Occupational History   Not on file  Tobacco Use   Smoking status: Every Day    Current packs/day: 0.50    Average packs/day: 0.5 packs/day for 50.0 years (25.0 ttl pk-yrs)    Types: Cigarettes   Smokeless tobacco: Never   Tobacco comments:    Patient down to 2 packs/week, which is approximately 1/2 of her previous intake.   Vaping Use    Vaping status: Never Used  Substance and Sexual Activity   Alcohol use: Yes    Comment: occasional/maybe once a year   Drug use: No   Sexual activity: Never  Other Topics Concern   Not on file  Social History Narrative   Patient used to be a Production designer, theatre/television/film in a warehouse.  Currently retired.  Active smoker.  No significant alcohol.   Social Drivers of Corporate investment banker Strain: Low Risk  (01/04/2023)   Overall Financial Resource Strain (CARDIA)    Difficulty of Paying Living Expenses: Not very hard  Food Insecurity: No Food Insecurity (01/04/2023)   Hunger Vital Sign    Worried About Running Out of Food in the Last Year: Never true    Ran Out of Food in the Last Year: Never true  Transportation Needs: No Transportation Needs (01/04/2023)   PRAPARE - Administrator, Civil Service (Medical): No    Lack of Transportation (Non-Medical): No  Physical Activity: Inactive (01/04/2023)   Exercise Vital Sign    Days of Exercise per Week: 0 days    Minutes of Exercise per Session: 0 min  Stress: No Stress Concern Present (01/04/2023)   [employer] of Occupational Health - Occupational Stress Questionnaire    Feeling  of Stress : Only a little  Social Connections: Moderately Isolated (01/04/2023)   Social Connection and Isolation Panel [NHANES]    Frequency of Communication with Friends and Family: More than three times a week    Frequency of Social Gatherings with Friends and Family: More than three times a week    Attends Religious Services: More than 4 times per year    Active Member of [employer] or Organizations: No    Attends Banker Meetings: Never    Marital Status: Widowed  Intimate Partner Violence: Not At Risk (01/04/2023)   Humiliation, Afraid, Rape, and Kick questionnaire    Fear of Current or Ex-Partner: No    Emotionally Abused: No    Physically Abused: No    Sexually Abused: No    Family History  Problem Relation Age of Onset    Hypertension Father    Lung cancer Father    Hypertension Sister    Uterine cancer Maternal Grandmother    Cervical cancer Paternal Grandmother    Lung cancer Son     Allergies  Allergen Reactions   Codeine Nausea And Vomiting    ROS     Objective:   BP (!) 154/74   Pulse (!) 48   Ht 5\' 6"  (1.676 m)   Wt 137 lb 9.6 oz (62.4 kg)   SpO2 96%   BMI 22.21 kg/m   Vitals:   04/04/23 1048  BP: (!) 154/74  Pulse: (!) 48  Height: 5\' 6"  (1.676 m)  Weight: 137 lb 9.6 oz (62.4 kg)  SpO2: 96%  BMI (Calculated): 22.22    Physical Exam   No results found for any visits on 04/04/23.  Recent Results (from the past 2160 hours)  Ferritin     Status: None   Collection Time: 02/05/23  2:00 PM  Result Value Ref Range   Ferritin 35 11 - 307 ng/mL    Comment: Performed at [facility], [address] [place], [place] [address]  Iron and TIBC     Status: None   Collection Time: 02/05/23  2:00 PM  Result Value Ref Range   Iron 76 28 - 170 ug/dL   TIBC 725 366 - 440 ug/dL   Saturation Ratios 19 10.4 - 31.8 %   UIBC 331 ug/dL    Comment: Performed at [facility], [address] [place], [place] [address]  Comprehensive metabolic panel     Status: Abnormal   Collection Time: 02/05/23  2:00 PM  Result Value Ref Range   Sodium 136 135 - 145 mmol/L   Potassium 3.7 3.5 - 5.1 mmol/L   Chloride 103 98 - 111 mmol/L   CO2 24 22 - 32 mmol/L   Glucose, Bld 135 (H) 70 - 99 mg/dL    Comment: Glucose reference range applies only to samples taken after fasting for at least 8 hours.   BUN 21 8 - 23 mg/dL   Creatinine, Ser 5.95 (H) 0.44 - 1.00 mg/dL   Calcium 8.9 8.9 - 63.8 mg/dL   Total Protein 7.5 6.5 - 8.1 g/dL   Albumin 4.1 3.5 - 5.0 g/dL   AST 27 15 - 41 U/L   ALT 31 0 - 44 U/L   Alkaline Phosphatase 81 38 - 126 U/L   Total Bilirubin 0.5 0.0 - 1.2 mg/dL   GFR, Estimated >75 >64 mL/min    Comment: (NOTE) Calculated using the CKD-EPI Creatinine Equation  (2021)    Anion gap 9 5 -  15    Comment: Performed at [facility], [address] [place], [place] [address]  CBC with Differential/Platelet     Status: None   Collection Time: 02/05/23  2:00 PM  Result Value Ref Range   WBC 8.3 4.0 - 10.5 K/uL   RBC 4.72 3.87 - 5.11 MIL/uL   Hemoglobin 13.8 12.0 - 15.0 g/dL   HCT 84.1 32.4 - 40.1 %   MCV 87.9 80.0 - 100.0 fL   MCH 29.2 26.0 - 34.0 pg   MCHC 33.3 30.0 - 36.0 g/dL   RDW 02.7 25.3 - 66.4 %   Platelets 202 150 - 400 K/uL   nRBC 0.0 0.0 - 0.2 %   Neutrophils Relative % 69 %   Neutro Abs 5.7 1.7 - 7.7 K/uL   Lymphocytes Relative 23 %   Lymphs Abs 2.0 0.7 - 4.0 K/uL   Monocytes Relative 5 %   Monocytes Absolute 0.4 0.1 - 1.0 K/uL   Eosinophils Relative 1 %   Eosinophils Absolute 0.1 0.0 - 0.5 K/uL   Basophils Relative 1 %   Basophils Absolute 0.1 0.0 - 0.1 K/uL   Immature Granulocytes 1 %   Abs Immature Granulocytes 0.05 0.00 - 0.07 K/uL    Comment: Performed at [facility], [address], [place], [place] [address]       Assessment & Plan:   Problem List Items Addressed This Visit   None   No follow-ups on file.   Total time spent: 30 minutes  Miki Kins, FNP  04/04/2023   This document may have been prepared by [facility] Voice Recognition software and as such may include unintentional dictation errors.

## 2023-04-04 NOTE — Assessment & Plan Note (Signed)
 Patient is seen by Cardiology, who manage this condition.  She is well controlled with current therapy.   Will defer to them for further changes to plan of care.

## 2023-04-07 LAB — NUSWAB VG+, HSV
Atopobium vaginae: HIGH {score} — AB
Candida albicans, NAA: POSITIVE — AB
Candida glabrata, NAA: NEGATIVE
Chlamydia trachomatis, NAA: NEGATIVE
HSV 1 NAA: NEGATIVE
HSV 2 NAA: NEGATIVE
Neisseria gonorrhoeae, NAA: NEGATIVE
Trich vag by NAA: NEGATIVE

## 2023-04-09 LAB — TSH: TSH: 2.47 u[IU]/mL (ref 0.450–4.500)

## 2023-04-09 LAB — TREPONEMAL ANTIBODIES, TPPA: Treponemal Antibodies, TPPA: NONREACTIVE

## 2023-04-09 LAB — ACUTE VIRAL HEPATITIS (HAV, HBV, HCV)
HCV Ab: NONREACTIVE
Hep A IgM: NEGATIVE
Hep B C IgM: NEGATIVE
Hepatitis B Surface Ag: NEGATIVE

## 2023-04-09 LAB — CBC WITH DIFFERENTIAL/PLATELET
Basophils Absolute: 0.1 10*3/uL (ref 0.0–0.2)
Basos: 1 %
EOS (ABSOLUTE): 0.2 10*3/uL (ref 0.0–0.4)
Eos: 2 %
Hematocrit: 43.7 % (ref 34.0–46.6)
Hemoglobin: 13.4 g/dL (ref 11.1–15.9)
Immature Grans (Abs): 0.1 10*3/uL (ref 0.0–0.1)
Immature Granulocytes: 1 %
Lymphocytes Absolute: 1.8 10*3/uL (ref 0.7–3.1)
Lymphs: 18 %
MCH: 28.9 pg (ref 26.6–33.0)
MCHC: 30.7 g/dL — ABNORMAL LOW (ref 31.5–35.7)
MCV: 94 fL (ref 79–97)
Monocytes Absolute: 0.6 10*3/uL (ref 0.1–0.9)
Monocytes: 6 %
Neutrophils Absolute: 7.4 10*3/uL — ABNORMAL HIGH (ref 1.4–7.0)
Neutrophils: 72 %
Platelets: 216 10*3/uL (ref 150–450)
RBC: 4.63 x10E6/uL (ref 3.77–5.28)
RDW: 14.3 % (ref 11.7–15.4)
WBC: 10 10*3/uL (ref 3.4–10.8)

## 2023-04-09 LAB — CMP14+EGFR
ALT: 35 IU/L — ABNORMAL HIGH (ref 0–32)
AST: 35 IU/L (ref 0–40)
Albumin: 4.7 g/dL (ref 3.9–4.9)
Alkaline Phosphatase: 108 IU/L (ref 44–121)
BUN/Creatinine Ratio: 19 (ref 12–28)
BUN: 19 mg/dL (ref 8–27)
Bilirubin Total: 0.3 mg/dL (ref 0.0–1.2)
CO2: 23 mmol/L (ref 20–29)
Calcium: 9.3 mg/dL (ref 8.7–10.3)
Chloride: 103 mmol/L (ref 96–106)
Creatinine, Ser: 0.98 mg/dL (ref 0.57–1.00)
Globulin, Total: 2.1 g/dL (ref 1.5–4.5)
Glucose: 96 mg/dL (ref 70–99)
Potassium: 4.1 mmol/L (ref 3.5–5.2)
Sodium: 140 mmol/L (ref 134–144)
Total Protein: 6.8 g/dL (ref 6.0–8.5)
eGFR: 63 mL/min/{1.73_m2} (ref >59)

## 2023-04-09 LAB — VITAMIN D 25 HYDROXY (VIT D DEFICIENCY, FRACTURES): Vit D, 25-Hydroxy: 23.1 ng/mL — ABNORMAL LOW (ref 30.0–100.0)

## 2023-04-09 LAB — LIPID PANEL
Chol/HDL Ratio: 3.2 ratio (ref 0.0–4.4)
Cholesterol, Total: 165 mg/dL (ref 100–199)
HDL: 52 mg/dL (ref >39)
LDL Chol Calc (NIH): 83 mg/dL (ref 0–99)
Triglycerides: 180 mg/dL — ABNORMAL HIGH (ref 0–149)
VLDL Cholesterol Cal: 30 mg/dL (ref 5–40)

## 2023-04-09 LAB — HEMOGLOBIN A1C
Est. average glucose Bld gHb Est-mCnc: 114 mg/dL
Hgb A1c MFr Bld: 5.6 % (ref 4.8–5.6)

## 2023-04-09 LAB — HCV INTERPRETATION

## 2023-04-09 LAB — RPR W/REFLEX TO TREPSURE: RPR: NONREACTIVE

## 2023-04-09 LAB — HIV ANTIBODY (ROUTINE TESTING W REFLEX): HIV Screen 4th Generation wRfx: NONREACTIVE

## 2023-04-09 LAB — VITAMIN B12: Vitamin B-12: 333 pg/mL (ref 232–1245)

## 2023-04-09 MED ORDER — FLUCONAZOLE 150 MG PO TABS
150.0000 mg | ORAL_TABLET | Freq: Every day | ORAL | 0 refills | Status: DC
Start: 2023-04-09 — End: 2023-07-30

## 2023-04-09 NOTE — Addendum Note (Signed)
 Addended by: Grayling Congress on: 04/09/2023 09:52 AM   Modules accepted: Orders

## 2023-04-10 NOTE — Progress Notes (Signed)
 Patient notified

## 2023-04-25 ENCOUNTER — Other Ambulatory Visit: Payer: Self-pay | Admitting: Family

## 2023-05-09 ENCOUNTER — Other Ambulatory Visit: Payer: Self-pay | Admitting: Cardiovascular Disease

## 2023-05-11 ENCOUNTER — Encounter: Payer: Self-pay | Admitting: Cardiovascular Disease

## 2023-05-11 ENCOUNTER — Ambulatory Visit: Admitting: Cardiovascular Disease

## 2023-05-11 ENCOUNTER — Other Ambulatory Visit: Payer: Self-pay

## 2023-05-11 VITALS — BP 130/62 | HR 53 | Ht 66.0 in | Wt 141.1 lb

## 2023-05-11 DIAGNOSIS — I10 Essential (primary) hypertension: Secondary | ICD-10-CM | POA: Diagnosis not present

## 2023-05-11 DIAGNOSIS — E782 Mixed hyperlipidemia: Secondary | ICD-10-CM

## 2023-05-11 DIAGNOSIS — I480 Paroxysmal atrial fibrillation: Secondary | ICD-10-CM

## 2023-05-11 DIAGNOSIS — R791 Abnormal coagulation profile: Secondary | ICD-10-CM

## 2023-05-11 NOTE — Addendum Note (Signed)
 Addended by: Debborah Fairly A on: 05/11/2023 11:00 AM   Modules accepted: Orders

## 2023-05-11 NOTE — Progress Notes (Signed)
 Cardiology Office Note   Date:  05/11/2023   ID:  Kristin Haas, Kristin Haas January 29, 1955, MRN [id number]  PCP:  Trenda Frisk, FNP  Cardiologist:  Debborah Fairly, MD      History of Present Illness: Kristin Haas is a 68 y.o. female who presents for  Chief Complaint  Patient presents with   Follow-up    Follow up    Feeling good      Past Medical History:  Diagnosis Date   Anxiety    Atrial fibrillation (HCC)    Closed 3-part fracture of proximal humerus, right, initial encounter 04/17/2022   Complication of anesthesia    difficulty waking up   COPD (chronic obstructive pulmonary disease) (HCC)    Depression    Dysrhythmia    A-fib   Flushing 09/06/2015   Headache    Hypercholesteremia    Hypertension    Palpitation 09/06/2015   Rectus sheath hematoma, initial encounter 02/04/2020     Past Surgical History:  Procedure Laterality Date   HERNIA REPAIR     ORIF HUMERUS FRACTURE Right 04/20/2022   Procedure: OPEN REDUCTION INTERNAL  FIXATION (ORIF) RIGHT PROXIMAL HUMERUS FRACTURE & BONE MARROW ASPIRATE FROM ILIAC CREST;  Surgeon: Jasmine Mesi, MD;  Location: [facility] OR;  Service: Orthopedics;  Laterality: Right;   TONSILLECTOMY     TUBAL LIGATION       Current Outpatient Medications  Medication Sig Dispense Refill   acetaminophen  (TYLENOL ) 500 MG tablet Take 500 mg by mouth every 6 (six) hours as needed for mild pain.     amiodarone  (PACERONE ) 200 MG tablet Take 1 tablet by mouth once daily 30 tablet 0   busPIRone  (BUSPAR ) 5 MG tablet Take 1 tablet by mouth once daily 30 tablet 0   ciclopirox  (PENLAC ) 8 % solution Apply topically at bedtime. Apply over nail and surrounding skin. Apply daily over previous coat. After seven (7) days, may remove with alcohol and continue cycle. 6.6 mL 0   citalopram  (CELEXA ) 40 MG tablet Take 1 tablet by mouth once daily 90 tablet 0   ferrous sulfate 324 MG TBEC Take 324 mg by mouth once a week.     losartan  (COZAAR ) 50 MG tablet  Take 1 tablet (50 mg total) by mouth daily. 90 tablet 3   metoprolol  succinate (TOPROL -XL) 25 MG 24 hr tablet Take 1 tablet by mouth once daily 90 tablet 0   rosuvastatin  (CRESTOR ) 40 MG tablet Take 1 tablet (40 mg total) by mouth every evening. 30 tablet 2   Vitamin D , Ergocalciferol , (DRISDOL ) 1.25 MG (50000 UNIT) CAPS capsule Take 1 capsule (50,000 Units total) by mouth every 7 (seven) days. 12 capsule 3   warfarin (COUMADIN ) 2 MG tablet Take 1 tablet by mouth once daily 90 tablet 0   Cyanocobalamin  (B-12) 1000 MCG SUBL Place 1,000 mcg under the tongue once a week. B-12 Drops (Patient not taking: Reported on 05/11/2023)     fluconazole  (DIFLUCAN ) 150 MG tablet Take 1 tablet (150 mg total) by mouth daily. (Patient not taking: Reported on 05/11/2023) 5 tablet 0   No current facility-administered medications for this visit.    Allergies:   Codeine    Social History:   reports that she has been smoking cigarettes. She has a 25 pack-year smoking history. She has never used smokeless tobacco. She reports current alcohol use. She reports that she does not use drugs.   Family History:  family history includes Cervical cancer in her paternal  grandmother; Hypertension in her father and sister; Lung cancer in her father and son; Uterine cancer in her maternal grandmother.    ROS:     Review of Systems  Constitutional: Negative.   HENT: Negative.    Eyes: Negative.   Respiratory: Negative.    Gastrointestinal: Negative.   Genitourinary: Negative.   Musculoskeletal: Negative.   Skin: Negative.   Neurological: Negative.   Endo/Heme/Allergies: Negative.   Psychiatric/Behavioral: Negative.    All other systems reviewed and are negative.     All other systems are reviewed and negative.    PHYSICAL EXAM: VS:  BP 130/62   Pulse (!) 53   Ht 5\' 6"  (1.676 m)   Wt 141 lb 1.6 oz (64 kg)   SpO2 92%   BMI 22.77 kg/m  , BMI Body mass index is 22.77 kg/m. Last weight:  Wt Readings from Last 3  Encounters:  05/11/23 141 lb 1.6 oz (64 kg)  04/04/23 137 lb 9.6 oz (62.4 kg)  02/05/23 138 lb 9.6 oz (62.9 kg)     Physical Exam Constitutional:      Appearance: Normal appearance.  Cardiovascular:     Rate and Rhythm: Normal rate and regular rhythm.     Heart sounds: Normal heart sounds.  Pulmonary:     Effort: Pulmonary effort is normal.     Breath sounds: Normal breath sounds.  Musculoskeletal:     Right lower leg: No edema.     Left lower leg: No edema.  Neurological:     Mental Status: She is alert.       EKG:   Recent Labs: 04/04/2023: ALT 35; BUN 19; Creatinine, Ser 0.98; Hemoglobin 13.4; Platelets 216; Potassium 4.1; Sodium 140; TSH 2.470    Lipid Panel    Component Value Date/Time   CHOL 165 04/04/2023 1131   TRIG 180 (H) 04/04/2023 1131   HDL 52 04/04/2023 1131   CHOLHDL 3.2 04/04/2023 1131   CHOLHDL 4.6 09/06/2015 1324   VLDL 29 09/06/2015 1324   LDLCALC 83 04/04/2023 1131      Other studies Reviewed: Additional studies/ records that were reviewed today include:  Review of the above records demonstrates:       No data to display            ASSESSMENT AND PLAN:    ICD-10-CM   1. Paroxysmal atrial fibrillation (HCC)  I48.0    Feeling good, in NSR    2. Mixed hyperlipidemia  E78.2     3. Primary hypertension  I10        Problem List Items Addressed This Visit       Cardiovascular and Mediastinum   A-fib (HCC) - Primary   HTN (hypertension)     Other   HLD (hyperlipidemia)       Disposition:   No follow-ups on file.    Total time spent: 35 minutes  Signed,  Debborah Fairly, MD  05/11/2023 10:17 AM    [facility]

## 2023-05-12 LAB — PROTIME-INR
INR: 2.8 — ABNORMAL HIGH (ref 0.9–1.2)
Prothrombin Time: 28.6 s — ABNORMAL HIGH (ref 9.1–12.0)

## 2023-05-12 LAB — APTT: aPTT: 43 s — ABNORMAL HIGH (ref 24–33)

## 2023-05-22 ENCOUNTER — Other Ambulatory Visit: Payer: Self-pay | Admitting: Family

## 2023-05-24 ENCOUNTER — Other Ambulatory Visit: Payer: Self-pay | Admitting: Family

## 2023-05-25 ENCOUNTER — Other Ambulatory Visit: Payer: Self-pay | Admitting: Cardiovascular Disease

## 2023-05-29 ENCOUNTER — Other Ambulatory Visit: Payer: Self-pay | Admitting: Cardiovascular Disease

## 2023-06-26 ENCOUNTER — Other Ambulatory Visit: Payer: Self-pay

## 2023-06-26 DIAGNOSIS — E782 Mixed hyperlipidemia: Secondary | ICD-10-CM

## 2023-06-26 DIAGNOSIS — I10 Essential (primary) hypertension: Secondary | ICD-10-CM

## 2023-06-26 DIAGNOSIS — I480 Paroxysmal atrial fibrillation: Secondary | ICD-10-CM

## 2023-06-27 ENCOUNTER — Other Ambulatory Visit: Payer: Self-pay | Admitting: Family

## 2023-06-27 MED ORDER — ROSUVASTATIN CALCIUM 40 MG PO TABS: 40.0000 mg | ORAL_TABLET | Freq: Every evening | ORAL | 1 refills | Status: DC

## 2023-07-05 ENCOUNTER — Ambulatory Visit (INDEPENDENT_AMBULATORY_CARE_PROVIDER_SITE_OTHER): Admitting: Family

## 2023-07-05 ENCOUNTER — Encounter: Payer: Self-pay | Admitting: Family

## 2023-07-05 DIAGNOSIS — E782 Mixed hyperlipidemia: Secondary | ICD-10-CM | POA: Diagnosis not present

## 2023-07-05 DIAGNOSIS — R5383 Other fatigue: Secondary | ICD-10-CM | POA: Diagnosis not present

## 2023-07-05 DIAGNOSIS — I10 Essential (primary) hypertension: Secondary | ICD-10-CM

## 2023-07-05 DIAGNOSIS — E559 Vitamin D deficiency, unspecified: Secondary | ICD-10-CM | POA: Diagnosis not present

## 2023-07-05 DIAGNOSIS — Z202 Contact with and (suspected) exposure to infections with a predominantly sexual mode of transmission: Secondary | ICD-10-CM | POA: Diagnosis not present

## 2023-07-05 DIAGNOSIS — I480 Paroxysmal atrial fibrillation: Secondary | ICD-10-CM

## 2023-07-05 DIAGNOSIS — E039 Hypothyroidism, unspecified: Secondary | ICD-10-CM | POA: Diagnosis not present

## 2023-07-05 DIAGNOSIS — R7303 Prediabetes: Secondary | ICD-10-CM

## 2023-07-05 DIAGNOSIS — E538 Deficiency of other specified B group vitamins: Secondary | ICD-10-CM

## 2023-07-05 NOTE — Progress Notes (Signed)
 Established Patient Office Visit  Subjective:  Patient ID: Kristin Haas, female    DOB: 02-Nov-1955  Age: 68 y.o. MRN: [id number]  Chief Complaint  Patient presents with   Follow-up    3 month follow up    Patient is here today for her 3 months follow up.  She has been feeling fairly well since last appointment.   She does not have additional concerns to discuss today.  Labs are due today. She needs refills.   I have reviewed her active problem list, medication list, allergies, notes from last encounter, lab results for her appointment today.    No other concerns at this time.   Past Medical History:  Diagnosis Date   Anxiety    Atrial fibrillation (HCC)    Closed 3-part fracture of proximal humerus, right, initial encounter 04/17/2022   Complication of anesthesia    difficulty waking up   COPD (chronic obstructive pulmonary disease) (HCC)    Depression    Dysrhythmia    A-fib   Flushing 09/06/2015   Headache    Hypercholesteremia    Hypertension    Palpitation 09/06/2015   Rectus sheath hematoma, initial encounter 02/04/2020    Past Surgical History:  Procedure Laterality Date   HERNIA REPAIR     ORIF HUMERUS FRACTURE Right 04/20/2022   Procedure: OPEN REDUCTION INTERNAL  FIXATION (ORIF) RIGHT PROXIMAL HUMERUS FRACTURE & BONE MARROW ASPIRATE FROM ILIAC CREST;  Surgeon: Jasmine Mesi, MD;  Location: [facility] OR;  Service: Orthopedics;  Laterality: Right;   TONSILLECTOMY     TUBAL LIGATION      Social History   Socioeconomic History   Marital status: Single    Spouse name: Not on file   Number of children: Not on file   Years of education: Not on file   Highest education level: Not on file  Occupational History   Not on file  Tobacco Use   Smoking status: Every Day    Current packs/day: 0.50    Average packs/day: 0.5 packs/day for 50.0 years (25.0 ttl pk-yrs)    Types: Cigarettes   Smokeless tobacco: Never   Tobacco comments:    Patient down to 2  packs/week, which is approximately 1/2 of her previous intake.   Vaping Use   Vaping status: Never Used  Substance and Sexual Activity   Alcohol use: Yes    Comment: occasional/maybe once a year   Drug use: No   Sexual activity: Never  Other Topics Concern   Not on file  Social History Narrative   Patient used to be a Production designer, theatre/television/film in a warehouse.  Currently retired.  Active smoker.  No significant alcohol.   Social Drivers of Corporate investment banker Strain: Low Risk  (01/04/2023)   Overall Financial Resource Strain (CARDIA)    Difficulty of Paying Living Expenses: Not very hard  Food Insecurity: No Food Insecurity (01/04/2023)   Hunger Vital Sign    Worried About Running Out of Food in the Last Year: Never true    Ran Out of Food in the Last Year: Never true  Transportation Needs: No Transportation Needs (01/04/2023)   PRAPARE - Administrator, Civil Service (Medical): No    Lack of Transportation (Non-Medical): No  Physical Activity: Inactive (01/04/2023)   Exercise Vital Sign    Days of Exercise per Week: 0 days    Minutes of Exercise per Session: 0 min  Stress: No Stress Concern Present (01/04/2023)   [place]  [employer] - Occupational Stress Questionnaire    Feeling of Stress : Only a little  Social Connections: Moderately Isolated (01/04/2023)   Social Connection and Isolation Panel    Frequency of Communication with Friends and Family: More than three times a week    Frequency of Social Gatherings with Friends and Family: More than three times a week    Attends Religious Services: More than 4 times per year    Active Member of [employer] or Organizations: No    Attends Banker Meetings: Never    Marital Status: Widowed  Intimate Partner Violence: Not At Risk (01/04/2023)   Humiliation, Afraid, Rape, and Kick questionnaire    Fear of Current or Ex-Partner: No    Emotionally Abused: No    Physically Abused: No    Sexually  Abused: No    Family History  Problem Relation Age of Onset   Hypertension Father    Lung cancer Father    Hypertension Sister    Uterine cancer Maternal Grandmother    Cervical cancer Paternal Grandmother    Lung cancer Son     Allergies  Allergen Reactions   Codeine Nausea And Vomiting    Review of Systems  All other systems reviewed and are negative.      Objective:   BP 110/70   Pulse (!) 52   Ht 5' 6 (1.676 m)   Wt 139 lb (63 kg)   SpO2 96%   BMI 22.44 kg/m   Vitals:   07/05/23 1049  BP: 110/70  Pulse: (!) 52  Height: 5' 6 (1.676 m)  Weight: 139 lb (63 kg)  SpO2: 96%  BMI (Calculated): 22.45    Physical Exam Vitals and nursing note reviewed.  Constitutional:      Appearance: Normal appearance. She is normal weight.  HENT:     Head: Normocephalic.   Eyes:     Extraocular Movements: Extraocular movements intact.     Conjunctiva/sclera: Conjunctivae normal.     Pupils: Pupils are equal, round, and reactive to light.    Cardiovascular:     Rate and Rhythm: Normal rate.  Pulmonary:     Effort: Pulmonary effort is normal.   Neurological:     General: No focal deficit present.     Mental Status: She is alert and oriented to person, place, and time. Mental status is at baseline.   Psychiatric:        Mood and Affect: Mood normal.        Behavior: Behavior normal.        Thought Content: Thought content normal.        Judgment: Judgment normal.      No results found for any visits on 07/05/23.  Recent Results (from the past 2160 hours)  APTT     Status: Abnormal   Collection Time: 05/11/23 11:13 AM  Result Value Ref Range   aPTT 43 (H) 24 - 33 sec    Comment: This test has not been validated for monitoring unfractionated heparin  therapy. aPTT-based therapeutic ranges for unfractionated heparin  therapy have not been established. For general guidelines on Heparin  monitoring, refer to the [employer].   Protime-INR      Status: Abnormal   Collection Time: 05/11/23 11:13 AM  Result Value Ref Range   INR 2.8 (H) 0.9 - 1.2    Comment: Reference interval is for non-anticoagulated patients. Suggested INR therapeutic range for Vitamin K antagonist therapy:    Standard  Dose (moderate intensity                   therapeutic range):       2.0 - 3.0    Higher intensity therapeutic range       2.5 - 3.5    Prothrombin  Time 28.6 (H) 9.1 - 12.0 sec       Assessment & Plan:   Assessment & Plan Paroxysmal atrial fibrillation [facility]) Patient is seen by Cardiology, who manage this condition.  She is well controlled with current therapy.   Will defer to them for further changes to plan of care. Primary hypertension Blood pressure well controlled with current medications.  Continue current therapy.  Will reassess at follow up.  Mixed hyperlipidemia Checking labs today.  Continue current therapy for lipid control. Will modify as needed based on labwork results.  Prediabetes A1C Continues to be in prediabetic ranges.  Will reassess at follow up after next lab check.  Patient counseled on dietary choices and verbalized understanding.  Patient educated on foods that contain carbohydrates and the need to decrease intake.  We discussed prediabetes, and what it means and the need for strict dietary control to prevent progression to type 2 diabetes.  Advised to decrease intake of sugary drinks, including sodas, sweet tea, and some juices, and of starch and sugar heavy foods (ie., potatoes, rice, bread, pasta, desserts). She verbalizes understanding and agreement with the changes discussed today.  Hypothyroidism (acquired) Vitamin D  deficiency, unspecified Other fatigue B12 deficiency due to diet Checking labs today.  Will continue supplements as needed.     Return in about 3 months (around 10/05/2023).   Total time spent: 20 minutes  Trenda Frisk, FNP  07/05/2023   This document may have been prepared  by [facility] Voice Recognition software and as such may include unintentional dictation errors.

## 2023-07-05 NOTE — Assessment & Plan Note (Signed)
 Patient is seen by Cardiology, who manage this condition.  She is well controlled with current therapy.   Will defer to them for further changes to plan of care.

## 2023-07-05 NOTE — Assessment & Plan Note (Signed)
 Blood pressure well controlled with current medications.  Continue current therapy.  Will reassess at follow up.

## 2023-07-05 NOTE — Assessment & Plan Note (Signed)
 Checking labs today.  Continue current therapy for lipid control. Will modify as needed based on labwork results.

## 2023-07-06 LAB — CBC WITH DIFFERENTIAL/PLATELET
Basophils Absolute: 0.1 10*3/uL (ref 0.0–0.2)
Basos: 1 %
EOS (ABSOLUTE): 0.1 10*3/uL (ref 0.0–0.4)
Eos: 1 %
Hematocrit: 42.2 % (ref 34.0–46.6)
Hemoglobin: 13.3 g/dL (ref 11.1–15.9)
Immature Grans (Abs): 0.1 10*3/uL (ref 0.0–0.1)
Immature Granulocytes: 1 %
Lymphocytes Absolute: 1.7 10*3/uL (ref 0.7–3.1)
Lymphs: 13 %
MCH: 29.2 pg (ref 26.6–33.0)
MCHC: 31.5 g/dL (ref 31.5–35.7)
MCV: 93 fL (ref 79–97)
Monocytes Absolute: 0.8 10*3/uL (ref 0.1–0.9)
Monocytes: 7 %
Neutrophils Absolute: 9.7 10*3/uL — ABNORMAL HIGH (ref 1.4–7.0)
Neutrophils: 77 %
Platelets: 251 10*3/uL (ref 150–450)
RBC: 4.55 x10E6/uL (ref 3.77–5.28)
RDW: 13.3 % (ref 11.7–15.4)
WBC: 12.5 10*3/uL — ABNORMAL HIGH (ref 3.4–10.8)

## 2023-07-06 LAB — LIPID PANEL
Chol/HDL Ratio: 2.9 ratio (ref 0.0–4.4)
Cholesterol, Total: 135 mg/dL (ref 100–199)
HDL: 47 mg/dL (ref >39)
LDL Chol Calc (NIH): 65 mg/dL (ref 0–99)
Triglycerides: 134 mg/dL (ref 0–149)
VLDL Cholesterol Cal: 23 mg/dL (ref 5–40)

## 2023-07-06 LAB — CMP14+EGFR
ALT: 22 IU/L (ref 0–32)
AST: 22 IU/L (ref 0–40)
Albumin: 4.1 g/dL (ref 3.9–4.9)
Alkaline Phosphatase: 120 IU/L (ref 44–121)
BUN/Creatinine Ratio: 19 (ref 12–28)
BUN: 20 mg/dL (ref 8–27)
Bilirubin Total: 0.3 mg/dL (ref 0.0–1.2)
CO2: 18 mmol/L — ABNORMAL LOW (ref 20–29)
Calcium: 9 mg/dL (ref 8.7–10.3)
Chloride: 100 mmol/L (ref 96–106)
Creatinine, Ser: 1.05 mg/dL — ABNORMAL HIGH (ref 0.57–1.00)
Globulin, Total: 2.6 g/dL (ref 1.5–4.5)
Glucose: 41 mg/dL — ABNORMAL LOW (ref 70–99)
Potassium: 4.3 mmol/L (ref 3.5–5.2)
Sodium: 141 mmol/L (ref 134–144)
Total Protein: 6.7 g/dL (ref 6.0–8.5)
eGFR: 58 mL/min/{1.73_m2} — ABNORMAL LOW (ref >59)

## 2023-07-06 LAB — HEMOGLOBIN A1C
Est. average glucose Bld gHb Est-mCnc: 120 mg/dL
Hgb A1c MFr Bld: 5.8 % — ABNORMAL HIGH (ref 4.8–5.6)

## 2023-07-06 LAB — TSH: TSH: 2.07 u[IU]/mL (ref 0.450–4.500)

## 2023-07-06 LAB — VITAMIN D 25 HYDROXY (VIT D DEFICIENCY, FRACTURES): Vit D, 25-Hydroxy: 29.6 ng/mL — ABNORMAL LOW (ref 30.0–100.0)

## 2023-07-06 LAB — VITAMIN B12: Vitamin B-12: 257 pg/mL (ref 232–1245)

## 2023-07-10 ENCOUNTER — Ambulatory Visit: Payer: Self-pay | Admitting: Family

## 2023-07-10 ENCOUNTER — Encounter: Payer: Self-pay | Admitting: Family

## 2023-07-10 ENCOUNTER — Telehealth: Payer: Self-pay | Admitting: Family

## 2023-07-10 DIAGNOSIS — R799 Abnormal finding of blood chemistry, unspecified: Secondary | ICD-10-CM

## 2023-07-10 NOTE — Telephone Encounter (Signed)
 Patient left VM wanting to know if we can put in the order for her to do a UA at [facility] on [address] in [place]? She cannot find a ride to [place].

## 2023-07-10 NOTE — Telephone Encounter (Signed)
 Patient left 2 voicemails requesting her lab results and wanting to discuss the abnormal labs with Alan. Please advise on lab results so we can call patient.

## 2023-07-11 ENCOUNTER — Other Ambulatory Visit: Payer: Self-pay

## 2023-07-11 DIAGNOSIS — R799 Abnormal finding of blood chemistry, unspecified: Secondary | ICD-10-CM

## 2023-07-12 ENCOUNTER — Ambulatory Visit (INDEPENDENT_AMBULATORY_CARE_PROVIDER_SITE_OTHER): Admitting: Family

## 2023-07-12 ENCOUNTER — Other Ambulatory Visit: Payer: Self-pay | Admitting: Family

## 2023-07-12 DIAGNOSIS — R300 Dysuria: Secondary | ICD-10-CM | POA: Diagnosis not present

## 2023-07-12 LAB — POCT URINALYSIS DIPSTICK
Bilirubin, UA: NEGATIVE
Glucose, UA: NEGATIVE
Ketones, UA: NEGATIVE
Nitrite, UA: NEGATIVE
Protein, UA: POSITIVE — AB
Spec Grav, UA: 1.015 (ref 1.010–1.025)
Urobilinogen, UA: 0.2 U/dL
pH, UA: 7.5 (ref 5.0–8.0)

## 2023-07-12 MED ORDER — CIPROFLOXACIN HCL 500 MG PO TABS: 500.0000 mg | ORAL_TABLET | Freq: Two times a day (BID) | ORAL | 0 refills | Status: DC

## 2023-07-12 NOTE — Progress Notes (Signed)
   CHIEF COMPLAINT  UA/ only visit fot UTI     REASON FOR VISIT  Possible UTI, UA Visit Only      ASSESSMENT & PLAN Diagnoses and all orders for this visit:  Dysuria -     POCT Urinalysis Dipstick (81002) -     Urinalysis, Routine w reflex microscopic -     Urine Culture  Other orders -     ciprofloxacin (CIPRO) 500 MG tablet; Take 1 tablet (500 mg total) by mouth 2 (two) times daily for 5 days.     Patient notified.  Total time spent: 5 minutes  ALAN CHRISTELLA ARRANT, FNP 07/12/2023

## 2023-07-13 LAB — URINALYSIS, ROUTINE W REFLEX MICROSCOPIC
Bilirubin, UA: NEGATIVE
Glucose, UA: NEGATIVE
Ketones, UA: NEGATIVE
Nitrite, UA: NEGATIVE
RBC, UA: NEGATIVE
Specific Gravity, UA: 1.007 (ref 1.005–1.030)
Urobilinogen, Ur: 0.2 mg/dL (ref 0.2–1.0)
pH, UA: 8 — ABNORMAL HIGH (ref 5.0–7.5)

## 2023-07-13 LAB — MICROSCOPIC EXAMINATION
Bacteria, UA: NONE SEEN
Casts: NONE SEEN /LPF
WBC, UA: NONE SEEN /HPF (ref 0–5)

## 2023-07-13 NOTE — Telephone Encounter (Signed)
 Kristin Haas has already advised pt of results

## 2023-07-14 ENCOUNTER — Encounter: Payer: Self-pay | Admitting: Family

## 2023-07-14 LAB — URINE CULTURE

## 2023-07-17 ENCOUNTER — Other Ambulatory Visit: Payer: Self-pay

## 2023-07-17 MED ORDER — NITROFURANTOIN MONOHYD MACRO 100 MG PO CAPS: 100.0000 mg | ORAL_CAPSULE | Freq: Two times a day (BID) | ORAL | 0 refills | Status: DC

## 2023-07-28 ENCOUNTER — Other Ambulatory Visit: Payer: Self-pay | Admitting: Family

## 2023-08-03 ENCOUNTER — Encounter: Payer: Self-pay | Admitting: Advanced Practice Midwife

## 2023-08-04 ENCOUNTER — Other Ambulatory Visit: Payer: Self-pay | Admitting: Cardiovascular Disease

## 2023-08-08 ENCOUNTER — Encounter: Payer: Self-pay | Admitting: Internal Medicine

## 2023-08-08 ENCOUNTER — Inpatient Hospital Stay (HOSPITAL_BASED_OUTPATIENT_CLINIC_OR_DEPARTMENT_OTHER): Payer: Medicare PPO | Admitting: Internal Medicine

## 2023-08-08 ENCOUNTER — Inpatient Hospital Stay: Payer: Medicare PPO

## 2023-08-08 ENCOUNTER — Inpatient Hospital Stay: Payer: Medicare PPO | Attending: Internal Medicine

## 2023-08-08 DIAGNOSIS — I4891 Unspecified atrial fibrillation: Secondary | ICD-10-CM | POA: Diagnosis not present

## 2023-08-08 DIAGNOSIS — D509 Iron deficiency anemia, unspecified: Secondary | ICD-10-CM | POA: Insufficient documentation

## 2023-08-08 DIAGNOSIS — Z7901 Long term (current) use of anticoagulants: Secondary | ICD-10-CM | POA: Insufficient documentation

## 2023-08-08 DIAGNOSIS — D649 Anemia, unspecified: Secondary | ICD-10-CM

## 2023-08-08 DIAGNOSIS — Z801 Family history of malignant neoplasm of trachea, bronchus and lung: Secondary | ICD-10-CM | POA: Diagnosis not present

## 2023-08-08 DIAGNOSIS — E538 Deficiency of other specified B group vitamins: Secondary | ICD-10-CM | POA: Insufficient documentation

## 2023-08-08 DIAGNOSIS — F17210 Nicotine dependence, cigarettes, uncomplicated: Secondary | ICD-10-CM | POA: Insufficient documentation

## 2023-08-08 DIAGNOSIS — Z8049 Family history of malignant neoplasm of other genital organs: Secondary | ICD-10-CM | POA: Insufficient documentation

## 2023-08-08 LAB — CMP (CANCER CENTER ONLY)
ALT: 32 U/L (ref 0–44)
AST: 27 U/L (ref 15–41)
Albumin: 3.9 g/dL (ref 3.5–5.0)
Alkaline Phosphatase: 91 U/L (ref 38–126)
Anion gap: 7 (ref 5–15)
BUN: 20 mg/dL (ref 8–23)
CO2: 23 mmol/L (ref 22–32)
Calcium: 8.8 mg/dL — ABNORMAL LOW (ref 8.9–10.3)
Chloride: 105 mmol/L (ref 98–111)
Creatinine: 0.99 mg/dL (ref 0.44–1.00)
GFR, Estimated: >60 mL/min (ref >60)
Glucose, Bld: 104 mg/dL — ABNORMAL HIGH (ref 70–99)
Potassium: 4.1 mmol/L (ref 3.5–5.1)
Sodium: 135 mmol/L (ref 135–145)
Total Bilirubin: 0.5 mg/dL (ref 0.0–1.2)
Total Protein: 7 g/dL (ref 6.5–8.1)

## 2023-08-08 LAB — CBC WITH DIFFERENTIAL (CANCER CENTER ONLY)
Abs Immature Granulocytes: 0.05 K/uL (ref 0.00–0.07)
Basophils Absolute: 0.1 K/uL (ref 0.0–0.1)
Basophils Relative: 1 %
Eosinophils Absolute: 0.1 K/uL (ref 0.0–0.5)
Eosinophils Relative: 2 %
HCT: 39.7 % (ref 36.0–46.0)
Hemoglobin: 13.1 g/dL (ref 12.0–15.0)
Immature Granulocytes: 1 %
Lymphocytes Relative: 18 %
Lymphs Abs: 1.4 K/uL (ref 0.7–4.0)
MCH: 29.7 pg (ref 26.0–34.0)
MCHC: 33 g/dL (ref 30.0–36.0)
MCV: 90 fL (ref 80.0–100.0)
Monocytes Absolute: 0.5 K/uL (ref 0.1–1.0)
Monocytes Relative: 6 %
Neutro Abs: 5.7 K/uL (ref 1.7–7.7)
Neutrophils Relative %: 72 %
Platelet Count: 208 K/uL (ref 150–400)
RBC: 4.41 MIL/uL (ref 3.87–5.11)
RDW: 14.6 % (ref 11.5–15.5)
WBC Count: 7.8 K/uL (ref 4.0–10.5)
nRBC: 0 % (ref 0.0–0.2)

## 2023-08-08 LAB — IRON AND TIBC
Iron: 69 ug/dL (ref 28–170)
Saturation Ratios: 19 % (ref 10.4–31.8)
TIBC: 372 ug/dL (ref 250–450)
UIBC: 303 ug/dL

## 2023-08-08 LAB — FERRITIN: Ferritin: 48 ng/mL (ref 11–307)

## 2023-08-08 NOTE — Progress Notes (Signed)
 [name]  Patient Care Team: Orlean Alan HERO, FNP as PCP - General (Family Medicine) Rennie Cindy SAUNDERS, MD as Consulting Physician (Oncology)  CHIEF COMPLAINTS/PURPOSE OF CONSULTATION: ANEMIA   HEMATOLOGY HISTORY  # JUNE 2023- ANEMIA[Hb; 8- MCV-platelets/WBC- WNL; Iron  sat: 4%; ferritin: 13 [LL of N- 15] creat 1.1; CT/US - ;  EGD/colonoscopy-never  # A.fib on coumadin  [Dr.Khan; Hx of Hematoma- JAN 2022- noted to have anemia at that time.  Hemoglobin around 7.  Admitted to hospital however did not have any blood transfusions on.  Patient was taken off Coumadin  at the time for 3 months.  She is currently on Coumadin  [Dr.Khan]]  # active smoker;   HISTORY OF PRESENTING ILLNESS: Alone.  Ambulating independently.  Kristin Haas 68 y.o.  female pleasant with a history of iron  deficient anemia; history of stroke/A-fib on Coumadin  is here for follow-up.   Today pt denies any symptoms of anemia. Denies dyspnea or dizziness. No blood in stool. Takes coumadin  3 mg daily. Patient on weekly iron  PO.   Denies fatigue. No dizziness or dyspnea.    Review of Systems  Constitutional:  Negative for chills, diaphoresis and fever.  HENT:  Negative for nosebleeds and sore throat.   Eyes:  Negative for double vision.  Respiratory:  Negative for cough, hemoptysis, sputum production, shortness of breath and wheezing.   Cardiovascular:  Negative for chest pain, palpitations, orthopnea and leg swelling.  Gastrointestinal:  Negative for abdominal pain, blood in stool, constipation, diarrhea, heartburn, melena, nausea and vomiting.  Genitourinary:  Negative for dysuria, frequency and urgency.  Musculoskeletal:  Negative for back pain and joint pain.  Skin: Negative.  Negative for itching and rash.  Neurological:  Positive for weakness. Negative for dizziness, tingling, focal weakness and headaches.  Endo/Heme/Allergies:  Does not bruise/bleed easily.   Psychiatric/Behavioral:  Negative for depression. The patient is not nervous/anxious and does not have insomnia.    MEDICAL HISTORY:  Past Medical History:  Diagnosis Date   Anxiety    Atrial fibrillation (HCC)    Closed 3-part fracture of proximal humerus, right, initial encounter 04/17/2022   Complication of anesthesia    difficulty waking up   COPD (chronic obstructive pulmonary disease) (HCC)    Depression    Dysrhythmia    A-fib   Flushing 09/06/2015   Headache    Hypercholesteremia    Hypertension    Palpitation 09/06/2015   Rectus sheath hematoma, initial encounter 02/04/2020    SURGICAL HISTORY: Past Surgical History:  Procedure Laterality Date   HERNIA REPAIR     ORIF HUMERUS FRACTURE Right 04/20/2022   Procedure: OPEN REDUCTION INTERNAL  FIXATION (ORIF) RIGHT PROXIMAL HUMERUS FRACTURE & BONE MARROW ASPIRATE FROM ILIAC CREST;  Surgeon: Addie Cordella Hamilton, MD;  Location: [facility] OR;  Service: Orthopedics;  Laterality: Right;   TONSILLECTOMY     TUBAL LIGATION      SOCIAL HISTORY: Social History   Socioeconomic History   Marital status: Single    Spouse name: Not on file   Number of children: Not on file   Years of education: Not on file   Highest education level: Not on file  Occupational History   Not on file  Tobacco Use   Smoking status: Every Day    Current packs/day: 0.50    Average packs/day: 0.5 packs/day for 50.0 years (25.0 ttl pk-yrs)    Types: Cigarettes   Smokeless tobacco: Never   Tobacco comments:    Patient down to 2  packs/week, which is approximately 1/2 of her previous intake.   Vaping Use   Vaping status: Never Used  Substance and Sexual Activity   Alcohol use: Yes    Comment: occasional/maybe once a year   Drug use: No   Sexual activity: Never  Other Topics Concern   Not on file  Social History Narrative   Patient used to be a Production designer, theatre/television/film in a warehouse.  Currently retired.  Active smoker.  No significant alcohol.   Social Drivers of  Corporate investment banker Strain: Low Risk  (01/04/2023)   Overall Financial Resource Strain (CARDIA)    Difficulty of Paying Living Expenses: Not very hard  Food Insecurity: No Food Insecurity (01/04/2023)   Hunger Vital Sign    Worried About Running Out of Food in the Last Year: Never true    Ran Out of Food in the Last Year: Never true  Transportation Needs: No Transportation Needs (01/04/2023)   PRAPARE - Administrator, Civil Service (Medical): No    Lack of Transportation (Non-Medical): No  Physical Activity: Inactive (01/04/2023)   Exercise Vital Sign    Days of Exercise per Week: 0 days    Minutes of Exercise per Session: 0 min  Stress: No Stress Concern Present (01/04/2023)   [employer] of Occupational Health - Occupational Stress Questionnaire    Feeling of Stress : Only a little  Social Connections: Moderately Isolated (01/04/2023)   Social Connection and Isolation Panel    Frequency of Communication with Friends and Family: More than three times a week    Frequency of Social Gatherings with Friends and Family: More than three times a week    Attends Religious Services: More than 4 times per year    Active Member of [employer] or Organizations: No    Attends Banker Meetings: Never    Marital Status: Widowed  Intimate Partner Violence: Not At Risk (01/04/2023)   Humiliation, Afraid, Rape, and Kick questionnaire    Fear of Current or Ex-Partner: No    Emotionally Abused: No    Physically Abused: No    Sexually Abused: No    FAMILY HISTORY: Family History  Problem Relation Age of Onset   Hypertension Father    Lung cancer Father    Hypertension Sister    Uterine cancer Maternal Grandmother    Cervical cancer Paternal Grandmother    Lung cancer Son     ALLERGIES:  is allergic to codeine.  MEDICATIONS:  Current Outpatient Medications  Medication Sig Dispense Refill   acetaminophen  (TYLENOL ) 500 MG tablet Take 500 mg by mouth every  6 (six) hours as needed for mild pain.     amiodarone  (PACERONE ) 200 MG tablet Take 1 tablet by mouth once daily 30 tablet 0   busPIRone  (BUSPAR ) 5 MG tablet Take 1 tablet by mouth once daily 30 tablet 0   ciclopirox  (PENLAC ) 8 % solution Apply topically at bedtime. Apply over nail and surrounding skin. Apply daily over previous coat. After seven (7) days, may remove with alcohol and continue cycle. 6.6 mL 0   citalopram  (CELEXA ) 40 MG tablet Take 1 tablet by mouth once daily 90 tablet 0   Cyanocobalamin  (B-12) 1000 MCG SUBL Place 1,000 mcg under the tongue once a week. B-12 Drops     ferrous sulfate 324 MG TBEC Take 324 mg by mouth once a week.     losartan  (COZAAR ) 50 MG tablet Take 1 tablet (50 mg total) by mouth daily.  90 tablet 3   Melatonin 10 MG TABS Take 10 mg by mouth at bedtime.     metoprolol  succinate (TOPROL -XL) 25 MG 24 hr tablet Take 1 tablet by mouth once daily 90 tablet 0   rosuvastatin  (CRESTOR ) 40 MG tablet Take 1 tablet (40 mg total) by mouth every evening. 90 tablet 1   Vitamin D , Ergocalciferol , (DRISDOL ) 1.25 MG (50000 UNIT) CAPS capsule Take 1 capsule (50,000 Units total) by mouth every 7 (seven) days. 12 capsule 3   warfarin (COUMADIN ) 2 MG tablet Take 1 tablet by mouth once daily 90 tablet 0   No current facility-administered medications for this visit.     SABRA  PHYSICAL EXAMINATION:   Vitals:   08/08/23 1232  BP: (!) 140/71  Pulse: (!) 48  Resp: 12  Temp: (!) 96.5 F (35.8 C)  SpO2: 98%    Filed Weights   08/08/23 1232  Weight: 140 lb (63.5 kg)     Physical Exam Vitals and nursing note reviewed.  HENT:     Head: Normocephalic and atraumatic.     Mouth/Throat:     Pharynx: Oropharynx is clear.  Eyes:     Extraocular Movements: Extraocular movements intact.     Pupils: Pupils are equal, round, and reactive to light.  Cardiovascular:     Rate and Rhythm: Normal rate and regular rhythm.  Pulmonary:     Comments: Decreased breath sounds  bilaterally.  Abdominal:     Palpations: Abdomen is soft.  Musculoskeletal:        General: Normal range of motion.     Cervical back: Normal range of motion.  Skin:    General: Skin is warm.  Neurological:     General: No focal deficit present.     Mental Status: She is alert and oriented to person, place, and time.  Psychiatric:        Behavior: Behavior normal.        Judgment: Judgment normal.      LABORATORY DATA:  I have reviewed the data as listed Lab Results  Component Value Date   WBC 7.8 08/08/2023   HGB 13.1 08/08/2023   HCT 39.7 08/08/2023   MCV 90.0 08/08/2023   PLT 208 08/08/2023   Recent Labs    10/06/22 1405 02/05/23 1400 04/04/23 1131 07/05/23 1121 08/08/23 1230  NA 136 136 140 141 135  K 4.0 3.7 4.1 4.3 4.1  CL 105 103 103 100 105  CO2 25 24 23  18* 23  GLUCOSE 82 135* 96 41* 104*  BUN 25* 21 19 20 20   CREATININE 0.89 1.01* 0.98 1.05* 0.99  CALCIUM  8.8* 8.9 9.3 9.0 8.8*  GFRNONAA >60 >60  --   --  >60  PROT 6.8 7.5 6.8 6.7 7.0  ALBUMIN 3.9 4.1 4.7 4.1 3.9  AST 21 27 35 22 27  ALT 21 31 35* 22 32  ALKPHOS 78 81 108 120 91  BILITOT 0.4 0.5 0.3 0.3 0.5     No results found.  ASSESSMENT & PLAN:   Symptomatic anemia # JUNE 2023- [PCP; Hb; 8--platelets/WBC- WNL; Iron  sat: 4%; ferritin: 13 [LL of N- 15] secondary to to iron  deficiency. s/p venofer . HOLD IV venofer .  Currently on iron  sulfate causing constipation.  Recommend gentle iron  every week.   #Etiology of iron  deficiency: pt declines GI evaluation-EGD colonoscopy/ capsule study.    # History of lung nodule/opacity active smoker -CT scan January 2024 -left upper lobe lung resolved.  Previous right upper  lobe lung nodule-improved question scar.  As per radiology- additional chest CT in  12 months to confirm stability- Discussed re: repeating CT scan- pt will talk to Dr.Khan re: follow up CT scan later his week.    # A.fib/ hx of stroke on  Coumadin  [Dr.Khan; cardiology]- stable.  # Low  B12 :200-200-  Recommend  continue B12 sublingual 1000 mcg once a da.    # DISPOSITION: # HOLD venofer  today.  # follow up in  12  months-MD; labs cbc/;cmp; iron  studies ferritin; b12 possible venofer ;-Dr.B  Cc; Dr. Fernand   All questions were answered. The patient knows to call the clinic with any problems, questions or concerns.  Cindy JONELLE Joe, MD 08/08/2023 1:52 PM

## 2023-08-08 NOTE — Assessment & Plan Note (Addendum)
#   JUNE 2023- [PCP; Hb; 8--platelets/WBC- WNL; Iron  sat: 4%; ferritin: 13 [LL of N- 15] secondary to to iron  deficiency. s/p venofer . HOLD IV venofer .  Currently on iron  sulfate causing constipation.  Recommend gentle iron  every week.   #Etiology of iron  deficiency: pt declines GI evaluation-EGD colonoscopy/ capsule study.    # History of lung nodule/opacity active smoker -CT scan January 2024 -left upper lobe lung resolved.  Previous right upper lobe lung nodule-improved question scar.  As per radiology- additional chest CT in  12 months to confirm stability- Discussed re: repeating CT scan- pt will talk to Dr.Khan re: follow up CT scan later his week.    # A.fib/ hx of stroke on  Coumadin  [Dr.Khan; cardiology]- stable.  # Low B12 :200-200-  Recommend  continue B12 sublingual 1000 mcg once a da.    # DISPOSITION: # HOLD venofer  today.  # follow up in  12  months-MD; labs cbc/;cmp; iron  studies ferritin; b12 possible venofer ;-Dr.B  Cc; Dr. Fernand

## 2023-08-08 NOTE — Progress Notes (Signed)
 Fatigue/weakness: NO Dyspena: NO  Light headedness: NO  Blood in stool: NO

## 2023-08-10 ENCOUNTER — Encounter: Payer: Self-pay | Admitting: Cardiovascular Disease

## 2023-08-10 ENCOUNTER — Other Ambulatory Visit: Payer: Self-pay | Admitting: Cardiovascular Disease

## 2023-08-10 ENCOUNTER — Ambulatory Visit: Admitting: Cardiovascular Disease

## 2023-08-10 VITALS — BP 132/62 | HR 49 | Ht 66.0 in | Wt 137.0 lb

## 2023-08-10 DIAGNOSIS — E782 Mixed hyperlipidemia: Secondary | ICD-10-CM

## 2023-08-10 DIAGNOSIS — I480 Paroxysmal atrial fibrillation: Secondary | ICD-10-CM

## 2023-08-10 DIAGNOSIS — D689 Coagulation defect, unspecified: Secondary | ICD-10-CM | POA: Diagnosis not present

## 2023-08-10 DIAGNOSIS — I10 Essential (primary) hypertension: Secondary | ICD-10-CM

## 2023-08-10 NOTE — Progress Notes (Signed)
 Cardiology Office Note   Date:  08/10/2023   ID:  Kristin, Haas 29-Apr-1955, MRN [id number]  PCP:  Orlean Alan HERO, FNP  Cardiologist:  Denyse Bathe, MD      History of Present Illness: Kristin Haas is a 68 y.o. female who presents for  Chief Complaint  Patient presents with   Follow-up    3 months follow up    Feeling better, had good labs from cancer center.      Past Medical History:  Diagnosis Date   Anxiety    Atrial fibrillation (HCC)    Closed 3-part fracture of proximal humerus, right, initial encounter 04/17/2022   Complication of anesthesia    difficulty waking up   COPD (chronic obstructive pulmonary disease) (HCC)    Depression    Dysrhythmia    A-fib   Flushing 09/06/2015   Headache    Hypercholesteremia    Hypertension    Palpitation 09/06/2015   Rectus sheath hematoma, initial encounter 02/04/2020     Past Surgical History:  Procedure Laterality Date   HERNIA REPAIR     ORIF HUMERUS FRACTURE Right 04/20/2022   Procedure: OPEN REDUCTION INTERNAL  FIXATION (ORIF) RIGHT PROXIMAL HUMERUS FRACTURE & 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     amiodarone  (PACERONE ) 200 MG tablet Take 1 tablet by mouth once daily 30 tablet 0   busPIRone  (BUSPAR ) 5 MG tablet Take 1 tablet by mouth once daily 30 tablet 0   ciclopirox  (PENLAC ) 8 % solution Apply topically at bedtime. Apply over nail and surrounding skin. Apply daily over previous coat. After seven (7) days, may remove with alcohol and continue cycle. 6.6 mL 0   citalopram  (CELEXA ) 40 MG tablet Take 1 tablet by mouth once daily 90 tablet 0   Cyanocobalamin  (B-12) 1000 MCG SUBL Place 1,000 mcg  under the tongue once a week. B-12 Drops     ferrous sulfate 324 MG TBEC Take 324 mg by mouth once a week.     losartan  (COZAAR ) 50 MG tablet Take 1 tablet (50 mg total) by mouth daily. 90 tablet 3   Melatonin 10 MG TABS Take 10 mg by mouth at bedtime.     metoprolol  succinate (TOPROL -XL) 25 MG 24 hr tablet Take 1 tablet by mouth once daily 90 tablet 0   rosuvastatin  (CRESTOR ) 40 MG tablet Take 1 tablet (40 mg total) by mouth every evening. 90 tablet 1   Vitamin D , Ergocalciferol , (DRISDOL ) 1.25 MG (50000 UNIT) CAPS capsule Take 1 capsule (50,000 Units total) by mouth every 7 (seven) days. 12 capsule 3   warfarin (COUMADIN ) 2 MG tablet Take 1 tablet by mouth once daily 90 tablet 0   No current facility-administered medications for this visit.    Allergies:   Codeine    Social History:   reports that she has been smoking cigarettes. She has a 25 pack-year smoking history. She has never used smokeless tobacco. She reports current alcohol use. She reports that she does not use drugs.   Family History:  family history includes Cervical cancer in her paternal grandmother; Hypertension in her father and sister; Lung  cancer in her father and son; Uterine cancer in her maternal grandmother.    ROS:     Review of Systems  Constitutional: Negative.   HENT: Negative.    Eyes: Negative.   Respiratory: Negative.    Gastrointestinal: Negative.   Genitourinary: Negative.   Musculoskeletal: Negative.   Skin: Negative.   Neurological: Negative.   Endo/Heme/Allergies: Negative.   Psychiatric/Behavioral: Negative.    All other systems reviewed and are negative.     All other systems are reviewed and negative.    PHYSICAL EXAM: VS:  BP 132/62   Pulse (!) 49   Ht 5' 6 (1.676 m)   Wt 137 lb (62.1 kg)   SpO2 96%   BMI 22.11 kg/m  , BMI Body mass index is 22.11 kg/m. Last weight:  Wt Readings from Last 3 Encounters:  08/10/23 137 lb (62.1 kg)  08/08/23 140 lb (63.5 kg)  07/05/23 139 lb  (63 kg)     Physical Exam Constitutional:      Appearance: Normal appearance.  Cardiovascular:     Rate and Rhythm: Normal rate and regular rhythm.     Heart sounds: Normal heart sounds.  Pulmonary:     Effort: Pulmonary effort is normal.     Breath sounds: Normal breath sounds.  Musculoskeletal:     Right lower leg: No edema.     Left lower leg: No edema.  Neurological:     Mental Status: She is alert.       EKG:   Recent Labs: 07/05/2023: TSH 2.070 08/08/2023: ALT 32; BUN 20; Creatinine 0.99; Hemoglobin 13.1; Platelet Count 208; Potassium 4.1; Sodium 135    Lipid Panel    Component Value Date/Time   CHOL 135 07/05/2023 1121   TRIG 134 07/05/2023 1121   HDL 47 07/05/2023 1121   CHOLHDL 2.9 07/05/2023 1121   CHOLHDL 4.6 09/06/2015 1324   VLDL 29 09/06/2015 1324   LDLCALC 65 07/05/2023 1121      Other studies Reviewed: Additional studies/ records that were reviewed today include:  Review of the above records demonstrates:       No data to display            ASSESSMENT AND PLAN:    ICD-10-CM   1. Paroxysmal atrial fibrillation (HCC)  I48.0    In NSR on amiodrone. Stable.    2. Primary hypertension  I10    stable    3. Mixed hyperlipidemia  E78.2        Problem List Items Addressed This Visit       Cardiovascular and Mediastinum   A-fib (HCC) - Primary   HTN (hypertension)     Other   HLD (hyperlipidemia)       Disposition:   Return in about 3 months (around 11/10/2023).    Total time spent: 30 minutes  Signed,  Denyse Bathe, MD  08/10/2023 10:44 AM    [facility]

## 2023-08-11 LAB — PROTIME-INR
INR: 3.8 — ABNORMAL HIGH (ref 0.9–1.2)
Prothrombin Time: 37.6 s — ABNORMAL HIGH (ref 9.1–12.0)

## 2023-08-13 ENCOUNTER — Ambulatory Visit: Payer: Self-pay

## 2023-08-25 ENCOUNTER — Other Ambulatory Visit: Payer: Self-pay | Admitting: Family

## 2023-08-26 ENCOUNTER — Other Ambulatory Visit: Payer: Self-pay | Admitting: Cardiology

## 2023-08-26 ENCOUNTER — Other Ambulatory Visit: Payer: Self-pay | Admitting: Family

## 2023-08-28 ENCOUNTER — Other Ambulatory Visit: Payer: Self-pay

## 2023-08-28 DIAGNOSIS — I480 Paroxysmal atrial fibrillation: Secondary | ICD-10-CM

## 2023-09-03 DIAGNOSIS — I480 Paroxysmal atrial fibrillation: Secondary | ICD-10-CM | POA: Diagnosis not present

## 2023-09-04 ENCOUNTER — Ambulatory Visit: Payer: Self-pay | Admitting: Cardiology

## 2023-09-04 LAB — PROTIME-INR
INR: 1.3 — ABNORMAL HIGH (ref 0.9–1.2)
Prothrombin Time: 13.8 s — ABNORMAL HIGH (ref 9.1–12.0)

## 2023-09-04 LAB — APTT: aPTT: 30 s (ref 24–33)

## 2023-09-05 NOTE — Progress Notes (Signed)
 Patient notified

## 2023-09-10 ENCOUNTER — Other Ambulatory Visit: Payer: Self-pay

## 2023-09-10 DIAGNOSIS — I480 Paroxysmal atrial fibrillation: Secondary | ICD-10-CM

## 2023-09-13 DIAGNOSIS — I480 Paroxysmal atrial fibrillation: Secondary | ICD-10-CM | POA: Diagnosis not present

## 2023-09-14 LAB — APTT: aPTT: 40 s — ABNORMAL HIGH (ref 24–33)

## 2023-09-14 LAB — PROTIME-INR
INR: 2.5 — ABNORMAL HIGH (ref 0.9–1.2)
Prothrombin Time: 25.6 s — ABNORMAL HIGH (ref 9.1–12.0)

## 2023-09-18 ENCOUNTER — Ambulatory Visit: Payer: Self-pay

## 2023-09-26 ENCOUNTER — Other Ambulatory Visit: Payer: Self-pay | Admitting: Family

## 2023-10-05 ENCOUNTER — Encounter: Payer: Self-pay | Admitting: Family

## 2023-10-05 ENCOUNTER — Ambulatory Visit (INDEPENDENT_AMBULATORY_CARE_PROVIDER_SITE_OTHER): Admitting: Family

## 2023-10-05 VITALS — BP 122/60 | HR 49 | Ht 66.0 in | Wt 144.0 lb

## 2023-10-05 DIAGNOSIS — I10 Essential (primary) hypertension: Secondary | ICD-10-CM | POA: Diagnosis not present

## 2023-10-05 DIAGNOSIS — E782 Mixed hyperlipidemia: Secondary | ICD-10-CM

## 2023-10-05 DIAGNOSIS — E538 Deficiency of other specified B group vitamins: Secondary | ICD-10-CM

## 2023-10-05 DIAGNOSIS — Z1231 Encounter for screening mammogram for malignant neoplasm of breast: Secondary | ICD-10-CM

## 2023-10-05 DIAGNOSIS — I480 Paroxysmal atrial fibrillation: Secondary | ICD-10-CM

## 2023-10-05 DIAGNOSIS — E559 Vitamin D deficiency, unspecified: Secondary | ICD-10-CM | POA: Diagnosis not present

## 2023-10-05 DIAGNOSIS — R7303 Prediabetes: Secondary | ICD-10-CM

## 2023-10-05 DIAGNOSIS — R5383 Other fatigue: Secondary | ICD-10-CM

## 2023-10-05 DIAGNOSIS — Z122 Encounter for screening for malignant neoplasm of respiratory organs: Secondary | ICD-10-CM | POA: Diagnosis not present

## 2023-10-05 DIAGNOSIS — E039 Hypothyroidism, unspecified: Secondary | ICD-10-CM | POA: Diagnosis not present

## 2023-10-05 NOTE — Progress Notes (Signed)
 Established Patient Office Visit  Subjective:  Patient ID: Kristin Haas, female    DOB: November 20, 1955  Age: 68 y.o. MRN: [id number]  Chief Complaint  Patient presents with   Follow-up    3 month follow up    Patient is here today for her 3 months follow up.  She has been feeling fairly well since last appointment.   She does not have additional concerns to discuss today.  Labs are due today.  She needs refills.   I have reviewed her active problem list, medication list, allergies, notes from last encounter, lab results for her appointment today.      No other concerns at this time.   Past Medical History:  Diagnosis Date   Anxiety    Atrial fibrillation (HCC)    Closed 3-part fracture of proximal humerus, right, initial encounter 04/17/2022   Complication of anesthesia    difficulty waking up   COPD (chronic obstructive pulmonary disease) (HCC)    Depression    Dysrhythmia    A-fib   Flushing 09/06/2015   Headache    Hypercholesteremia    Hypertension    Palpitation 09/06/2015   Rectus sheath hematoma, initial encounter 02/04/2020    Past Surgical History:  Procedure Laterality Date   HERNIA REPAIR     ORIF HUMERUS FRACTURE Right 04/20/2022   Procedure: OPEN REDUCTION INTERNAL  FIXATION (ORIF) RIGHT PROXIMAL HUMERUS FRACTURE & BONE MARROW ASPIRATE FROM ILIAC CREST;  Surgeon: Addie Cordella Hamilton, MD;  Location: [facility] OR;  Service: Orthopedics;  Laterality: Right;   TONSILLECTOMY     TUBAL LIGATION      Social History   Socioeconomic History   Marital status: Single    Spouse name: Not on file   Number of children: Not on file   Years of education: Not on file   Highest education level: Not on file  Occupational History   Not on file  Tobacco Use   Smoking status: Every Day    Current packs/day: 0.50    Average packs/day: 0.5 packs/day for 50.0 years (25.0 ttl pk-yrs)    Types: Cigarettes   Smokeless tobacco: Never   Tobacco comments:    Patient down to  2 packs/week, which is approximately 1/2 of her previous intake.   Vaping Use   Vaping status: Never Used  Substance and Sexual Activity   Alcohol use: Yes    Comment: occasional/maybe once a year   Drug use: No   Sexual activity: Never  Other Topics Concern   Not on file  Social History Narrative   Patient used to be a Production designer, theatre/television/film in a warehouse.  Currently retired.  Active smoker.  No significant alcohol.   Social Drivers of Corporate investment banker Strain: Low Risk  (01/04/2023)   Overall Financial Resource Strain (CARDIA)    Difficulty of Paying Living Expenses: Not very hard  Food Insecurity: No Food Insecurity (01/04/2023)   Hunger Vital Sign    Worried About Running Out of Food in the Last Year: Never true    Ran Out of Food in the Last Year: Never true  Transportation Needs: No Transportation Needs (01/04/2023)   PRAPARE - Administrator, Civil Service (Medical): No    Lack of Transportation (Non-Medical): No  Physical Activity: Inactive (01/04/2023)   Exercise Vital Sign    Days of Exercise per Week: 0 days    Minutes of Exercise per Session: 0 min  Stress: No Stress Concern Present (01/04/2023)  [employer] of Occupational Health - Occupational Stress Questionnaire    Feeling of Stress : Only a little  Social Connections: Moderately Isolated (01/04/2023)   Social Connection and Isolation Panel    Frequency of Communication with Friends and Family: More than three times a week    Frequency of Social Gatherings with Friends and Family: More than three times a week    Attends Religious Services: More than 4 times per year    Active Member of [employer] or Organizations: No    Attends Banker Meetings: Never    Marital Status: Widowed  Intimate Partner Violence: Not At Risk (01/04/2023)   Humiliation, Afraid, Rape, and Kick questionnaire    Fear of Current or Ex-Partner: No    Emotionally Abused: No    Physically Abused: No    Sexually  Abused: No    Family History  Problem Relation Age of Onset   Hypertension Father    Lung cancer Father    Hypertension Sister    Uterine cancer Maternal Grandmother    Cervical cancer Paternal Grandmother    Lung cancer Son     Allergies  Allergen Reactions   Codeine Nausea And Vomiting    Review of Systems  All other systems reviewed and are negative.      Objective:   BP 122/60   Pulse (!) 49   Ht 5' 6 (1.676 m)   Wt 144 lb (65.3 kg)   SpO2 97%   BMI 23.24 kg/m   Vitals:   10/05/23 1046  BP: 122/60  Pulse: (!) 49  Height: 5' 6 (1.676 m)  Weight: 144 lb (65.3 kg)  SpO2: 97%  BMI (Calculated): 23.25    Physical Exam Vitals and nursing note reviewed.  Constitutional:      Appearance: Normal appearance. She is normal weight.  HENT:     Head: Normocephalic.  Eyes:     Extraocular Movements: Extraocular movements intact.     Conjunctiva/sclera: Conjunctivae normal.     Pupils: Pupils are equal, round, and reactive to light.  Cardiovascular:     Rate and Rhythm: Normal rate.  Pulmonary:     Effort: Pulmonary effort is normal.  Neurological:     General: No focal deficit present.     Mental Status: She is alert and oriented to person, place, and time. Mental status is at baseline.  Psychiatric:        Mood and Affect: Mood normal.        Behavior: Behavior normal.        Thought Content: Thought content normal.        Judgment: Judgment normal.      No results found for any visits on 10/05/23.  Recent Results (from the past 2160 hours)  POCT Urinalysis Dipstick (18997)     Status: Abnormal   Collection Time: 07/12/23  1:34 PM  Result Value Ref Range   Color, UA Yellow    Clarity, UA Clear    Glucose, UA Negative Negative   Bilirubin, UA Negative    Ketones, UA Negative    Spec Grav, UA 1.015 1.010 - 1.025   Blood, UA Trace    pH, UA 7.5 5.0 - 8.0   Protein, UA Positive (A) Negative   Urobilinogen, UA 0.2 0.2 or 1.0 E.U./dL   Nitrite,  UA Negative    Leukocytes, UA Small (1+) (A) Negative   Appearance Clear    Odor Yes   Urinalysis, Routine w reflex microscopic  Status: Abnormal   Collection Time: 07/12/23  3:01 PM  Result Value Ref Range   Specific Gravity, UA 1.007 1.005 - 1.030   pH, UA 8.0 (H) 5.0 - 7.5   Color, UA Yellow Yellow   Appearance Ur Clear Clear   Leukocytes,UA 1+ (A) Negative   Protein,UA 1+ (A) Negative/Trace   Glucose, UA Negative Negative   Ketones, UA Negative Negative   RBC, UA Negative Negative   Bilirubin, UA Negative Negative   Urobilinogen, Ur 0.2 0.2 - 1.0 mg/dL   Nitrite, UA Negative Negative   Microscopic Examination See below:     Comment: Microscopic was indicated and was performed.  Microscopic Examination     Status: None   Collection Time: 07/12/23  3:01 PM  Result Value Ref Range   WBC, UA None seen 0 - 5 /hpf   RBC, Urine 0-2 0 - 2 /hpf   Epithelial Cells (non renal) 0-10 0 - 10 /hpf   Casts None seen None seen /lpf   Bacteria, UA None seen None seen/Few  Urine Culture     Status: None   Collection Time: 07/12/23  3:02 PM   Specimen: Urine, Clean Catch   UR  Result Value Ref Range   Urine Culture, Routine Final report    Organism ID, Bacteria Comment     Comment: Mixed urogenital flora 10,000-25,000 colony forming units per mL   CBC with Differential (Cancer Center Only)     Status: None   Collection Time: 08/08/23 12:30 PM  Result Value Ref Range   WBC Count 7.8 4.0 - 10.5 K/uL   RBC 4.41 3.87 - 5.11 MIL/uL   Hemoglobin 13.1 12.0 - 15.0 g/dL   HCT 60.2 63.9 - 53.9 %   MCV 90.0 80.0 - 100.0 fL   MCH 29.7 26.0 - 34.0 pg   MCHC 33.0 30.0 - 36.0 g/dL   RDW 85.3 88.4 - 84.4 %   Platelet Count 208 150 - 400 K/uL   nRBC 0.0 0.0 - 0.2 %   Neutrophils Relative % 72 %   Neutro Abs 5.7 1.7 - 7.7 K/uL   Lymphocytes Relative 18 %   Lymphs Abs 1.4 0.7 - 4.0 K/uL   Monocytes Relative 6 %   Monocytes Absolute 0.5 0.1 - 1.0 K/uL   Eosinophils Relative 2 %    Eosinophils Absolute 0.1 0.0 - 0.5 K/uL   Basophils Relative 1 %   Basophils Absolute 0.1 0.0 - 0.1 K/uL   Immature Granulocytes 1 %   Abs Immature Granulocytes 0.05 0.00 - 0.07 K/uL    Comment: Performed at [facility], [address] [place], [place] [address]  CMP (Cancer Center only)     Status: Abnormal   Collection Time: 08/08/23 12:30 PM  Result Value Ref Range   Sodium 135 135 - 145 mmol/L   Potassium 4.1 3.5 - 5.1 mmol/L   Chloride 105 98 - 111 mmol/L   CO2 23 22 - 32 mmol/L   Glucose, Bld 104 (H) 70 - 99 mg/dL    Comment: Glucose reference range applies only to samples taken after fasting for at least 8 hours.   BUN 20 8 - 23 mg/dL   Creatinine 9.00 9.55 - 1.00 mg/dL   Calcium  8.8 (L) 8.9 - 10.3 mg/dL   Total Protein 7.0 6.5 - 8.1 g/dL   Albumin 3.9 3.5 - 5.0 g/dL   AST 27 15 - 41 U/L   ALT 32 0 - 44 U/L  Alkaline Phosphatase 91 38 - 126 U/L   Total Bilirubin 0.5 0.0 - 1.2 mg/dL   GFR, Estimated >39 >39 mL/min    Comment: (NOTE) Calculated using the CKD-EPI Creatinine Equation (2021)    Anion gap 7 5 - 15    Comment: Performed at [facility], [address] [place], [place] [address]  Iron  and TIBC     Status: None   Collection Time: 08/08/23 12:30 PM  Result Value Ref Range   Iron  69 28 - 170 ug/dL   TIBC 627 749 - 549 ug/dL   Saturation Ratios 19 10.4 - 31.8 %   UIBC 303 ug/dL    Comment: Performed at [facility], [address] [place], [place] [address]  Ferritin     Status: None   Collection Time: 08/08/23 12:30 PM  Result Value Ref Range   Ferritin 48 11 - 307 ng/mL    Comment: Performed at [facility], [address] [place], [place] [address]  Protime-INR     Status: Abnormal   Collection Time: 08/10/23 10:50 AM  Result Value Ref Range   INR 3.8 (H) 0.9 - 1.2    Comment: Reference interval is for non-anticoagulated patients. Suggested INR therapeutic range for Vitamin K  antagonist therapy:    Standard Dose  (moderate intensity                   therapeutic range):       2.0 - 3.0    Higher intensity therapeutic range       2.5 - 3.5    Prothrombin  Time 37.6 (H) 9.1 - 12.0 sec  APTT     Status: None   Collection Time: 09/03/23 11:41 AM  Result Value Ref Range   aPTT 30 24 - 33 sec    Comment: This test has not been validated for monitoring unfractionated heparin  therapy. aPTT-based therapeutic ranges for unfractionated heparin  therapy have not been established. For general guidelines on Heparin  monitoring, refer to the [employer].   Protime-INR     Status: Abnormal   Collection Time: 09/03/23 11:41 AM  Result Value Ref Range   INR 1.3 (H) 0.9 - 1.2    Comment: Reference interval is for non-anticoagulated patients. Suggested INR therapeutic range for Vitamin K  antagonist therapy:    Standard Dose (moderate intensity                   therapeutic range):       2.0 - 3.0    Higher intensity therapeutic range       2.5 - 3.5    Prothrombin  Time 13.8 (H) 9.1 - 12.0 sec  APTT     Status: Abnormal   Collection Time: 09/13/23 11:40 AM  Result Value Ref Range   aPTT 40 (H) 24 - 33 sec    Comment: This test has not been validated for monitoring unfractionated heparin  therapy. aPTT-based therapeutic ranges for unfractionated heparin  therapy have not been established. For general guidelines on Heparin  monitoring, refer to the [employer].   Protime-INR     Status: Abnormal   Collection Time: 09/13/23 11:40 AM  Result Value Ref Range   INR 2.5 (H) 0.9 - 1.2    Comment: Reference interval is for non-anticoagulated patients. Suggested INR therapeutic range for Vitamin K  antagonist therapy:    Standard Dose (moderate intensity  therapeutic range):       2.0 - 3.0    Higher intensity therapeutic range       2.5 - 3.5    Prothrombin  Time 25.6 (H) 9.1 - 12.0 sec       Assessment & Plan Screening for lung cancer Test ordered today.   Pt aware they will call to schedule.  Screening mammogram for breast cancer Test ordered today. Pt aware the will call her to schedule.  Will call with results when available.  Paroxysmal atrial fibrillation [facility]) Patient is seen by cardiology, who manage this condition.  She is well controlled with current therapy.   Will defer to them for further changes to plan of care.  Primary hypertension Blood pressure well controlled with current medications.  Continue current therapy.  Will reassess at follow up.   - CBC w/Diff - CMP w/eGFR  Mixed hyperlipidemia Checking labs today.  Continue current therapy for lipid control. Will modify as needed based on labwork results.   -CMP w/eGFR -Lipid Panel  Prediabetes A1C Continues to be in prediabetic ranges.  Will reassess at follow up after next lab check.  Patient counseled on dietary choices and verbalized understanding.   -CBC w/Diff -CMP w/eGFR -Hemoglobin A1C  Vitamin D  deficiency, unspecified B12 deficiency due to diet Other fatigue Hypothyroidism (acquired) Checking labs today.  Will continue supplements as needed.   - Vitamin D  - Vitamin B12 - TSH     Return in about 3 months (around 01/04/2024) for AWV.   Total time spent: 20 minutes  ALAN CHRISTELLA ARRANT, FNP  10/05/2023   This document may have been prepared by [facility] Voice Recognition software and as such may include unintentional dictation errors.

## 2023-10-05 NOTE — Assessment & Plan Note (Signed)
 Blood pressure well controlled with current medications.  Continue current therapy.  Will reassess at follow up.   - CBC w/Diff - CMP w/eGFR

## 2023-10-05 NOTE — Assessment & Plan Note (Signed)
Patient is seen by cardiology, who manage this condition.  She is well controlled with current therapy.   Will defer to them for further changes to plan of care.

## 2023-10-05 NOTE — Assessment & Plan Note (Signed)
 Checking labs today.  Continue current therapy for lipid control. Will modify as needed based on labwork results.   -CMP w/eGFR -Lipid Panel

## 2023-10-06 LAB — CBC WITH DIFFERENTIAL/PLATELET
Basophils Absolute: 0.1 x10E3/uL (ref 0.0–0.2)
Basos: 1 %
EOS (ABSOLUTE): 0.1 x10E3/uL (ref 0.0–0.4)
Eos: 2 %
Hematocrit: 43.3 % (ref 34.0–46.6)
Hemoglobin: 13.6 g/dL (ref 11.1–15.9)
Immature Grans (Abs): 0 x10E3/uL (ref 0.0–0.1)
Immature Granulocytes: 0 %
Lymphocytes Absolute: 1.9 x10E3/uL (ref 0.7–3.1)
Lymphs: 24 %
MCH: 29.7 pg (ref 26.6–33.0)
MCHC: 31.4 g/dL — ABNORMAL LOW (ref 31.5–35.7)
MCV: 95 fL (ref 79–97)
Monocytes Absolute: 0.5 x10E3/uL (ref 0.1–0.9)
Monocytes: 6 %
Neutrophils Absolute: 5.2 x10E3/uL (ref 1.4–7.0)
Neutrophils: 67 %
Platelets: 184 x10E3/uL (ref 150–450)
RBC: 4.58 x10E6/uL (ref 3.77–5.28)
RDW: 13.9 % (ref 11.7–15.4)
WBC: 7.7 x10E3/uL (ref 3.4–10.8)

## 2023-10-06 LAB — CMP14+EGFR
ALT: 40 IU/L — ABNORMAL HIGH (ref 0–32)
AST: 33 IU/L (ref 0–40)
Albumin: 4.4 g/dL (ref 3.9–4.9)
Alkaline Phosphatase: 111 IU/L (ref 49–135)
BUN/Creatinine Ratio: 19 (ref 12–28)
BUN: 17 mg/dL (ref 8–27)
Bilirubin Total: 0.3 mg/dL (ref 0.0–1.2)
CO2: 23 mmol/L (ref 20–29)
Calcium: 9.2 mg/dL (ref 8.7–10.3)
Chloride: 103 mmol/L (ref 96–106)
Creatinine, Ser: 0.89 mg/dL (ref 0.57–1.00)
Globulin, Total: 2.1 g/dL (ref 1.5–4.5)
Glucose: 98 mg/dL (ref 70–99)
Potassium: 5 mmol/L (ref 3.5–5.2)
Sodium: 140 mmol/L (ref 134–144)
Total Protein: 6.5 g/dL (ref 6.0–8.5)
eGFR: 70 mL/min/1.73 (ref >59)

## 2023-10-06 LAB — LIPID PANEL
Chol/HDL Ratio: 3 ratio (ref 0.0–4.4)
Cholesterol, Total: 147 mg/dL (ref 100–199)
HDL: 49 mg/dL (ref >39)
LDL Chol Calc (NIH): 70 mg/dL (ref 0–99)
Triglycerides: 162 mg/dL — ABNORMAL HIGH (ref 0–149)
VLDL Cholesterol Cal: 28 mg/dL (ref 5–40)

## 2023-10-06 LAB — VITAMIN B12: Vitamin B-12: 325 pg/mL (ref 232–1245)

## 2023-10-06 LAB — TSH: TSH: 2.15 u[IU]/mL (ref 0.450–4.500)

## 2023-10-06 LAB — HEMOGLOBIN A1C
Est. average glucose Bld gHb Est-mCnc: 108 mg/dL
Hgb A1c MFr Bld: 5.4 % (ref 4.8–5.6)

## 2023-10-06 LAB — VITAMIN D 25 HYDROXY (VIT D DEFICIENCY, FRACTURES): Vit D, 25-Hydroxy: 22.3 ng/mL — ABNORMAL LOW (ref 30.0–100.0)

## 2023-10-24 ENCOUNTER — Other Ambulatory Visit: Payer: Self-pay | Admitting: Family

## 2023-11-03 ENCOUNTER — Other Ambulatory Visit: Payer: Self-pay | Admitting: Cardiovascular Disease

## 2023-11-06 ENCOUNTER — Encounter: Payer: Self-pay | Admitting: Cardiovascular Disease

## 2023-11-12 ENCOUNTER — Ambulatory Visit: Admitting: Cardiovascular Disease

## 2023-11-12 ENCOUNTER — Other Ambulatory Visit: Payer: Self-pay

## 2023-11-12 ENCOUNTER — Encounter: Payer: Self-pay | Admitting: Cardiovascular Disease

## 2023-11-12 ENCOUNTER — Other Ambulatory Visit: Payer: Self-pay | Admitting: Cardiovascular Disease

## 2023-11-12 VITALS — BP 136/68 | HR 54 | Ht 66.0 in | Wt 143.0 lb

## 2023-11-12 DIAGNOSIS — E782 Mixed hyperlipidemia: Secondary | ICD-10-CM | POA: Diagnosis not present

## 2023-11-12 DIAGNOSIS — R001 Bradycardia, unspecified: Secondary | ICD-10-CM

## 2023-11-12 DIAGNOSIS — I10 Essential (primary) hypertension: Secondary | ICD-10-CM

## 2023-11-12 DIAGNOSIS — R0602 Shortness of breath: Secondary | ICD-10-CM | POA: Diagnosis not present

## 2023-11-12 DIAGNOSIS — I480 Paroxysmal atrial fibrillation: Secondary | ICD-10-CM

## 2023-11-12 DIAGNOSIS — D689 Coagulation defect, unspecified: Secondary | ICD-10-CM | POA: Diagnosis not present

## 2023-11-12 MED ORDER — LOSARTAN POTASSIUM 100 MG PO TABS: 100.0000 mg | ORAL_TABLET | Freq: Every day | ORAL | 11 refills | Status: AC

## 2023-11-12 NOTE — Progress Notes (Signed)
 Cardiology Office Note   Date:  11/12/2023   ID:  Kristin Haas, Kristin Haas Dec 02, 1955, MRN [id number]  PCP:  Orlean Alan HERO, FNP  Cardiologist:  Denyse Bathe, MD      History of Present Illness: Kristin Haas is a 68 y.o. female who presents for  Chief Complaint  Patient presents with   Follow-up    3 months follow up    Has been running high BP.      Past Medical History:  Diagnosis Date   Anxiety    Atrial fibrillation (HCC)    Closed 3-part fracture of proximal humerus, right, initial encounter 04/17/2022   Complication of anesthesia    difficulty waking up   COPD (chronic obstructive pulmonary disease) (HCC)    Depression    Dysrhythmia    A-fib   Flushing 09/06/2015   Headache    Hypercholesteremia    Hypertension    Palpitation 09/06/2015   Rectus sheath hematoma, initial encounter 02/04/2020     Past Surgical History:  Procedure Laterality Date   HERNIA REPAIR     ORIF HUMERUS FRACTURE Right 04/20/2022   Procedure: OPEN REDUCTION INTERNAL  FIXATION (ORIF) RIGHT PROXIMAL HUMERUS FRACTURE & 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     amiodarone  (PACERONE ) 200 MG tablet Take 1 tablet by mouth once daily 30 tablet 0   busPIRone  (BUSPAR ) 5 MG tablet Take 1 tablet by mouth once daily 30 tablet 0   ciclopirox  (PENLAC ) 8 % solution Apply topically at bedtime. Apply over nail and surrounding skin. Apply daily over previous coat. After seven (7) days, may remove with alcohol and continue cycle. 6.6 mL 0   citalopram  (CELEXA ) 40 MG tablet Take 1 tablet by mouth once daily 90 tablet 0   Cyanocobalamin  (B-12) 1000 MCG SUBL Place 1,000 mcg under the tongue once  a week. B-12 Drops     ferrous sulfate 324 MG TBEC Take 324 mg by mouth once a week.     losartan  (COZAAR ) 100 MG tablet Take 1 tablet (100 mg total) by mouth daily. 30 tablet 11   Melatonin 10 MG TABS Take 10 mg by mouth at bedtime.     metoprolol  succinate (TOPROL -XL) 25 MG 24 hr tablet Take 1 tablet by mouth once daily 90 tablet 0   rosuvastatin  (CRESTOR ) 40 MG tablet Take 1 tablet (40 mg total) by mouth every evening. 90 tablet 1   Vitamin D , Ergocalciferol , (DRISDOL ) 1.25 MG (50000 UNIT) CAPS capsule Take 1 capsule (50,000 Units total) by mouth every 7 (seven) days. 12 capsule 3   warfarin (COUMADIN ) 2 MG tablet Take 1 tablet by mouth once daily 90 tablet 0   No current facility-administered medications for this visit.    Allergies:   Codeine    Social History:   reports that she has been smoking cigarettes. She has a 25 pack-year smoking history. She has never used smokeless tobacco. She reports current alcohol use. She reports that she does not use drugs.   Family History:  family history includes Cervical cancer in her paternal grandmother; Hypertension in her father and sister; Lung cancer in her  father and son; Uterine cancer in her maternal grandmother.    ROS:     Review of Systems  Constitutional: Negative.   HENT: Negative.    Eyes: Negative.   Respiratory: Negative.    Gastrointestinal: Negative.   Genitourinary: Negative.   Musculoskeletal: Negative.   Skin: Negative.   Neurological: Negative.   Endo/Heme/Allergies: Negative.   Psychiatric/Behavioral: Negative.    All other systems reviewed and are negative.     All other systems are reviewed and negative.    PHYSICAL EXAM: VS:  BP 136/68   Pulse (!) 54   Ht 5' 6 (1.676 m)   Wt 143 lb (64.9 kg)   SpO2 96%   BMI 23.08 kg/m  , BMI Body mass index is 23.08 kg/m. Last weight:  Wt Readings from Last 3 Encounters:  11/12/23 143 lb (64.9 kg)  10/05/23 144 lb (65.3 kg)  08/10/23 137 lb (62.1 kg)      Physical Exam Constitutional:      Appearance: Normal appearance.  Cardiovascular:     Rate and Rhythm: Normal rate and regular rhythm.     Heart sounds: Normal heart sounds.  Pulmonary:     Effort: Pulmonary effort is normal.     Breath sounds: Normal breath sounds.  Musculoskeletal:     Right lower leg: No edema.     Left lower leg: No edema.  Neurological:     Mental Status: She is alert.       EKG:   Recent Labs: 10/05/2023: ALT 40; BUN 17; Creatinine, Ser 0.89; Hemoglobin 13.6; Platelets 184; Potassium 5.0; Sodium 140; TSH 2.150    Lipid Panel    Component Value Date/Time   CHOL 147 10/05/2023 1118   TRIG 162 (H) 10/05/2023 1118   HDL 49 10/05/2023 1118   CHOLHDL 3.0 10/05/2023 1118   CHOLHDL 4.6 09/06/2015 1324   VLDL 29 09/06/2015 1324   LDLCALC 70 10/05/2023 1118      Other studies Reviewed: Additional studies/ records that were reviewed today include:  Review of the above records demonstrates:       No data to display            ASSESSMENT AND PLAN:    ICD-10-CM   1. Paroxysmal atrial fibrillation (HCC)  I48.0 losartan  (COZAAR ) 100 MG tablet    PCV ECHOCARDIOGRAM COMPLETE    2. Mixed hyperlipidemia  E78.2 losartan  (COZAAR ) 100 MG tablet    PCV ECHOCARDIOGRAM COMPLETE    3. Primary hypertension  I10 losartan  (COZAAR ) 100 MG tablet    PCV ECHOCARDIOGRAM COMPLETE    4. SOB (shortness of breath)  R06.02 losartan  (COZAAR ) 100 MG tablet    PCV ECHOCARDIOGRAM COMPLETE    5. Sinus bradycardia  R00.1 losartan  (COZAAR ) 100 MG tablet    PCV ECHOCARDIOGRAM COMPLETE       Problem List Items Addressed This Visit       Cardiovascular and Mediastinum   A-fib (HCC) - Primary   Relevant Medications   losartan  (COZAAR ) 100 MG tablet   Other Relevant Orders   PCV ECHOCARDIOGRAM COMPLETE   HTN (hypertension)   Relevant Medications   losartan  (COZAAR ) 100 MG tablet   Other Relevant Orders   PCV ECHOCARDIOGRAM COMPLETE     Other   HLD  (hyperlipidemia)   Relevant Medications   losartan  (COZAAR ) 100 MG tablet   Other Relevant Orders   PCV ECHOCARDIOGRAM COMPLETE   Other Visit Diagnoses       SOB (shortness of breath)  Relevant Medications   losartan  (COZAAR ) 100 MG tablet   Other Relevant Orders   PCV ECHOCARDIOGRAM COMPLETE     Sinus bradycardia       Relevant Medications   losartan  (COZAAR ) 100 MG tablet   Other Relevant Orders   PCV ECHOCARDIOGRAM COMPLETE          Disposition:   Return in about 4 weeks (around 12/10/2023) for echo and f/u.    Total time spent: 35 minutes  Signed,  Denyse Bathe, MD  11/12/2023 10:49 AM    [facility]

## 2023-11-13 ENCOUNTER — Ambulatory Visit: Payer: Self-pay

## 2023-11-13 LAB — PROTIME-INR
INR: 2.7 — ABNORMAL HIGH (ref 0.9–1.2)
Prothrombin Time: 27.7 s — ABNORMAL HIGH (ref 9.1–12.0)

## 2023-11-20 NOTE — Telephone Encounter (Signed)
 Already been seen by provider

## 2023-11-23 ENCOUNTER — Other Ambulatory Visit: Payer: Self-pay | Admitting: Family

## 2023-11-25 ENCOUNTER — Other Ambulatory Visit: Payer: Self-pay | Admitting: Cardiology

## 2023-11-25 ENCOUNTER — Other Ambulatory Visit: Payer: Self-pay | Admitting: Family

## 2023-12-07 ENCOUNTER — Other Ambulatory Visit: Payer: Self-pay | Admitting: Cardiovascular Disease

## 2023-12-07 DIAGNOSIS — I10 Essential (primary) hypertension: Secondary | ICD-10-CM

## 2023-12-07 DIAGNOSIS — M80021S Age-related osteoporosis with current pathological fracture, right humerus, sequela: Secondary | ICD-10-CM

## 2023-12-07 DIAGNOSIS — E782 Mixed hyperlipidemia: Secondary | ICD-10-CM

## 2023-12-07 DIAGNOSIS — I480 Paroxysmal atrial fibrillation: Secondary | ICD-10-CM

## 2023-12-12 ENCOUNTER — Other Ambulatory Visit

## 2023-12-18 ENCOUNTER — Ambulatory Visit: Admitting: Cardiovascular Disease

## 2023-12-18 ENCOUNTER — Ambulatory Visit: Payer: Self-pay

## 2023-12-23 ENCOUNTER — Other Ambulatory Visit: Payer: Self-pay | Admitting: Family

## 2023-12-23 DIAGNOSIS — I10 Essential (primary) hypertension: Secondary | ICD-10-CM

## 2023-12-23 DIAGNOSIS — E782 Mixed hyperlipidemia: Secondary | ICD-10-CM

## 2023-12-23 DIAGNOSIS — I480 Paroxysmal atrial fibrillation: Secondary | ICD-10-CM

## 2023-12-26 ENCOUNTER — Other Ambulatory Visit: Payer: Self-pay | Admitting: Family

## 2024-01-04 ENCOUNTER — Ambulatory Visit: Admitting: Family

## 2024-01-04 ENCOUNTER — Encounter: Payer: Self-pay | Admitting: Family

## 2024-01-04 VITALS — BP 128/74 | HR 66 | Ht 66.0 in | Wt 143.4 lb

## 2024-01-04 DIAGNOSIS — J449 Chronic obstructive pulmonary disease, unspecified: Secondary | ICD-10-CM | POA: Insufficient documentation

## 2024-01-04 DIAGNOSIS — E538 Deficiency of other specified B group vitamins: Secondary | ICD-10-CM

## 2024-01-04 DIAGNOSIS — E782 Mixed hyperlipidemia: Secondary | ICD-10-CM

## 2024-01-04 DIAGNOSIS — R7303 Prediabetes: Secondary | ICD-10-CM

## 2024-01-04 DIAGNOSIS — I10 Essential (primary) hypertension: Secondary | ICD-10-CM

## 2024-01-04 DIAGNOSIS — Z122 Encounter for screening for malignant neoplasm of respiratory organs: Secondary | ICD-10-CM

## 2024-01-04 DIAGNOSIS — R5383 Other fatigue: Secondary | ICD-10-CM

## 2024-01-04 DIAGNOSIS — Z1231 Encounter for screening mammogram for malignant neoplasm of breast: Secondary | ICD-10-CM

## 2024-01-04 DIAGNOSIS — E039 Hypothyroidism, unspecified: Secondary | ICD-10-CM

## 2024-01-04 DIAGNOSIS — E559 Vitamin D deficiency, unspecified: Secondary | ICD-10-CM

## 2024-01-04 DIAGNOSIS — I480 Paroxysmal atrial fibrillation: Secondary | ICD-10-CM

## 2024-01-05 LAB — CMP14+EGFR
ALT: 39 IU/L — ABNORMAL HIGH (ref 0–32)
AST: 37 IU/L (ref 0–40)
Albumin: 4.2 g/dL (ref 3.9–4.9)
Alkaline Phosphatase: 122 IU/L (ref 49–135)
BUN/Creatinine Ratio: 12 (ref 12–28)
BUN: 12 mg/dL (ref 8–27)
Bilirubin Total: 0.4 mg/dL (ref 0.0–1.2)
CO2: 24 mmol/L (ref 20–29)
Calcium: 9.1 mg/dL (ref 8.7–10.3)
Chloride: 104 mmol/L (ref 96–106)
Creatinine, Ser: 1.04 mg/dL — ABNORMAL HIGH (ref 0.57–1.00)
Globulin, Total: 2.2 g/dL (ref 1.5–4.5)
Glucose: 97 mg/dL (ref 70–99)
Potassium: 4.5 mmol/L (ref 3.5–5.2)
Sodium: 142 mmol/L (ref 134–144)
Total Protein: 6.4 g/dL (ref 6.0–8.5)
eGFR: 58 mL/min/1.73 — ABNORMAL LOW

## 2024-01-05 LAB — CBC WITH DIFFERENTIAL/PLATELET
Basophils Absolute: 0.1 x10E3/uL (ref 0.0–0.2)
Basos: 1 %
EOS (ABSOLUTE): 0.1 x10E3/uL (ref 0.0–0.4)
Eos: 1 %
Hematocrit: 41.4 % (ref 34.0–46.6)
Hemoglobin: 13.2 g/dL (ref 11.1–15.9)
Immature Grans (Abs): 0.1 x10E3/uL (ref 0.0–0.1)
Immature Granulocytes: 1 %
Lymphocytes Absolute: 1.7 x10E3/uL (ref 0.7–3.1)
Lymphs: 18 %
MCH: 30.2 pg (ref 26.6–33.0)
MCHC: 31.9 g/dL (ref 31.5–35.7)
MCV: 95 fL (ref 79–97)
Monocytes Absolute: 0.6 x10E3/uL (ref 0.1–0.9)
Monocytes: 6 %
Neutrophils Absolute: 6.9 x10E3/uL (ref 1.4–7.0)
Neutrophils: 73 %
Platelets: 175 x10E3/uL (ref 150–450)
RBC: 4.37 x10E6/uL (ref 3.77–5.28)
RDW: 13.8 % (ref 11.7–15.4)
WBC: 9.4 x10E3/uL (ref 3.4–10.8)

## 2024-01-05 LAB — HEMOGLOBIN A1C
Est. average glucose Bld gHb Est-mCnc: 117 mg/dL
Hgb A1c MFr Bld: 5.7 % — ABNORMAL HIGH (ref 4.8–5.6)

## 2024-01-05 LAB — LIPID PANEL
Chol/HDL Ratio: 2.8 ratio (ref 0.0–4.4)
Cholesterol, Total: 129 mg/dL (ref 100–199)
HDL: 46 mg/dL
LDL Chol Calc (NIH): 58 mg/dL (ref 0–99)
Triglycerides: 145 mg/dL (ref 0–149)
VLDL Cholesterol Cal: 25 mg/dL (ref 5–40)

## 2024-01-05 LAB — VITAMIN D 25 HYDROXY (VIT D DEFICIENCY, FRACTURES): Vit D, 25-Hydroxy: 24.1 ng/mL — ABNORMAL LOW (ref 30.0–100.0)

## 2024-01-05 LAB — VITAMIN B12: Vitamin B-12: 355 pg/mL (ref 232–1245)

## 2024-01-05 LAB — TSH: TSH: 3.12 u[IU]/mL (ref 0.450–4.500)

## 2024-01-23 ENCOUNTER — Encounter: Payer: Self-pay | Admitting: Family

## 2024-01-23 ENCOUNTER — Other Ambulatory Visit: Payer: Self-pay | Admitting: Family

## 2024-01-24 ENCOUNTER — Other Ambulatory Visit: Payer: Self-pay

## 2024-01-30 ENCOUNTER — Other Ambulatory Visit: Payer: Self-pay | Admitting: Cardiovascular Disease

## 2024-01-31 ENCOUNTER — Ambulatory Visit: Payer: Self-pay

## 2024-05-05 ENCOUNTER — Ambulatory Visit: Admitting: Family

## 2024-08-07 ENCOUNTER — Other Ambulatory Visit

## 2024-08-07 ENCOUNTER — Ambulatory Visit: Admitting: Internal Medicine

## 2024-08-07 ENCOUNTER — Ambulatory Visit
